# Patient Record
Sex: Female | Born: 1960
Health system: Southern US, Community
[De-identification: ages and names within clinical notes are randomized; demographics above are authoritative.]

## PROBLEM LIST (undated history)

## (undated) DIAGNOSIS — J45909 Unspecified asthma, uncomplicated: Secondary | ICD-10-CM

## (undated) DIAGNOSIS — G43909 Migraine, unspecified, not intractable, without status migrainosus: Secondary | ICD-10-CM

## (undated) DIAGNOSIS — E785 Hyperlipidemia, unspecified: Secondary | ICD-10-CM

## (undated) DIAGNOSIS — T7840XA Allergy, unspecified, initial encounter: Secondary | ICD-10-CM

## (undated) DIAGNOSIS — E119 Type 2 diabetes mellitus without complications: Secondary | ICD-10-CM

## (undated) HISTORY — DX: Migraine, unspecified, not intractable, without status migrainosus: G43.909

## (undated) HISTORY — DX: Unspecified asthma, uncomplicated: J45.909

## (undated) HISTORY — DX: Hyperlipidemia, unspecified: E78.5

## (undated) HISTORY — DX: Allergy, unspecified, initial encounter: T78.40XA

---

## 1989-08-04 HISTORY — PX: HERNIA REPAIR: SHX51

## 2005-04-29 ENCOUNTER — Emergency Department: Payer: Self-pay | Admitting: Emergency Medicine

## 2005-07-17 ENCOUNTER — Ambulatory Visit: Payer: Self-pay | Admitting: Family Medicine

## 2007-11-03 ENCOUNTER — Ambulatory Visit: Payer: Self-pay | Admitting: Family Medicine

## 2008-04-18 ENCOUNTER — Ambulatory Visit: Payer: Self-pay

## 2008-12-06 ENCOUNTER — Ambulatory Visit: Payer: Self-pay | Admitting: Family Medicine

## 2009-11-12 LAB — HM PAP SMEAR: HM Pap smear: NORMAL

## 2009-12-11 ENCOUNTER — Ambulatory Visit: Payer: Self-pay | Admitting: Family Medicine

## 2010-12-17 ENCOUNTER — Ambulatory Visit: Payer: Self-pay | Admitting: Family Medicine

## 2011-08-16 LAB — HM MAMMOGRAPHY: HM Mammogram: NORMAL

## 2011-11-07 LAB — HM PAP SMEAR: HM Pap smear: NORMAL

## 2012-04-07 LAB — FECAL OCCULT BLOOD, GUAIAC: Fecal Occult Blood: NEGATIVE

## 2012-09-07 ENCOUNTER — Encounter: Payer: Self-pay | Admitting: Internal Medicine

## 2012-09-07 ENCOUNTER — Ambulatory Visit (INDEPENDENT_AMBULATORY_CARE_PROVIDER_SITE_OTHER): Payer: BC Managed Care – PPO | Admitting: Internal Medicine

## 2012-09-07 VITALS — BP 132/86 | HR 97 | Temp 97.9°F | Resp 16 | Ht 68.25 in | Wt 236.5 lb

## 2012-09-07 DIAGNOSIS — E669 Obesity, unspecified: Secondary | ICD-10-CM

## 2012-09-07 DIAGNOSIS — J309 Allergic rhinitis, unspecified: Secondary | ICD-10-CM

## 2012-09-07 DIAGNOSIS — Z1239 Encounter for other screening for malignant neoplasm of breast: Secondary | ICD-10-CM

## 2012-09-07 DIAGNOSIS — Z8049 Family history of malignant neoplasm of other genital organs: Secondary | ICD-10-CM

## 2012-09-07 DIAGNOSIS — E785 Hyperlipidemia, unspecified: Secondary | ICD-10-CM

## 2012-09-07 DIAGNOSIS — Z8709 Personal history of other diseases of the respiratory system: Secondary | ICD-10-CM

## 2012-09-07 MED ORDER — BUTALBITAL-APAP-CAFFEINE 50-325-40 MG PO TABS: 1.0000 | ORAL_TABLET | Freq: Four times a day (QID) | ORAL | Status: DC | PRN

## 2012-09-07 MED ORDER — BUTALBITAL-APAP-CAFFEINE 50-325-40 MG PO TABS: 1.0000 | ORAL_TABLET | Freq: Four times a day (QID) | ORAL | Status: AC | PRN

## 2012-09-07 NOTE — Progress Notes (Signed)
Patient ID: Rachel Riggs, female   DOB: 15-Jul-1961, 52 y.o.   MRN: [id number]  Patient Active Problem List  Diagnosis  . History of asthma  . Hyperlipidemia  . Obesity  . Family history of cervical cancer    Subjective:  CC:   Chief Complaint  Patient presents with  . Establish Care    HPI:    Rachel Riggs is a 52 y.o. female who presents as a new patient to establish primary care with several issues to discuss including 1) dizziness ,  Sinus congestion and allergies lately.  Occurring when she is lying down and tilting head back, with acutal vertigo described and Had vertigo.  Started zyrtec  2 weeks .  Last epidsosse 2 years ago resolved with zurtec  2) Headaches for the past week:   frontal headache,  Nausea,  light sensitivity .typical occurrence i 3/month.  History of hives as well.  Headaches occur 3 days before period.   3) wt gain,  Highest was 248 in 2007.  nadir 215 and weight loss was achieved with and exercise regimen 7 days a week and it hurts to weight watchers diet. Currently she not exercising not following her diet. She is working 7 days per week.  Health maintenance: Former patient of Dr Rachel Riggs .  Had an annual exam last year by Rachel Riggs her former PCP .  Mammogram is due.   Last PAP was April or Sept 2014.  No history of recent abnormals.  Remote colposcopy for erosion but biopsy was normal.  Mom was just diagnosed with cervical CA at age 31 .  Past Medical History  Diagnosis Date  . Asthma   . Hyperlipidemia   . Migraines     Past Surgical History  Procedure Date  . Hernia repair 1991    Family History  Problem Relation Age of Onset  . Parkinsonism Mother 57  . Cancer Mother     cervical ca  . Hypertension Father   . Hyperlipidemia Father   . Drug abuse Father   . Cancer Brother 26    prostate CA  . Diabetes Maternal Grandmother   . Stroke Maternal Grandmother   . Heart disease Paternal Grandfather   . COPD Paternal Grandfather      History   Social History  . Marital Status: Married    Spouse Name: N/A    Number of Children: N/A  . Years of Education: N/A   Occupational History  . Not on file.   Social History Main Topics  . Smoking status: Former Smoker    Quit date: 08/04/1984  . Smokeless tobacco: Not on file  . Alcohol Use: 0.6 oz/week    1 Glasses of wine per week  . Drug Use: No  . Sexually Active:    Other Topics Concern  . Not on file   Social History Narrative  . No narrative on file   Allergies  Allergen Reactions  . Amoxicillin Anaphylaxis  . Aspirin Anaphylaxis  . Ibuprofen Anaphylaxis  . Penicillins Anaphylaxis  . Sulfa Antibiotics Anaphylaxis    Review of Systems:   Patient denies , fevers, malaise, unintentional weight loss, skin rash, eye pain, sinus congestion and sinus pain, sore throat, dysphagia,  hemoptysis , cough, dyspnea, wheezing, chest pain, palpitations, orthopnea, edema, abdominal pain, nausea, melena, diarrhea, constipation, flank pain, dysuria, hematuria, urinary  Frequency, nocturia, numbness, tingling, seizures,  Focal weakness, Loss of consciousness,  Tremor, insomnia, depression, anxiety, and suicidal ideation.  Objective:  BP 132/86  Pulse 97  Temp 97.9 F (36.6 C) (Oral)  Resp 16  Ht 5' 8.25" (1.734 m)  Wt 236 lb 8 oz (107.276 kg)  BMI 35.70 kg/m2  SpO2 97%  LMP 08/24/2012  General appearance: alert, cooperative and appears stated age Ears: normal TM's and external ear canals both ears Throat: lips, mucosa, and tongue normal; teeth and gums normal Neck: no adenopathy, no carotid bruit, supple, symmetrical, trachea midline and thyroid not enlarged, symmetric, no tenderness/mass/nodules Back: symmetric, no curvature. ROM normal. No CVA tenderness. Lungs: clear to auscultation bilaterally Heart: regular rate and rhythm, S1, S2 normal, no murmur, click, rub or gallop Abdomen: soft, non-tender; bowel sounds normal; no masses,  no  organomegaly Pulses: 2+ and symmetric Skin: Skin color, texture, turgor normal. No rashes or lesions Lymph nodes: Cervical, supraclavicular, and axillary nodes normal.  Assessment and Plan:  Family history of cervical cancer She is considered high risk due to her personal remote history of cervical erosions and her family history cervical cancer in first-degree relatives. We will continue annual Pap smears. Awaiting results from  Obesity I have addressed  BMI and recommended a low glycemic index diet utilizing smaller more frequent meals to increase metabolism.  I have also recommended that patient start exercising with a goal of 30 minutes of aerobic exercise a minimum of 5 days per week. Screening for lipid disorders, thyroid and diabetes to be Riggs today.    Hyperlipidemia Managed with pravastatin. Goal is LDL less than 1:30. She will return for fasting lipids and CMET  Allergic rhinitis Her symptoms of headache and dizziness in the settting of sinus congestion and history of allergic rhinitis with recent lapse in use of antihistamines appear to be benign in nature.  I agree with her self assessment. Neurologic exam is normal    Updated Medication List Outpatient Encounter Prescriptions as of 09/07/2012  Medication Sig Dispense Refill  . butalbital-acetaminophen-caffeine (FIORICET) 50-325-40 MG per tablet Take 1-2 tablets by mouth every 6 (six) hours as needed for headache.  90 tablet  1  . etodolac (LODINE) 500 MG tablet Take 500 mg by mouth 2 (two) times daily.      Marland Kitchen MICROGESTIN 1-20 MG-MCG tablet Take 1 tablet by mouth Daily.      . pravastatin (PRAVACHOL) 40 MG tablet Take 1 tablet by mouth Daily.      . VENTOLIN HFA 108 (90 BASE) MCG/ACT inhaler Take 2 puffs by mouth Every 6 hours as needed.      . [DISCONTINUED] butalbital-acetaminophen-caffeine (FIORICET) 50-325-40 MG per tablet Take 1-2 tablets by mouth every 6 (six) hours as needed for headache.  90 tablet  1

## 2012-09-07 NOTE — Patient Instructions (Addendum)
Return at your leisure for fasting labs once we call you .   You can lose 10%  Of your current body weight over the next 6 months   This is  my version of a  "Low GI"  Diet:  It is not ultra low carb, but will still lower your blood sugars and allow you to lose 5 to 10 lbs per month if you follow it carefully. All of the foods can be found at grocery stores and in bulk at [employer].  The Atkins protein bars and shakes are available in more varieties at [employer], [employer] and [employer].     7 AM Breakfast:  Low carbohydrate Protein  Shakes (I recommend the EAS AdvantEdge "Carb Control" shakes  Or the low carb shakes by Atkins.   Both are available everywhere:  In  cases at [employer]Js  Or in 4 packs at grocery stores and pharmacies  2.5 carbs  (Alternative is  a toasted Arnold's Sandwhich Thin w/ peanut butter, a "Bagel Thin" with cream cheese and salmon) or  a scrambled egg burrito made with a low carb tortilla .  Avoid cereal and bananas, oatmeal too unless you are cooking the old fashioned kind that takes 30-40 minutes to prepare.  the rest is overly processed, has minimal fiber, and is loaded with carbohydrates!   10 AM: Protein bar by Atkins (the snack size, under 200 cal).  There are many varieties , available widely again or in bulk in limited varieties at BJs)  Other so called "protein bars" tend to be loaded with carbohydrates.  Remember, in food advertising, the word "energy" is synonymous for " carbohydrate."  Lunch: sandwich of [place], (or any lunchmeat, grilled meat or canned tuna), fresh avocado, mayonnaise  and cheese on a lower carbohydrate pita bread, flatbread, or tortilla . Ok to use regular mayonnaise. The bread is the only source or carbohydrate that can be decreased (Joseph's makes a pita bread and a flat bread that are 50 cal and 4 net carbs ; Toufayan makes a low carb flatbread that's 100 cal and 9 net carbs  and  Mission makes a low carb whole wheat tortilla  That is 210 cal and 6 net  carbs)  3 PM:  Mid day :  Another protein bar,  Or a  cheese stick (100 cal, 0 carbs),  Or 1 ounce of  almonds, walnuts, pistachios, pecans, peanuts,  Macadamia nuts. Or a Dannon light n Fit greek yogurt, 80 cal 8 net carbs . Avoid "granola"; the dried cranberries and raisins are loaded with carbohydrates. Mixed nuts ok if no raisins or cranberries or dried fruit.      6 PM  Dinner:  "mean and green:"  Meat/chicken/fish or a high protein legume; , with a green salad, and a low GI  Veggie (broccoli, cauliflower, green beans, spinach, brussel sprouts. Lima beans) : Avoid "Low fat dressings, as well as Reyne Dumas and [address]! They are loaded with sugar! Instead use ranch, vinagrette,  Blue cheese, etc.  There is a low carb pasta by Dreamfield's available at [employer] [facility] that is acceptable and tastes great. Try Michel Angel's chicken piccata over low carb pasta. The chicken dish is 0 carbs, and can be found in frozen section at BJs and [employer]. Also try [employer] "Carnitas" (pulled pork, no sauce,  0 carbs) and his pot roast.   both are in the refrigerated section at BJs   Dreamfield's makes a low carb pasta only  5 g/serving.  Available at all grocery stores,  And tastes like normal pasta  9 PM snack : Breyer's "low carb" fudgsicle or  ice cream bar (Carb Smart line), or  Weight Watcher's ice cream bar , or another "no sugar added" ice cream;a serving of fresh berries/cherries with whipped cream (Avoid bananas, pineapple, grapes  and watermelon on a regular basis because they are high in sugar)   Remember that snack Substitutions should be less than 10 carbs per serving and meals < 20 carbs. Remember to subtract fiber grams and sugar alcohols to get the "net carbs."

## 2012-09-08 ENCOUNTER — Encounter: Payer: Self-pay | Admitting: Internal Medicine

## 2012-09-08 DIAGNOSIS — J309 Allergic rhinitis, unspecified: Secondary | ICD-10-CM | POA: Insufficient documentation

## 2012-09-08 NOTE — Assessment & Plan Note (Signed)
I have addressed  BMI and recommended a low glycemic index diet utilizing smaller more frequent meals to increase metabolism.  I have also recommended that patient start exercising with a goal of 30 minutes of aerobic exercise a minimum of 5 days per week. Screening for lipid disorders, thyroid and diabetes to be done today.   

## 2012-09-08 NOTE — Assessment & Plan Note (Addendum)
Managed with pravastatin. Goal is LDL less than 1:30. She will return for fasting lipids and CMET

## 2012-09-08 NOTE — Assessment & Plan Note (Signed)
Her symptoms of headache and dizziness in the settting of sinus congestion and history of allergic rhinitis with recent lapse in use of antihistamines appear to be benign in nature.  I agree with her self assessment. Neurologic exam is normal

## 2012-09-08 NOTE — Assessment & Plan Note (Signed)
She is considered high risk due to her personal remote history of cervical erosions and her family history cervical cancer in first-degree relatives. We will continue annual Pap smears. Awaiting results from

## 2012-09-09 ENCOUNTER — Other Ambulatory Visit: Payer: Self-pay | Admitting: General Practice

## 2012-09-09 MED ORDER — PRAVASTATIN SODIUM 40 MG PO TABS: ORAL_TABLET | ORAL | Status: DC

## 2012-09-24 ENCOUNTER — Encounter: Payer: Self-pay | Admitting: Internal Medicine

## 2012-09-24 ENCOUNTER — Encounter: Payer: Self-pay | Admitting: General Practice

## 2012-09-28 ENCOUNTER — Ambulatory Visit: Payer: Self-pay | Admitting: Internal Medicine

## 2012-09-29 ENCOUNTER — Other Ambulatory Visit: Payer: BC Managed Care – PPO

## 2012-10-04 ENCOUNTER — Encounter: Payer: Self-pay | Admitting: Internal Medicine

## 2012-10-06 ENCOUNTER — Telehealth: Payer: Self-pay | Admitting: *Deleted

## 2012-10-06 ENCOUNTER — Encounter: Payer: Self-pay | Admitting: Internal Medicine

## 2012-10-06 DIAGNOSIS — E785 Hyperlipidemia, unspecified: Secondary | ICD-10-CM

## 2012-10-06 DIAGNOSIS — Z124 Encounter for screening for malignant neoplasm of cervix: Secondary | ICD-10-CM | POA: Insufficient documentation

## 2012-10-06 DIAGNOSIS — R5381 Other malaise: Secondary | ICD-10-CM

## 2012-10-06 DIAGNOSIS — R5383 Other fatigue: Secondary | ICD-10-CM

## 2012-10-06 DIAGNOSIS — E669 Obesity, unspecified: Secondary | ICD-10-CM

## 2012-10-06 NOTE — Telephone Encounter (Signed)
Pt is coming in tomorrow 03.06.2014 for labs, what labs and dx would you like? Thank you  

## 2012-10-07 ENCOUNTER — Other Ambulatory Visit (INDEPENDENT_AMBULATORY_CARE_PROVIDER_SITE_OTHER): Payer: BC Managed Care – PPO

## 2012-10-07 DIAGNOSIS — R5381 Other malaise: Secondary | ICD-10-CM

## 2012-10-07 DIAGNOSIS — R5383 Other fatigue: Secondary | ICD-10-CM

## 2012-10-07 DIAGNOSIS — E785 Hyperlipidemia, unspecified: Secondary | ICD-10-CM

## 2012-10-07 LAB — COMPREHENSIVE METABOLIC PANEL
ALT: 25 U/L (ref 0–35)
AST: 25 U/L (ref 0–37)
Albumin: 3.7 g/dL (ref 3.5–5.2)
Alkaline Phosphatase: 121 U/L — ABNORMAL HIGH (ref 39–117)
BUN: 11 mg/dL (ref 6–23)
Calcium: 9.4 mg/dL (ref 8.4–10.5)
Chloride: 103 mEq/L (ref 96–112)
Potassium: 4.4 mEq/L (ref 3.5–5.1)

## 2012-10-07 LAB — CBC WITH DIFFERENTIAL/PLATELET
Basophils Relative: 0.4 % (ref 0.0–3.0)
Eosinophils Absolute: 0.2 10*3/uL (ref 0.0–0.7)
Eosinophils Relative: 2.4 % (ref 0.0–5.0)
HCT: 39.3 % (ref 36.0–46.0)
Lymphs Abs: 3 10*3/uL (ref 0.7–4.0)
MCHC: 33.5 g/dL (ref 30.0–36.0)
MCV: 83.5 fl (ref 78.0–100.0)
Monocytes Absolute: 0.5 10*3/uL (ref 0.1–1.0)
Neutrophils Relative %: 57.1 % (ref 43.0–77.0)
RBC: 4.71 Mil/uL (ref 3.87–5.11)
WBC: 8.8 10*3/uL (ref 4.5–10.5)

## 2012-10-07 LAB — LIPID PANEL: Total CHOL/HDL Ratio: 4

## 2012-10-07 LAB — LDL CHOLESTEROL, DIRECT: Direct LDL: 138.8 mg/dL

## 2012-10-13 ENCOUNTER — Encounter: Payer: Self-pay | Admitting: Internal Medicine

## 2012-10-13 ENCOUNTER — Ambulatory Visit (INDEPENDENT_AMBULATORY_CARE_PROVIDER_SITE_OTHER): Payer: BC Managed Care – PPO | Admitting: Internal Medicine

## 2012-10-13 VITALS — BP 130/82 | HR 94 | Temp 98.4°F | Resp 16 | Ht 68.0 in | Wt 229.8 lb

## 2012-10-13 DIAGNOSIS — Z1211 Encounter for screening for malignant neoplasm of colon: Secondary | ICD-10-CM

## 2012-10-13 DIAGNOSIS — Z0000 Encounter for general adult medical examination without abnormal findings: Secondary | ICD-10-CM | POA: Insufficient documentation

## 2012-10-13 DIAGNOSIS — E669 Obesity, unspecified: Secondary | ICD-10-CM

## 2012-10-13 DIAGNOSIS — E785 Hyperlipidemia, unspecified: Secondary | ICD-10-CM

## 2012-10-13 NOTE — Assessment & Plan Note (Addendum)
improved with 8 lbs in one month on low gi diet.   She is thrilled as she has been unable to  Lose weight this easily in years.  Counselling given,

## 2012-10-13 NOTE — Progress Notes (Signed)
Patient ID: Rachel Riggs, female   DOB: [birth year], 52 y.o.   MRN: [id number]    Subjective:     Rachel Riggs is a 52 y.o. female and is here for a comprehensive physical exam. The patient reports no problems.  History   Social History  . Marital Status: Married    Spouse Name: N/A    Number of Children: N/A  . Years of Education: N/A   Occupational History  . Not on file.   Social History Main Topics  . Smoking status: Former Smoker    Quit date: 08/04/1984  . Smokeless tobacco: Never Used  . Alcohol Use: 0.6 oz/week    1 Glasses of wine per week  . Drug Use: No  . Sexually Active: Not on file   Other Topics Concern  . Not on file   Social History Narrative  . No narrative on file   Health Maintenance  Topic Date Due  . Colonoscopy  09/09/2010  . Influenza Vaccine  04/04/2013  . Mammogram  08/15/2013  . Pap Smear  11/07/2014  . Tetanus/tdap  11/18/2020    The following portions of the patient's history were reviewed and updated as appropriate: allergies, current medications, past family history, past medical history, past social history, past surgical history and problem list.  Review of Systems A comprehensive review of systems was negative.   Objective:   BP 130/82  Pulse 94  Temp(Src) 98.4 F (36.9 C) (Oral)  Resp 16  Ht 5\' 8"  (1.727 m)  Wt 229 lb 12 oz (104.214 kg)  BMI 34.94 kg/m2  SpO2 97%  LMP 09/15/2012  General Appearance:    Alert, cooperative, no distress, appears stated age  Head:    Normocephalic, without obvious abnormality, atraumatic  Eyes:    PERRL, conjunctiva/corneas clear, EOM's intact, fundi    benign, both eyes  Ears:    Normal TM's and external ear canals, both ears  Nose:   Nares normal, septum midline, mucosa normal, no drainage    or sinus tenderness  Throat:   Lips, mucosa, and tongue normal; teeth and gums normal  Neck:   Supple, symmetrical, trachea midline, no adenopathy;    thyroid:  no  enlargement/tenderness/nodules; no carotid   bruit or JVD  Back:     Symmetric, no curvature, ROM normal, no CVA tenderness  Lungs:     Clear to auscultation bilaterally, respirations unlabored  Chest Wall:    No tenderness or deformity   Heart:    Regular rate and rhythm, S1 and S2 normal, no murmur, rub   or gallop  Breast Exam:    No tenderness, masses, or nipple abnormality  Abdomen:     Soft, non-tender, bowel sounds active all four quadrants,    no masses, no organomegaly  Genitalia:    Pelvic: cervix normal in appearance, external genitalia normal, no adnexal masses or tenderness, no cervical motion tenderness, rectovaginal septum normal, uterus normal size, shape, and consistency and vagina normal without discharge  Extremities:   Extremities normal, atraumatic, no cyanosis or edema  Pulses:   2+ and symmetric all extremities  Skin:   Skin color, texture, turgor normal, no rashes or lesions  Lymph nodes:   Cervical, supraclavicular, and axillary nodes normal  Neurologic:   CNII-XII intact, normal strength, sensation and reflexes    throughout      Assessment:      Hyperlipidemia LDL 138 trigs 206,  Repeat in  6 months, continue statin  Obesity improved with 8 lbs in one month on low gi diet.   She is thrilled as she has been unable to  Lose weight this easily in years.  Counselling given,   Routine general medical examination at a health care facility Annual exam including breast , and pelvic were done today with PAP smear as she had a normal PAP apil 2013 , will repeat next year     Updated Medication List Outpatient Encounter Prescriptions as of 10/13/2012  Medication Sig Dispense Refill  . butalbital-acetaminophen-caffeine (FIORICET) 50-325-40 MG per tablet Take 1-2 tablets by mouth every 6 (six) hours as needed for headache.  90 tablet  1  . cetirizine (ZYRTEC) 10 MG tablet Take 10 mg by mouth daily as needed for allergies.      Marland Kitchen etodolac (LODINE) 500 MG tablet  Take 500 mg by mouth 2 (two) times daily.      Marland Kitchen MICROGESTIN 1-20 MG-MCG tablet Take 1 tablet by mouth Daily.      . pravastatin (PRAVACHOL) 40 MG tablet Take 1 tablet by mouth Daily.  30 tablet  6  . VENTOLIN HFA 108 (90 BASE) MCG/ACT inhaler Take 2 puffs by mouth Every 6 hours as needed.       No facility-administered encounter medications on file as of 10/13/2012.

## 2012-10-13 NOTE — Assessment & Plan Note (Addendum)
LDL 138 trigs 206,  Repeat in  6 months, continue statin

## 2012-10-14 ENCOUNTER — Encounter: Payer: Self-pay | Admitting: Internal Medicine

## 2012-10-14 NOTE — Assessment & Plan Note (Signed)
Annual exam including breast , and pelvic were done today with PAP smear as she had a normal PAP apil 2013 , will repeat next year

## 2012-10-21 ENCOUNTER — Other Ambulatory Visit: Payer: BC Managed Care – PPO

## 2012-11-09 ENCOUNTER — Encounter: Payer: Self-pay | Admitting: Internal Medicine

## 2012-11-09 MED ORDER — NORETHINDRONE ACET-ETHINYL EST 1-20 MG-MCG PO TABS: 1.0000 | ORAL_TABLET | Freq: Every day | ORAL | Status: DC

## 2012-11-09 NOTE — Telephone Encounter (Signed)
Med filled.  

## 2012-12-23 ENCOUNTER — Telehealth: Payer: Self-pay | Admitting: Internal Medicine

## 2012-12-23 MED ORDER — MECLIZINE HCL 25 MG PO TABS: 25.0000 mg | ORAL_TABLET | Freq: Three times a day (TID) | ORAL | Status: DC | PRN

## 2012-12-23 NOTE — Telephone Encounter (Signed)
Patient having a problem with vertigo and she is wanting something called into the pharmacy. The patient is going out of town and tomorrow and she will follow up with Dr. Darrick Huntsman when she returns.

## 2012-12-23 NOTE — Telephone Encounter (Signed)
Patient notified

## 2012-12-23 NOTE — Telephone Encounter (Signed)
Meclizine setn to pharmacy

## 2012-12-23 NOTE — Telephone Encounter (Signed)
Please advise last OV 10/13/12

## 2013-01-06 ENCOUNTER — Telehealth: Payer: Self-pay | Admitting: *Deleted

## 2013-01-06 NOTE — Telephone Encounter (Signed)
Tell her I am very sorry but I Can't do it.  I cannot prescribe medications for I patient I have never seen.  It will not hurt her daughter to be off of birth control for 12 days .  If she would like to come in on the 16th for labs so we will have them back on the 17th  That would expedite things.  She will need a CMET , TSH and cbc w diff. I cannot order them bc I do not know patient's name.

## 2013-01-06 NOTE — Telephone Encounter (Signed)
Not calling for self but for daughter whom has appointment to be established with you on 01/18/13. Patient reports daughter will run out of birth control before that time and old office will not refill with out physical, patient stated daughter is taking Microgestin since middle school for period regulation, and needs one month supply called into [facility] [address] if possible. Daughter name Rachel Riggs. DOB 04/05/1989 please advise.

## 2013-01-07 NOTE — Telephone Encounter (Signed)
Patient notified and she will wait until her OV

## 2013-01-07 NOTE — Telephone Encounter (Signed)
Left message for patient to call office.  

## 2013-01-10 ENCOUNTER — Ambulatory Visit (INDEPENDENT_AMBULATORY_CARE_PROVIDER_SITE_OTHER): Payer: BC Managed Care – PPO | Admitting: Adult Health

## 2013-01-10 ENCOUNTER — Encounter: Payer: Self-pay | Admitting: Adult Health

## 2013-01-10 VITALS — BP 130/72 | HR 98 | Temp 98.6°F | Resp 12 | Wt 237.5 lb

## 2013-01-10 DIAGNOSIS — J019 Acute sinusitis, unspecified: Secondary | ICD-10-CM | POA: Insufficient documentation

## 2013-01-10 DIAGNOSIS — J329 Chronic sinusitis, unspecified: Secondary | ICD-10-CM

## 2013-01-10 MED ORDER — AZITHROMYCIN 250 MG PO TABS: ORAL_TABLET | ORAL | Status: DC

## 2013-01-10 NOTE — Assessment & Plan Note (Signed)
Symptoms ongoing for greater than one week now with green secretions. Start azithromycin. May use Afrin x3 days for severe congestion. RTC if symptoms are not improved within 3-4 days.

## 2013-01-10 NOTE — Progress Notes (Signed)
  Subjective:    Patient ID: Rachel Riggs, female    DOB: 07-02-1961, 52 y.o.   MRN: [id number]  HPI  Patient is a pleasant 52 y/o female who presents to clinic with sinus symptoms. Symptoms began 1 week ago. Headache, jaw pain, sore throat, cough, green secretions. She has taken robitussin, nyquil with not much improvement. Denies fever or chills.   Current Outpatient Prescriptions on File Prior to Visit  Medication Sig Dispense Refill  . butalbital-acetaminophen-caffeine (FIORICET) 50-325-40 MG per tablet Take 1-2 tablets by mouth every 6 (six) hours as needed for headache.  90 tablet  1  . cetirizine (ZYRTEC) 10 MG tablet Take 10 mg by mouth daily as needed for allergies.      Marland Kitchen etodolac (LODINE) 500 MG tablet Take 500 mg by mouth 2 (two) times daily.      . meclizine (ANTIVERT) 25 MG tablet Take 1 tablet (25 mg total) by mouth 3 (three) times daily as needed.  30 tablet  0  . norethindrone-ethinyl estradiol (MICROGESTIN) 1-20 MG-MCG tablet Take 1 tablet by mouth daily.  1 Package  12  . pravastatin (PRAVACHOL) 40 MG tablet Take 1 tablet by mouth Daily.  30 tablet  6  . VENTOLIN HFA 108 (90 BASE) MCG/ACT inhaler Take 2 puffs by mouth Every 6 hours as needed.       No current facility-administered medications on file prior to visit.    Review of Systems  Constitutional: Negative for fever and chills.  HENT: Positive for congestion, sore throat, rhinorrhea, postnasal drip and sinus pressure.   Respiratory: Positive for cough. Negative for shortness of breath.   Gastrointestinal: Negative.    BP 130/72  Pulse 98  Temp(Src) 98.6 F (37 C) (Oral)  Resp 12  Wt 237 lb 8 oz (107.729 kg)  BMI 36.12 kg/m2  SpO2 98%     Objective:   Physical Exam  HENT:  Head: Normocephalic and atraumatic.  Right Ear: External ear normal.  Left Ear: External ear normal.  Mouth/Throat: No oropharyngeal exudate.  Pharyngeal erythema  Cardiovascular: Normal rate, regular rhythm and normal heart  sounds.  Exam reveals no gallop.   No murmur heard. Pulmonary/Chest: Effort normal and breath sounds normal. No respiratory distress. She has no wheezes. She has no rales.       Assessment & Plan:

## 2013-01-10 NOTE — Patient Instructions (Addendum)
  Start Azithromycin. Two tablets on the first day then 1 tablet daily for the next 4 days.  Drink plenty of fluids.  You can try Afrin for severe congestion for 3 days.

## 2013-01-16 IMAGING — MG MAM DGTL SCREENING MAMMO W/CAD
1 series · 4 of 4 positions shown · non-contrast
Comparison: 12/11/2009, 12/06/2008 and 11/03/2007.

REASON FOR EXAM: SCR MAMMO
COMMENTS:

PROCEDURE:     MAM - MAM DGTL SCREENING MAMMO W/CAD  - December 17, 2010  [DATE]
RESULT:

[R CC · right · 4 of 4 slices shown]
[im 1/4]
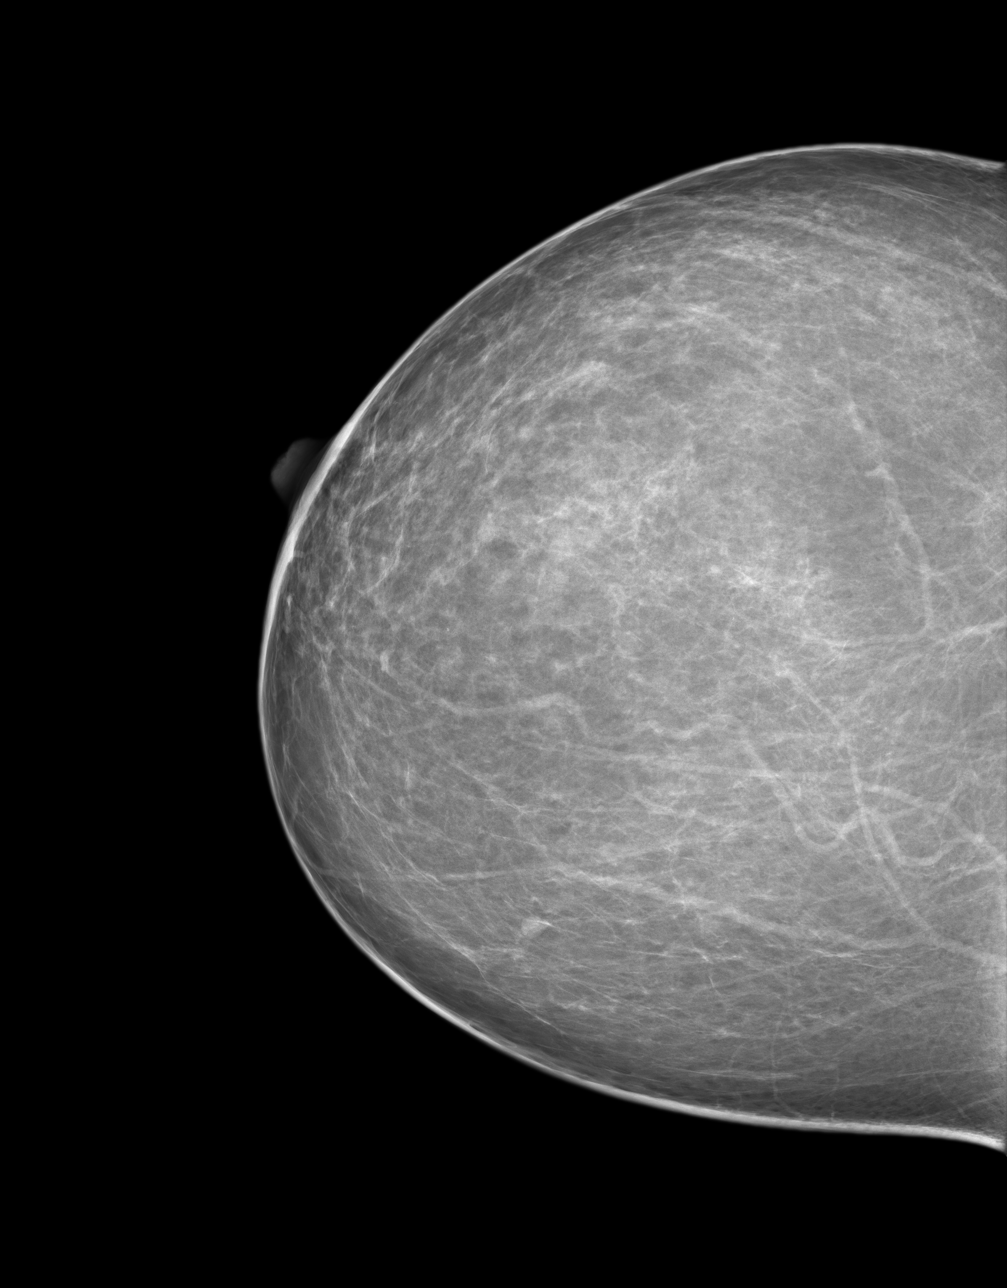
[im 2/4]
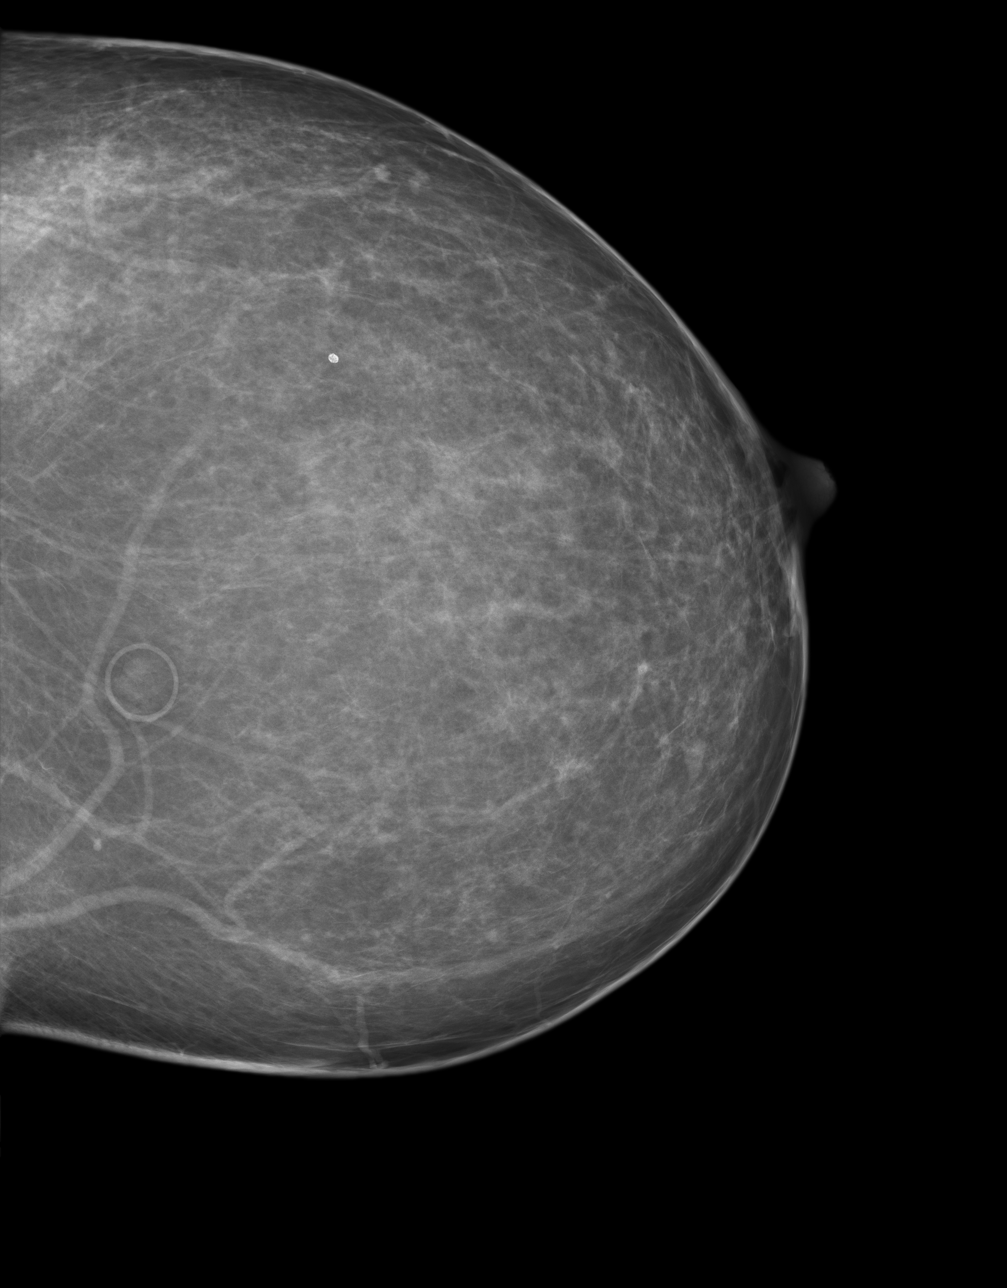
[im 3/4]
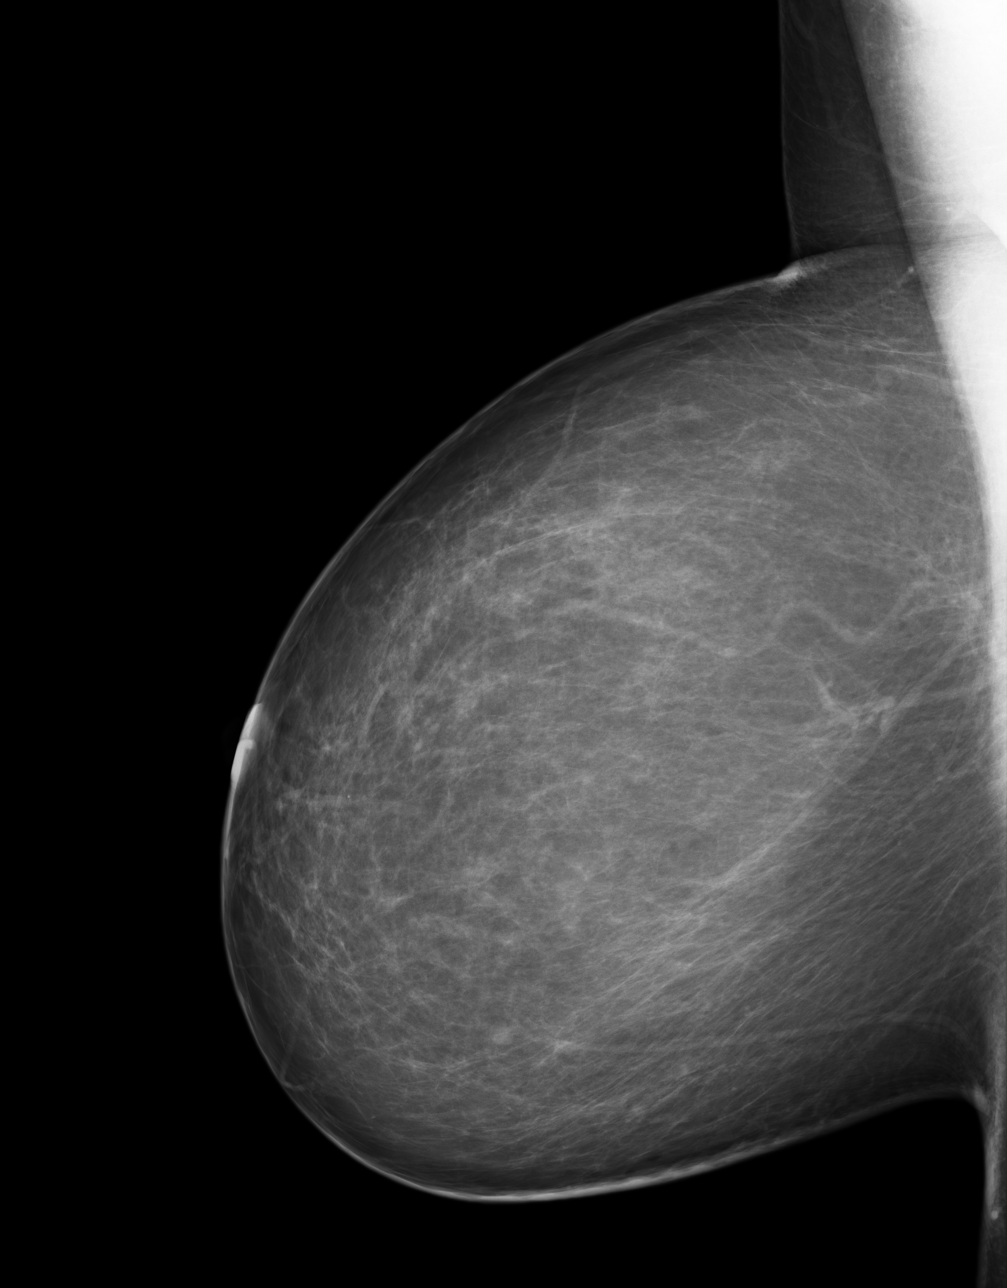
[im 4/4]
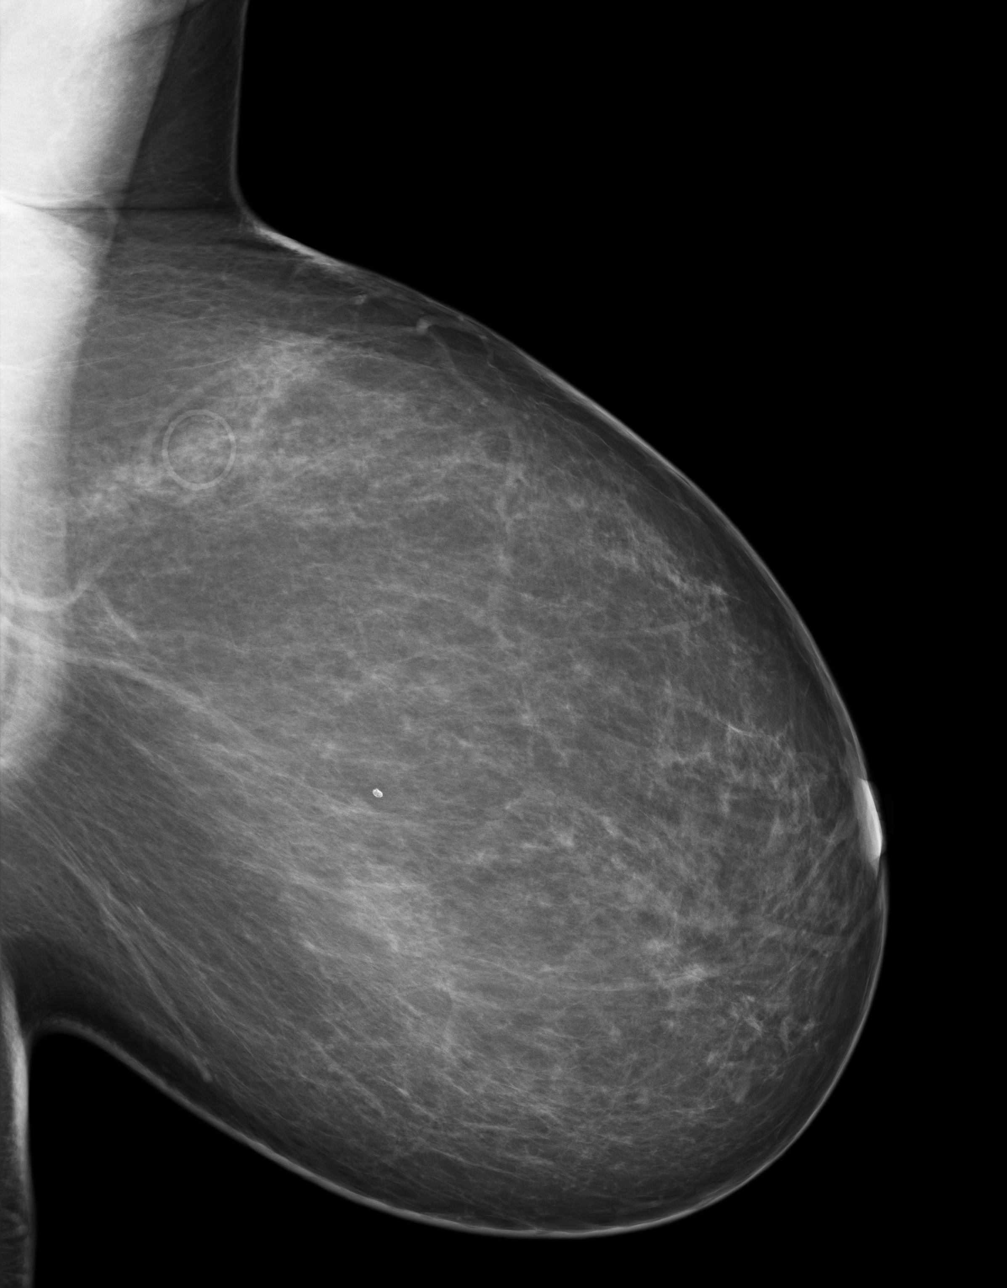

[4 of 4 positions shown; findings below may reference images not displayed]

FINDINGS: There is scattered fibroglandular tissue. No new or suspicious
masses or calcifications are identified. No areas of architectural
distortion.
IMPRESSION: BI-RADS: Category 1 - Negative

RECOMMENDATION:  Continued annual screening mammography.

Thank you for this opportunity to contribute to the care of your patient.

A NEGATIVE MAMMOGRAM REPORT DOES NOT PRECLUDE BIOPSY OR OTHER EVALUATION OF
A CLINICALLY PALPABLE OR OTHERWISE SUSPICIOUS MASS OR LESION. BREAST CANCER
MAY NOT BE DETECTED BY MAMMOGRAPHY IN UP TO 10% OF CASES.

## 2013-03-15 ENCOUNTER — Other Ambulatory Visit: Payer: Self-pay | Admitting: *Deleted

## 2013-03-16 MED ORDER — ALBUTEROL SULFATE HFA 108 (90 BASE) MCG/ACT IN AERS: 2.0000 | INHALATION_SPRAY | Freq: Four times a day (QID) | RESPIRATORY_TRACT | Status: DC | PRN

## 2013-04-12 ENCOUNTER — Telehealth: Payer: Self-pay | Admitting: *Deleted

## 2013-04-12 DIAGNOSIS — Z79899 Other long term (current) drug therapy: Secondary | ICD-10-CM

## 2013-04-12 DIAGNOSIS — E785 Hyperlipidemia, unspecified: Secondary | ICD-10-CM

## 2013-04-12 NOTE — Telephone Encounter (Signed)
Pt is coming in for labs work tomorrow , what labs and dx?

## 2013-04-13 ENCOUNTER — Other Ambulatory Visit (INDEPENDENT_AMBULATORY_CARE_PROVIDER_SITE_OTHER): Payer: BC Managed Care – PPO

## 2013-04-13 DIAGNOSIS — E785 Hyperlipidemia, unspecified: Secondary | ICD-10-CM

## 2013-04-13 DIAGNOSIS — Z79899 Other long term (current) drug therapy: Secondary | ICD-10-CM

## 2013-04-13 DIAGNOSIS — R7301 Impaired fasting glucose: Secondary | ICD-10-CM

## 2013-04-13 LAB — COMPREHENSIVE METABOLIC PANEL
Albumin: 3.4 g/dL — ABNORMAL LOW (ref 3.5–5.2)
Alkaline Phosphatase: 103 U/L (ref 39–117)
BUN: 12 mg/dL (ref 6–23)
CO2: 25 mEq/L (ref 19–32)
Calcium: 9.1 mg/dL (ref 8.4–10.5)
GFR: 103.33 mL/min (ref >60.00)
Glucose, Bld: 142 mg/dL — ABNORMAL HIGH (ref 70–99)
Potassium: 4.3 mEq/L (ref 3.5–5.1)

## 2013-04-13 LAB — LDL CHOLESTEROL, DIRECT: Direct LDL: 130.7 mg/dL

## 2013-04-13 LAB — LIPID PANEL: HDL: 59.3 mg/dL (ref >39.00)

## 2013-04-15 ENCOUNTER — Encounter: Payer: Self-pay | Admitting: Internal Medicine

## 2013-04-15 NOTE — Addendum Note (Signed)
Addended by: Sherlene Shams on: 04/15/2013 06:56 AM   Modules accepted: Orders

## 2013-05-10 ENCOUNTER — Encounter: Payer: Self-pay | Admitting: Internal Medicine

## 2013-05-16 ENCOUNTER — Other Ambulatory Visit: Payer: Self-pay | Admitting: *Deleted

## 2013-05-16 MED ORDER — PRAVASTATIN SODIUM 40 MG PO TABS: ORAL_TABLET | ORAL | Status: DC

## 2013-05-26 ENCOUNTER — Telehealth: Payer: Self-pay | Admitting: Internal Medicine

## 2013-05-26 NOTE — Telephone Encounter (Signed)
Patient Information:  Caller Name: Xaria  Phone: [phone]  Patient: Rachel Riggs, Rachel Riggs  Gender: Female  DOB: [birth year]  Age: 52 Years  PCP: Duncan Dull (Adults only)  Pregnant: No  Office Follow Up:  Does the office need to follow up with this patient?: Yes  Instructions For The Office: Patient states she will not go to another [place] office; she states plan to follow up with appointment on 05/27/13;  if her symptoms get worse she will go to [facility] for evaluation.  Please follow up with her regarding any other instructions or if you can get her a work in appointment.   Symptoms  Reason For Call & Symptoms: Patient calling about right thigh pain/ discomfort.  No swelling. History of varicose veins on left leg.  Pain causes limp, rated at 4-5 of 10.  Denies any recent injury..  Emergent symptoms ruled out.  Go to ED Now or to Office with PCP Approval per Leg Pain guideline due to Thigh or calf pain in only one leg and > 1 hour.  Reviewed Health History In EMR: Yes  Reviewed Medications In EMR: Yes  Reviewed Allergies In EMR: Yes  Reviewed Surgeries / Procedures: Yes  Date of Onset of Symptoms: 05/22/2013  Treatments Tried: Lodine Rx with some relief  Treatments Tried Worked: No OB / GYN:  LMP: Unknown  Guideline(s) Used:  Leg Pain  Disposition Per Guideline:   Go to ED Now (or to Office with PCP Approval)  Reason For Disposition Reached:   Thigh or calf pain in only one leg and present > 1 hour  Advice Given:  N/A  Patient Refused Recommendation:  Patient Refused Appt, Patient Requests Appt At Later Date  Advised may use heat; avoid massage to the area.

## 2013-05-27 ENCOUNTER — Ambulatory Visit (INDEPENDENT_AMBULATORY_CARE_PROVIDER_SITE_OTHER): Payer: BC Managed Care – PPO | Admitting: Adult Health

## 2013-05-27 ENCOUNTER — Ambulatory Visit: Payer: BC Managed Care – PPO | Admitting: Adult Health

## 2013-05-27 ENCOUNTER — Encounter: Payer: Self-pay | Admitting: Adult Health

## 2013-05-27 VITALS — BP 120/76 | HR 96 | Temp 98.3°F | Resp 14 | Wt 235.0 lb

## 2013-05-27 DIAGNOSIS — M79609 Pain in unspecified limb: Secondary | ICD-10-CM

## 2013-05-27 DIAGNOSIS — M79651 Pain in right thigh: Secondary | ICD-10-CM

## 2013-05-27 NOTE — Progress Notes (Signed)
  Subjective:    Patient ID: Rachel Riggs, female    DOB: [birth year], 52 y.o.   MRN: [id number]  HPI  The patient is a pleasant 52 year old female who presents to clinic with complaints of right outer upper thigh pain. She reports that the pain began Sunday. It was at its worse last night. Pain has completely subsided today. She denies injury or at least any recollection of any injury. There is no swelling in comparison to her other thigh. She denies rash, redness at the site. She denied having pain at rest. Her pain was only with movement. She has been taking Lodine twice a day as needed. Patient has history of varicose veins.  Current Outpatient Prescriptions on File Prior to Visit  Medication Sig Dispense Refill  . albuterol (VENTOLIN HFA) 108 (90 BASE) MCG/ACT inhaler Inhale 2 puffs into the lungs every 6 (six) hours as needed for wheezing.  1 Inhaler  2  . azithromycin (ZITHROMAX) 250 MG tablet As directed  6 tablet  0  . butalbital-acetaminophen-caffeine (FIORICET) 50-325-40 MG per tablet Take 1-2 tablets by mouth every 6 (six) hours as needed for headache.  90 tablet  1  . cetirizine (ZYRTEC) 10 MG tablet Take 10 mg by mouth daily as needed for allergies.      Marland Kitchen etodolac (LODINE) 500 MG tablet Take 500 mg by mouth 2 (two) times daily.      . meclizine (ANTIVERT) 25 MG tablet Take 1 tablet (25 mg total) by mouth 3 (three) times daily as needed.  30 tablet  0  . norethindrone-ethinyl estradiol (MICROGESTIN) 1-20 MG-MCG tablet Take 1 tablet by mouth daily.  1 Package  12  . pravastatin (PRAVACHOL) 40 MG tablet Take 1 tablet by mouth Daily.NEEDS APPT FOR FURTHER REFILLS-PLEASE CONTACT OFFICE  30 tablet  0   No current facility-administered medications on file prior to visit.     Review of Systems  Musculoskeletal:       Right upper, outer thigh pain       Objective:   Physical Exam  Constitutional: She appears well-developed and well-nourished. No distress.  Cardiovascular: Normal  rate and regular rhythm.   Pulmonary/Chest: Effort normal. No respiratory distress.  Musculoskeletal: She exhibits no edema.  Slight tenderness with palpation of upper right outer thigh. There is no redness or swelling noted. No swelling in comparison to other thigh. She has excellent peripheral pulses.  Skin: Skin is warm and dry. No rash noted. No erythema.  Psychiatric: She has a normal mood and affect. Her behavior is normal. Judgment and thought content normal.          Assessment & Plan:

## 2013-05-27 NOTE — Assessment & Plan Note (Signed)
Pain occurred only with movement. Suspect muscular. Take Lodine bid regularly for the next 3-4 days. Ice to area. RTC if symptoms worsen or no improvement

## 2013-05-30 ENCOUNTER — Encounter: Payer: Self-pay | Admitting: Internal Medicine

## 2013-05-31 MED ORDER — ETODOLAC 500 MG PO TABS: 500.0000 mg | ORAL_TABLET | Freq: Two times a day (BID) | ORAL | Status: DC

## 2013-05-31 NOTE — Telephone Encounter (Signed)
approved

## 2013-05-31 NOTE — Telephone Encounter (Signed)
Ok to refill 

## 2013-06-01 ENCOUNTER — Other Ambulatory Visit: Payer: Self-pay | Admitting: Internal Medicine

## 2013-06-01 ENCOUNTER — Other Ambulatory Visit (INDEPENDENT_AMBULATORY_CARE_PROVIDER_SITE_OTHER): Payer: BC Managed Care – PPO

## 2013-06-01 DIAGNOSIS — Z1211 Encounter for screening for malignant neoplasm of colon: Secondary | ICD-10-CM

## 2013-06-09 LAB — FECAL OCCULT BLOOD, IMMUNOCHEMICAL: Fecal Occult Bld: NEGATIVE

## 2013-06-11 ENCOUNTER — Encounter: Payer: Self-pay | Admitting: Internal Medicine

## 2013-06-13 ENCOUNTER — Other Ambulatory Visit: Payer: Self-pay | Admitting: Internal Medicine

## 2013-08-12 ENCOUNTER — Telehealth: Payer: Self-pay | Admitting: Internal Medicine

## 2013-08-12 DIAGNOSIS — R5383 Other fatigue: Secondary | ICD-10-CM

## 2013-08-12 DIAGNOSIS — R5381 Other malaise: Secondary | ICD-10-CM

## 2013-08-12 DIAGNOSIS — E785 Hyperlipidemia, unspecified: Secondary | ICD-10-CM

## 2013-08-12 NOTE — Telephone Encounter (Signed)
Labs ordered.

## 2013-08-12 NOTE — Telephone Encounter (Signed)
Please advise to labs I will call and set appointment.

## 2013-08-12 NOTE — Addendum Note (Signed)
Addended by: Sherlene ShamsULLO, TERESA L on: 08/12/2013 05:27 PM   Modules accepted: Orders

## 2013-08-12 NOTE — Telephone Encounter (Signed)
Wanting labs done before her physical in March

## 2013-08-12 NOTE — Telephone Encounter (Signed)
Needing labs put in for her physical in march. Thank you.

## 2013-08-16 NOTE — Telephone Encounter (Signed)
Patient set for labs and verified date of appointment.

## 2013-10-14 ENCOUNTER — Other Ambulatory Visit (INDEPENDENT_AMBULATORY_CARE_PROVIDER_SITE_OTHER): Payer: BC Managed Care – PPO

## 2013-10-14 DIAGNOSIS — E785 Hyperlipidemia, unspecified: Secondary | ICD-10-CM

## 2013-10-14 DIAGNOSIS — R5381 Other malaise: Secondary | ICD-10-CM

## 2013-10-14 DIAGNOSIS — R5383 Other fatigue: Secondary | ICD-10-CM

## 2013-10-14 LAB — CBC WITH DIFFERENTIAL/PLATELET
BASOS ABS: 0 10*3/uL (ref 0.0–0.1)
Basophils Relative: 0.4 % (ref 0.0–3.0)
EOS ABS: 0.2 10*3/uL (ref 0.0–0.7)
Eosinophils Relative: 1.7 % (ref 0.0–5.0)
HEMATOCRIT: 37.4 % (ref 36.0–46.0)
Hemoglobin: 12.4 g/dL (ref 12.0–15.0)
Lymphocytes Relative: 26 % (ref 12.0–46.0)
Lymphs Abs: 2.9 10*3/uL (ref 0.7–4.0)
MCHC: 33.1 g/dL (ref 30.0–36.0)
MCV: 82.8 fl (ref 78.0–100.0)
Monocytes Absolute: 0.7 10*3/uL (ref 0.1–1.0)
Monocytes Relative: 6.6 % (ref 3.0–12.0)
Neutro Abs: 7.3 10*3/uL (ref 1.4–7.7)
Neutrophils Relative %: 65.3 % (ref 43.0–77.0)
PLATELETS: 337 10*3/uL (ref 150.0–400.0)
RBC: 4.51 Mil/uL (ref 3.87–5.11)
RDW: 15.2 % — AB (ref 11.5–14.6)
WBC: 11.2 10*3/uL — ABNORMAL HIGH (ref 4.5–10.5)

## 2013-10-14 LAB — LIPID PANEL
CHOL/HDL RATIO: 3
Cholesterol: 206 mg/dL — ABNORMAL HIGH (ref 0–200)
HDL: 65 mg/dL (ref >39.00)
LDL Cholesterol: 96 mg/dL (ref 0–99)
Triglycerides: 224 mg/dL — ABNORMAL HIGH (ref 0.0–149.0)
VLDL: 44.8 mg/dL — ABNORMAL HIGH (ref 0.0–40.0)

## 2013-10-14 LAB — COMPREHENSIVE METABOLIC PANEL
ALT: 18 U/L (ref 0–35)
AST: 28 U/L (ref 0–37)
Albumin: 3.5 g/dL (ref 3.5–5.2)
Alkaline Phosphatase: 114 U/L (ref 39–117)
BILIRUBIN TOTAL: 0.9 mg/dL (ref 0.3–1.2)
BUN: 13 mg/dL (ref 6–23)
CO2: 26 mEq/L (ref 19–32)
Calcium: 9.2 mg/dL (ref 8.4–10.5)
Chloride: 101 mEq/L (ref 96–112)
Creatinine, Ser: 0.7 mg/dL (ref 0.4–1.2)
GFR: 96.16 mL/min (ref >60.00)
Glucose, Bld: 181 mg/dL — ABNORMAL HIGH (ref 70–99)
Potassium: 4.7 mEq/L (ref 3.5–5.1)
SODIUM: 136 meq/L (ref 135–145)
TOTAL PROTEIN: 7.1 g/dL (ref 6.0–8.3)

## 2013-10-14 LAB — TSH: TSH: 3.35 u[IU]/mL (ref 0.35–5.50)

## 2013-10-18 ENCOUNTER — Encounter: Payer: Self-pay | Admitting: Internal Medicine

## 2013-10-18 DIAGNOSIS — E119 Type 2 diabetes mellitus without complications: Secondary | ICD-10-CM

## 2013-10-18 DIAGNOSIS — E1110 Type 2 diabetes mellitus with ketoacidosis without coma: Secondary | ICD-10-CM

## 2013-10-18 DIAGNOSIS — F411 Generalized anxiety disorder: Secondary | ICD-10-CM

## 2013-10-20 ENCOUNTER — Encounter: Payer: BC Managed Care – PPO | Admitting: Internal Medicine

## 2013-10-20 MED ORDER — METFORMIN HCL 500 MG PO TABS: 500.0000 mg | ORAL_TABLET | Freq: Two times a day (BID) | ORAL | Status: DC

## 2013-10-20 MED ORDER — BUSPIRONE HCL 5 MG PO TABS: 5.0000 mg | ORAL_TABLET | Freq: Three times a day (TID) | ORAL | Status: DC

## 2013-10-21 ENCOUNTER — Encounter: Payer: BC Managed Care – PPO | Admitting: Internal Medicine

## 2013-10-21 LAB — HM DIABETES EYE EXAM

## 2013-10-24 ENCOUNTER — Other Ambulatory Visit: Payer: Self-pay | Admitting: *Deleted

## 2013-10-24 MED ORDER — NORETHINDRONE ACET-ETHINYL EST 1-20 MG-MCG PO TABS: 1.0000 | ORAL_TABLET | Freq: Every day | ORAL | Status: DC

## 2013-11-12 ENCOUNTER — Encounter: Payer: Self-pay | Admitting: Internal Medicine

## 2013-11-24 ENCOUNTER — Encounter: Payer: BC Managed Care – PPO | Admitting: Internal Medicine

## 2013-11-24 ENCOUNTER — Other Ambulatory Visit: Payer: Self-pay | Admitting: Internal Medicine

## 2013-12-19 ENCOUNTER — Other Ambulatory Visit: Payer: Self-pay | Admitting: Internal Medicine

## 2013-12-20 NOTE — Telephone Encounter (Signed)
Appt tomorrow.

## 2013-12-21 ENCOUNTER — Other Ambulatory Visit (HOSPITAL_COMMUNITY)
Admission: RE | Admit: 2013-12-21 | Discharge: 2013-12-21 | Disposition: A | Discharge status: Home / Self Care | Payer: BC Managed Care – PPO | Source: Ambulatory Visit | Attending: Internal Medicine | Admitting: Internal Medicine

## 2013-12-21 ENCOUNTER — Encounter: Payer: Self-pay | Admitting: Internal Medicine

## 2013-12-21 ENCOUNTER — Ambulatory Visit (INDEPENDENT_AMBULATORY_CARE_PROVIDER_SITE_OTHER): Payer: BC Managed Care – PPO | Admitting: Internal Medicine

## 2013-12-21 VITALS — BP 126/74 | HR 88 | Temp 99.0°F | Resp 16 | Ht 68.0 in | Wt 222.2 lb

## 2013-12-21 DIAGNOSIS — Z1151 Encounter for screening for human papillomavirus (HPV): Secondary | ICD-10-CM | POA: Insufficient documentation

## 2013-12-21 DIAGNOSIS — Z124 Encounter for screening for malignant neoplasm of cervix: Secondary | ICD-10-CM

## 2013-12-21 DIAGNOSIS — D72829 Elevated white blood cell count, unspecified: Secondary | ICD-10-CM

## 2013-12-21 DIAGNOSIS — E1169 Type 2 diabetes mellitus with other specified complication: Secondary | ICD-10-CM | POA: Insufficient documentation

## 2013-12-21 DIAGNOSIS — Z01419 Encounter for gynecological examination (general) (routine) without abnormal findings: Secondary | ICD-10-CM | POA: Insufficient documentation

## 2013-12-21 DIAGNOSIS — Z0000 Encounter for general adult medical examination without abnormal findings: Secondary | ICD-10-CM

## 2013-12-21 DIAGNOSIS — R7309 Other abnormal glucose: Secondary | ICD-10-CM

## 2013-12-21 DIAGNOSIS — E669 Obesity, unspecified: Secondary | ICD-10-CM | POA: Insufficient documentation

## 2013-12-21 DIAGNOSIS — E785 Hyperlipidemia, unspecified: Secondary | ICD-10-CM

## 2013-12-21 DIAGNOSIS — Z1239 Encounter for other screening for malignant neoplasm of breast: Secondary | ICD-10-CM

## 2013-12-21 DIAGNOSIS — N912 Amenorrhea, unspecified: Secondary | ICD-10-CM

## 2013-12-21 DIAGNOSIS — E119 Type 2 diabetes mellitus without complications: Secondary | ICD-10-CM

## 2013-12-21 DIAGNOSIS — R739 Hyperglycemia, unspecified: Secondary | ICD-10-CM

## 2013-12-21 DIAGNOSIS — R748 Abnormal levels of other serum enzymes: Secondary | ICD-10-CM

## 2013-12-21 LAB — CBC WITH DIFFERENTIAL/PLATELET
BASOS ABS: 0 10*3/uL (ref 0.0–0.1)
Basophils Relative: 0.3 % (ref 0.0–3.0)
Eosinophils Absolute: 0.1 10*3/uL (ref 0.0–0.7)
Eosinophils Relative: 1.5 % (ref 0.0–5.0)
HCT: 35.5 % — ABNORMAL LOW (ref 36.0–46.0)
HEMOGLOBIN: 11.7 g/dL — AB (ref 12.0–15.0)
LYMPHS ABS: 1.8 10*3/uL (ref 0.7–4.0)
Lymphocytes Relative: 24 % (ref 12.0–46.0)
MCHC: 32.8 g/dL (ref 30.0–36.0)
MCV: 84.2 fl (ref 78.0–100.0)
Monocytes Absolute: 0.4 10*3/uL (ref 0.1–1.0)
Monocytes Relative: 5.2 % (ref 3.0–12.0)
NEUTROS ABS: 5.1 10*3/uL (ref 1.4–7.7)
Neutrophils Relative %: 69 % (ref 43.0–77.0)
PLATELETS: 351 10*3/uL (ref 150.0–400.0)
RBC: 4.22 Mil/uL (ref 3.87–5.11)
RDW: 15.3 % (ref 11.5–15.5)
WBC: 7.4 10*3/uL (ref 4.0–10.5)

## 2013-12-21 LAB — COMPREHENSIVE METABOLIC PANEL
ALBUMIN: 3.4 g/dL — AB (ref 3.5–5.2)
ALT: 37 U/L — AB (ref 0–35)
AST: 76 U/L — AB (ref 0–37)
Alkaline Phosphatase: 102 U/L (ref 39–117)
BUN: 11 mg/dL (ref 6–23)
CALCIUM: 9.5 mg/dL (ref 8.4–10.5)
CHLORIDE: 103 meq/L (ref 96–112)
CO2: 25 mEq/L (ref 19–32)
CREATININE: 0.7 mg/dL (ref 0.4–1.2)
GFR: 94.49 mL/min (ref >60.00)
Glucose, Bld: 182 mg/dL — ABNORMAL HIGH (ref 70–99)
POTASSIUM: 4.5 meq/L (ref 3.5–5.1)
Sodium: 138 mEq/L (ref 135–145)
TOTAL PROTEIN: 6.5 g/dL (ref 6.0–8.3)
Total Bilirubin: 0.7 mg/dL (ref 0.2–1.2)

## 2013-12-21 LAB — LUTEINIZING HORMONE: LH: 2.91 m[IU]/mL

## 2013-12-21 LAB — FOLLICLE STIMULATING HORMONE: FSH: 5.4 m[IU]/mL

## 2013-12-21 LAB — HEMOGLOBIN A1C: Hgb A1c MFr Bld: 7 % — ABNORMAL HIGH (ref 4.6–6.5)

## 2013-12-21 NOTE — Progress Notes (Signed)
Pre-visit discussion using our clinic review tool. No additional management support is needed unless otherwise documented below in the visit note.  

## 2013-12-21 NOTE — Patient Instructions (Signed)
You had your annual  wellness exam today.  We will repeat your PAP smear annually given your FH   Congratulations on the weight loss!!!   [facility]   We will contact you with the bloodwork results

## 2013-12-22 ENCOUNTER — Other Ambulatory Visit: Payer: Self-pay | Admitting: Internal Medicine

## 2013-12-22 ENCOUNTER — Encounter: Payer: Self-pay | Admitting: Internal Medicine

## 2013-12-22 DIAGNOSIS — D72829 Elevated white blood cell count, unspecified: Secondary | ICD-10-CM | POA: Insufficient documentation

## 2013-12-22 DIAGNOSIS — K7581 Nonalcoholic steatohepatitis (NASH): Secondary | ICD-10-CM | POA: Insufficient documentation

## 2013-12-22 DIAGNOSIS — K760 Fatty (change of) liver, not elsewhere classified: Secondary | ICD-10-CM | POA: Insufficient documentation

## 2013-12-22 DIAGNOSIS — N912 Amenorrhea, unspecified: Secondary | ICD-10-CM | POA: Insufficient documentation

## 2013-12-22 DIAGNOSIS — R748 Abnormal levels of other serum enzymes: Secondary | ICD-10-CM | POA: Insufficient documentation

## 2013-12-22 MED ORDER — NORETHINDRONE ACET-ETHINYL EST 1-20 MG-MCG PO TABS: ORAL_TABLET | ORAL | Status: DC

## 2013-12-22 NOTE — Assessment & Plan Note (Signed)
Annual comprehensive exam was done including breast, pelvic and PAP smear. All screenings have been addressed .  

## 2013-12-22 NOTE — Assessment & Plan Note (Addendum)
Etiology unclear.  Patient has tolerated statin foe over a year without enzyme elevation.  Will repeat hepatic panel in a few weeks along with screening for viral and autoimmune syndromes.   Lab Results  Component Value Date   ALT 37* 12/21/2013   AST 76* 12/21/2013   ALKPHOS 102 12/21/2013   BILITOT 0.7 12/21/2013

## 2013-12-22 NOTE — Progress Notes (Signed)
Patient ID: Rachel Riggs, female   DOB: 09/10/1960, 53 y.o.   MRN: [id number] . Subjective:     Rachel Riggs is a 53 y.o. female and is here for a comprehensive physical exam and follow up on DM 2 and hyperlipidemia. The patient reports no  problems - ..  History   Social History  . Marital Status: Married    Spouse Name: N/A    Number of Children: N/A  . Years of Education: N/A   Occupational History  . Not on file.   Social History Main Topics  . Smoking status: Former Smoker    Quit date: 08/04/1984  . Smokeless tobacco: Never Used  . Alcohol Use: 0.6 oz/week    1 Glasses of wine per week  . Drug Use: No  . Sexual Activity: Not on file   Other Topics Concern  . Not on file   Social History Narrative  . No narrative on file   Health Maintenance  Topic Date Due  . Pneumococcal Polysaccharide Vaccine (##1) 09/09/1962  . Foot Exam  09/09/1970  . Ophthalmology Exam  09/09/1970  . Urine Microalbumin  09/09/1970  . Colonoscopy  09/09/2010  . Influenza Vaccine  03/04/2014  . Hemoglobin A1c  06/23/2014  . Mammogram  09/28/2014  . Pap Smear  12/21/2016  . Tetanus/tdap  11/18/2020    The following portions of the patient's history were reviewed and updated as appropriate: allergies, current medications, past family history, past medical history, past social history, past surgical history and problem list.  Review of Systems A comprehensive review of systems was negative.   Objective:   BP 126/74  Pulse 88  Temp(Src) 99 F (37.2 C) (Oral)  Resp 16  Ht 5\' 8"  (1.727 m)  Wt 222 lb 4 oz (100.812 kg)  BMI 33.80 kg/m2  SpO2 98%  LMP 11/30/2013  General Appearance:    Alert, cooperative, no distress, appears stated age  Head:    Normocephalic, without obvious abnormality, atraumatic  Eyes:    PERRL, conjunctiva/corneas clear, EOM's intact, fundi    benign, both eyes  Ears:    Normal TM's and external ear canals, both ears  Nose:   Nares normal, septum  midline, mucosa normal, no drainage    or sinus tenderness  Throat:   Lips, mucosa, and tongue normal; teeth and gums normal  Neck:   Supple, symmetrical, trachea midline, no adenopathy;    thyroid:  no enlargement/tenderness/nodules; no carotid   bruit or JVD  Back:     Symmetric, no curvature, ROM normal, no CVA tenderness  Lungs:     Clear to auscultation bilaterally, respirations unlabored  Chest Wall:    No tenderness or deformity   Heart:    Regular rate and rhythm, S1 and S2 normal, no murmur, rub   or gallop  Breast Exam:    No tenderness, masses, or nipple abnormality  Abdomen:     Soft, non-tender, bowel sounds active all four quadrants,    no masses, no organomegaly  Genitalia:    Pelvic: cervix normal in appearance, external genitalia normal, no adnexal masses or tenderness, no cervical motion tenderness, rectovaginal septum normal, uterus normal size, shape, and consistency and vagina normal without discharge  Extremities:   Extremities normal, atraumatic, no cyanosis or edema  Pulses:   2+ and symmetric all extremities  Skin:   Skin color, texture, turgor normal, no rashes or lesions  Lymph nodes:   Cervical, supraclavicular, and axillary nodes normal  Neurologic:   CNII-XII intact, normal strength, sensation and reflexes    throughout     Assessment and plan:   Obesity I have congratulated her in reduction of   BMI and encouraged  Continued weight loss with goal of 10% of body weigh over the next 6 months using a low glycemic index diet and regular exercise a minimum of 5 days per week.    Type II or unspecified type diabetes mellitus without mention of complication, not stated as uncontrolled Well-controlled.  . Patient is up-to-date on eye exams and foot exam was done today.  There is  no proteinuria on today's micro urinalysis .  Fasting lipids have been reviewed and statin therapy has been advised and updated according to new ACC guidelines based on patient's 10 year  risk of CAD   Lab Results  Component Value Date   HGBA1C 7.0* 12/21/2013   No results found for this basenameConcepcion Elk: MICROALBUR, [id number]   Lab Results  Component Value Date   CHOL 206* 10/14/2013   HDL 65.00 10/14/2013   LDLCALC 96 10/14/2013   LDLDIRECT 130.7 04/13/2013   TRIG 224.0* 10/14/2013   CHOLHDL 3 10/14/2013     Routine general medical examination at a health care facility Annual comprehensive exam was done including breast, pelvic and PAP smear. All screenings have been addressed .   Hyperlipidemia Managed with statin therapy . she has no side effects but liver enzymes are mildly dlevated today.    Will repeat in one month with additional labs and ultrasound and  suspend statin if repeat lfts remain elevated  Lab Results  Component Value Date   CHOL 206* 10/14/2013   HDL 65.00 10/14/2013   LDLCALC 96 10/14/2013   LDLDIRECT 130.7 04/13/2013   TRIG 224.0* 10/14/2013   CHOLHDL 3 10/14/2013   Lab Results  Component Value Date   ALT 37* 12/21/2013   AST 76* 12/21/2013   ALKPHOS 102 12/21/2013   BILITOT 0.7 12/21/2013       Elevated liver enzymes Etiology unclear.  Patient has tolerated statin foe over a year without enzyme elevation.  Will repeat hepatic panel in a few weeks along with screening for viral and autoimmune syndromes.   Lab Results  Component Value Date   ALT 37* 12/21/2013   AST 76* 12/21/2013   ALKPHOS 102 12/21/2013   BILITOT 0.7 12/21/2013    Updated Medication List Outpatient Encounter Prescriptions as of 12/21/2013  Medication Sig  . albuterol (VENTOLIN HFA) 108 (90 BASE) MCG/ACT inhaler Inhale 2 puffs into the lungs every 6 (six) hours as needed for wheezing.  . cetirizine (ZYRTEC) 10 MG tablet Take 10 mg by mouth daily as needed for allergies.  Marland Kitchen. etodolac (LODINE) 500 MG tablet Take 1 tablet (500 mg total) by mouth 2 (two) times daily.  . meclizine (ANTIVERT) 25 MG tablet Take 1 tablet (25 mg total) by mouth 3 (three) times daily as needed.  . metFORMIN  (GLUCOPHAGE) 500 MG tablet Take 1 tablet (500 mg total) by mouth 2 (two) times daily with a meal.  . MICROGESTIN 1-20 MG-MCG tablet TAKE ONE TABLET BY MOUTH ONCE DAILY  **PLEASE KEEP AND SCHEDULE APPOINTMENT TO RECEIVE FUTURE REFILLS**  . [DISCONTINUED] pravastatin (PRAVACHOL) 40 MG tablet TAKE ONE TABLET BY MOUTH ONCE DAILY  . azithromycin (ZITHROMAX) 250 MG tablet As directed  . busPIRone (BUSPAR) 5 MG tablet Take 1 tablet (5 mg total) by mouth 3 (three) times daily. As needed for anxiety

## 2013-12-22 NOTE — Assessment & Plan Note (Addendum)
Managed with statin therapy . she has no side effects but liver enzymes are mildly dlevated today.    Will repeat in one month with additional labs and ultrasound and  suspend statin if repeat lfts remain elevated  Lab Results  Component Value Date   CHOL 206* 10/14/2013   HDL 65.00 10/14/2013   LDLCALC 96 10/14/2013   LDLDIRECT 130.7 04/13/2013   TRIG 224.0* 10/14/2013   CHOLHDL 3 10/14/2013   Lab Results  Component Value Date   ALT 37* 12/21/2013   AST 76* 12/21/2013   ALKPHOS 102 12/21/2013   BILITOT 0.7 12/21/2013

## 2013-12-22 NOTE — Assessment & Plan Note (Signed)
Resolved, no further workup.   Lab Results  Component Value Date   WBC 7.4 12/21/2013   HGB 11.7* 12/21/2013   HCT 35.5* 12/21/2013   MCV 84.2 12/21/2013   PLT 351.0 12/21/2013

## 2013-12-22 NOTE — Assessment & Plan Note (Signed)
Perimenopause vs PCOS discussed. FSH and LH are in normal range.

## 2013-12-22 NOTE — Assessment & Plan Note (Signed)
I have congratulated her in reduction of   BMI and encouraged  Continued weight loss with goal of 10% of body weigh over the next 6 months using a low glycemic index diet and regular exercise a minimum of 5 days per week.    

## 2013-12-22 NOTE — Assessment & Plan Note (Signed)
Well-controlled.  . Patient is up-to-date on eye exams and foot exam was done today.  There is  no proteinuria on today's micro urinalysis .  Fasting lipids have been reviewed and statin therapy has been advised and updated according to new ACC guidelines based on patient's 10 year risk of CAD   Lab Results  Component Value Date   HGBA1C 7.0* 12/21/2013   No results found for this basenameConcepcion Riggs: MICROALBUR, [id number]   Lab Results  Component Value Date   CHOL 206* 10/14/2013   HDL 65.00 10/14/2013   LDLCALC 96 10/14/2013   LDLDIRECT 130.7 04/13/2013   TRIG 224.0* 10/14/2013   CHOLHDL 3 10/14/2013

## 2013-12-23 ENCOUNTER — Encounter: Payer: Self-pay | Admitting: Internal Medicine

## 2014-01-02 ENCOUNTER — Encounter: Payer: Self-pay | Admitting: Internal Medicine

## 2014-01-06 ENCOUNTER — Encounter: Payer: Self-pay | Admitting: Internal Medicine

## 2014-01-06 NOTE — Telephone Encounter (Signed)
Please advise 

## 2014-01-15 ENCOUNTER — Other Ambulatory Visit: Payer: Self-pay | Admitting: Internal Medicine

## 2014-01-24 ENCOUNTER — Ambulatory Visit: Payer: Self-pay | Admitting: Internal Medicine

## 2014-01-24 LAB — HM MAMMOGRAPHY: HM MAMMO: NEGATIVE

## 2014-01-25 ENCOUNTER — Encounter: Payer: Self-pay | Admitting: *Deleted

## 2014-02-18 ENCOUNTER — Other Ambulatory Visit: Payer: Self-pay | Admitting: Internal Medicine

## 2014-04-11 ENCOUNTER — Other Ambulatory Visit: Payer: Self-pay | Admitting: Internal Medicine

## 2014-04-11 NOTE — Telephone Encounter (Signed)
Medication not listed on current med list, last OV 5.20.15.  Please advise refill

## 2014-04-12 NOTE — Telephone Encounter (Signed)
Rx faxed to pharmacy  

## 2014-04-12 NOTE — Telephone Encounter (Signed)
Ok to refill 

## 2014-05-09 ENCOUNTER — Encounter: Payer: Self-pay | Admitting: Internal Medicine

## 2014-05-09 NOTE — Telephone Encounter (Signed)
Immunization record updated.

## 2014-05-15 ENCOUNTER — Encounter: Payer: Self-pay | Admitting: Internal Medicine

## 2014-07-10 ENCOUNTER — Encounter: Payer: Self-pay | Admitting: Internal Medicine

## 2014-07-11 NOTE — Telephone Encounter (Signed)
Requesting referral to GI

## 2014-07-13 ENCOUNTER — Other Ambulatory Visit: Payer: Self-pay | Admitting: Internal Medicine

## 2014-07-13 DIAGNOSIS — K921 Melena: Secondary | ICD-10-CM

## 2014-07-13 DIAGNOSIS — M130 Polyarthritis, unspecified: Secondary | ICD-10-CM

## 2014-08-02 ENCOUNTER — Other Ambulatory Visit: Payer: Self-pay | Admitting: Internal Medicine

## 2014-09-11 ENCOUNTER — Ambulatory Visit (INDEPENDENT_AMBULATORY_CARE_PROVIDER_SITE_OTHER): Payer: BLUE CROSS/BLUE SHIELD | Admitting: Internal Medicine

## 2014-09-11 ENCOUNTER — Encounter: Payer: Self-pay | Admitting: Internal Medicine

## 2014-09-11 VITALS — BP 138/80 | HR 91 | Temp 98.2°F | Resp 16 | Ht 68.0 in | Wt 229.2 lb

## 2014-09-11 DIAGNOSIS — R748 Abnormal levels of other serum enzymes: Secondary | ICD-10-CM

## 2014-09-11 DIAGNOSIS — Z0000 Encounter for general adult medical examination without abnormal findings: Secondary | ICD-10-CM

## 2014-09-11 DIAGNOSIS — E119 Type 2 diabetes mellitus without complications: Secondary | ICD-10-CM

## 2014-09-11 DIAGNOSIS — E785 Hyperlipidemia, unspecified: Secondary | ICD-10-CM

## 2014-09-11 DIAGNOSIS — Z1239 Encounter for other screening for malignant neoplasm of breast: Secondary | ICD-10-CM

## 2014-09-11 DIAGNOSIS — E669 Obesity, unspecified: Secondary | ICD-10-CM

## 2014-09-11 DIAGNOSIS — Z1211 Encounter for screening for malignant neoplasm of colon: Secondary | ICD-10-CM

## 2014-09-11 DIAGNOSIS — Z124 Encounter for screening for malignant neoplasm of cervix: Secondary | ICD-10-CM

## 2014-09-11 DIAGNOSIS — Z23 Encounter for immunization: Secondary | ICD-10-CM

## 2014-09-11 DIAGNOSIS — L509 Urticaria, unspecified: Secondary | ICD-10-CM | POA: Insufficient documentation

## 2014-09-11 LAB — HEPATIC FUNCTION PANEL
ALT: 21 U/L (ref 0–35)
AST: 27 U/L (ref 0–37)
Albumin: 4 g/dL (ref 3.5–5.2)
Alkaline Phosphatase: 119 U/L — ABNORMAL HIGH (ref 39–117)
Bilirubin, Direct: 0.1 mg/dL (ref 0.0–0.3)
Total Bilirubin: 0.5 mg/dL (ref 0.2–1.2)
Total Protein: 7.4 g/dL (ref 6.0–8.3)

## 2014-09-11 LAB — IRON AND TIBC
%SAT: 12 % — ABNORMAL LOW (ref 20–55)
Iron: 67 ug/dL (ref 42–145)
TIBC: 549 ug/dL — ABNORMAL HIGH (ref 250–470)
UIBC: 482 ug/dL — AB (ref 125–400)

## 2014-09-11 LAB — FERRITIN: Ferritin: 37.3 ng/mL (ref 10.0–291.0)

## 2014-09-11 LAB — HEMOGLOBIN A1C: Hgb A1c MFr Bld: 7.2 % — ABNORMAL HIGH (ref 4.6–6.5)

## 2014-09-11 NOTE — Progress Notes (Signed)
Patient ID: Rachel Riggs, female   DOB: 18-Jun-1961, 54 y.o.   MRN: [id number]   Subjective:     Rachel Riggs is a 54 y.o. female and is here for a comprehensive physical exam and follow upu on chronic and acute issues, iIncluding DM type 2 and obesity. The patient reports problems - as follows:.   1) Diet controlled DM. dkagnosed in May   Her Last a1c 7 months  Ago.  She has been been in denial about diagnosis.      2) Obesity :  HAS GAINEd 8 lbs since last visit.  Has no motivation to exercise and is not  Following a low GI  diet.  Worred about Rachel Riggs who is havning multiple signs and symptoms  of f MS.  Rachel Riggs is seeing a clinic doctor for workup at college.   3) Elevated liver enzymes  Did not return for additional labs to rule out autoimmune causes because she stats that use of benadryl causes her liver enzymes to be elevated and she used benadryl recently.   4) Recurrent hives and angioedema. Had an episode of angioedema three weekends ago, that  was not brought on by a food allergy.  Taking benadryl prn last dose last Thursday,  There is a FH of hives and angioedema.  She has had comprehensive workups both in [place] and [place].  Episides occur  Every 3 months since age 28,  First occurrence with swine flu.  Still occurs despite daily zyrtec and prior trial of singulair, Had allergy testing and immunotherapy years ago (for migraines)  And it did not help    HM: PAP normal May 2015 at her last annual preventive exam.  .  Last abnormal one was over 20 yars ago.   Discussed mother's history of uterine CA and the lack of hereditary component ,   History   Social History  . Marital Status: Married    Spouse Name: N/A    Number of Children: N/A  . Years of Education: N/A   Occupational History  . Not on file.   Social History Main Topics  . Smoking status: Former Smoker    Quit date: 08/04/1984  . Smokeless tobacco: Never Used  . Alcohol Use: 0.6 oz/week    1 Glasses of wine  per week  . Drug Use: No  . Sexual Activity: Not on file   Other Topics Concern  . Not on file   Social History Narrative   Health Maintenance  Topic Date Due  . PNEUMOCOCCAL POLYSACCHARIDE VACCINE (1) 09/09/1962  . URINE MICROALBUMIN  09/09/1970  . COLONOSCOPY  09/09/2010  . HEMOGLOBIN A1C  06/23/2014  . OPHTHALMOLOGY EXAM  10/22/2014  . FOOT EXAM  12/22/2014  . INFLUENZA VACCINE  03/05/2015  . MAMMOGRAM  01/25/2016  . PAP SMEAR  12/21/2016  . TETANUS/TDAP  11/18/2020    The following portions of the patient's history were reviewed and updated as appropriate: allergies, current medications, past family history, past medical history, past social history, past surgical history and problem list.  Review of Systems  Patient denies headache, fevers, malaise, unintentional weight loss, skin rash, eye pain, sinus congestion and sinus pain, sore throat, dysphagia,  hemoptysis , cough, dyspnea, wheezing, chest pain, palpitations, orthopnea, edema, abdominal pain, nausea, melena, diarrhea, constipation, flank pain, dysuria, hematuria, urinary  Frequency, nocturia, numbness, tingling, seizures,  Focal weakness, Loss of consciousness,  Tremor, insomnia, depression, anxiety, and suicidal ideation.      Objective:  BP 138/80 mmHg  Pulse 91  Temp(Src) 98.2 F (36.8 C) (Oral)  Resp 16  Ht 5\' 8"  (1.727 m)  Wt 229 lb 4 oz (103.987 kg)  BMI 34.87 kg/m2  SpO2 98%  General appearance: alert, cooperative and appears stated age Head: Normocephalic, without obvious abnormality, atraumatic Eyes: conjunctivae/corneas clear. PERRL, EOM's intact. Fundi benign. Ears: normal TM's and external ear canals both ears Nose: Nares normal. Septum midline. Mucosa normal. No drainage or sinus tenderness. Throat: lips, mucosa, and tongue normal; teeth and gums normal Neck: no adenopathy, no carotid bruit, no JVD, supple, symmetrical, trachea midline and thyroid not enlarged, symmetric, no  tenderness/mass/nodules Lungs: clear to auscultation bilaterally Breasts: pendulous breasts,   no masses or tenderness Heart: regular rate and rhythm, S1, S2 normal, no murmur, click, rub or gallop Abdomen: soft, non-tender; bowel sounds normal; no masses,  no organomegaly Extremities: extremities normal, atraumatic, no cyanosis or edema Pulses: 2+ and symmetric Skin: Skin color, texture, turgor normal. No rashes or lesions Neurologic: Alert and oriented X 3, normal strength and tone. Normal symmetric reflexes. Normal coordination and gait.   .    Assessment and Plan:   Problem List Items Addressed This Visit    Screening for cervical cancer    PAP was normal May 2015. It was not repeated today       Routine general medical examination at a health care facility    patient scheduled her visit too early, so annual exam was done but not billed.        Obesity    I have addressed  BMI and recommended wt loss of 10% of body weigh over the next 6 months using a low glycemic index diet and regular exercise a minimum of 5 days per week.        Hyperlipidemia           Hives of unknown origin    chronic recurrent since age 54. recent episode accompanied by angioedema, resolved with benadryl without airway compromise.       Elevated liver enzymes   Diabetes mellitus without complication - Primary    metformin was started.  Has lost 15 lbs since March 2014. She has regained 7 lbs due to lack of exercise and commitment to diet.  Repeat a1c is now 7.2  .  She has not provided a urine specimen for protein testing  Lab Results  Component Value Date   HGBA1C 7.2* 09/11/2014        Relevant Orders   Hemoglobin A1c (Completed)   Microalbumin / creatinine urine ratio   Lipid panel    Other Visit Diagnoses    Screening for breast cancer        Relevant Orders    MM DIGITAL SCREENING BILATERAL    Colon cancer screening        Relevant Orders    Fecal occult blood, imunochemical     Need for prophylactic vaccination against Streptococcus pneumoniae (pneumococcus)        Relevant Orders    Pneumococcal polysaccharide vaccine 23-valent greater than or equal to 2yo subcutaneous/IM (Completed)      A total of 40 minutes was spent with patient more than half of which was spent in counseling patient on the above mentioned issues , reviewing and explaining recent labs and imaging studies done, and coordination of care.

## 2014-09-11 NOTE — Progress Notes (Signed)
Pre-visit discussion using our clinic review tool. No additional management support is needed unless otherwise documented below in the visit note.  

## 2014-09-11 NOTE — Patient Instructions (Signed)
You DO have Diabetes,  NOT pre diabetes!!  This is  my version of a  "Low GI"  Weight loss Diet:  It will allow you to lose 4 to 8  lbs  per month if you follow it carefully.  Your goal with exercise is a minimum of 30 minutes of aerobic exercise 5 days per week (Walking does not count once it becomes easy!)     All of the foods can be found at grocery stores and in bulk at [employer].  The Atkins protein bars and shakes are available in more varieties at [employer], [employer] and [place].     7 AM Breakfast:  Choose from the following:  Low carbohydrate Protein  Shakes (I recommend the EAS AdvantEdge "Carb Control" shakes, Atkins,  Muscle Milk or Premier Protein shakes  All are < 4  carbs   a scrambled egg/bacon/cheese burrito made with Mission's "carb balance" whole wheat tortilla  (about 10 net carbs )  A slice of home made fritatta (egg based dish without a crust:  google it)    Avoid cereal and bananas, oatmeal and cream of wheat and grits. They are loaded with carbohydrates!   10 AM: high protein snack  Protein bar by Atkins  Or KIND  (the snack size, under 200 cal, usually < 6 net carbs).    A stick of cheese:  Around 1 carb,  100 cal      Other so called "protein bars" and Greek yogurts tend to be loaded with carbohydrates.  Remember, in food advertising, the word "energy" is synonymous for " carbohydrate."  Lunch:   A Sandwich using the bread choices listed, Can use any  Eggs,  lunchmeat, grilled meat or canned tuna), avocado, regular mayo/mustard  and cheese.  A Salad using blue cheese, ranch,  Goddess or vinagrette,  No croutons or "confetti" and no "candied nuts" but regular nuts OK.   No pretzels or chips.  Pickles and miniature sweet peppers are a good low carb alternative that provide a "crunch"  The bread is the only source of carbohydrate in a sandwich and  can be decreased by trying some of these alternatives to traditional loaf bread  Joseph's makes a pita bread and a flat  bread that are 50 cal and 4 net carbs available at [place] and [place].  This can be toasted to use with hummous as well  Toufayan makes a low carb flatbread that's 100 cal and 9 net carbs available at [employer] and [employer]'s makes 2 sizes of  Low carb whole wheat tortilla  (The large one is 210 cal and 6 net carbs)  Flat Out makes flatbreads that are low carb as well  Avoid "Low fat dressings, as well as Barry Brunner and [place] dressings They are loaded with sugar!   3 PM/ Mid day  Snack:  Consider  1 ounce of  almonds, walnuts, pistachios, pecans, peanuts,  Macadamia nuts or a nut medley.  Avoid "granola"; the dried cranberries and raisins are loaded with carbohydrates. Mixed nuts as long as there are no raisins,  cranberries or dried fruit.    Try the prosciutto/mozzarella cheese sticks by [employer]  In deli /backery section   High protein   To avoid overindulging in snacks: Try drinking a glass of unsweeted almond/coconut milk  Or a cup of coffee with your Atkins chocolate bar to keep you from having 3!!!   Pork rinds!  Yes Pork Rinds  6 PM  Dinner:     Meat/fowl/fish with a green salad, and either broccoli, cauliflower, green beans, spinach, brussel sprouts or  Lima beans. DO NOT BREAD THE PROTEIN!!      There is a low carb pasta by Dreamfield's that is acceptable and tastes great: only 5 digestible carbs/serving.( All grocery stores but BJs carry it )  Try Kai Levins Angelo's chicken piccata or chicken or eggplant parm over low carb pasta.(Lowes and BJs)   Clifton Custard Sanchez's "Carnitas" (pulled pork, no sauce,  0 carbs) or his beef pot roast to make a dinner burrito (at [employer]J's)  Pesto over low carb pasta (bj's sells a good quality pesto in the center refrigerated section of the deli   Try satueeing  Roosvelt Harps with mushroooms  Whole wheat pasta is still full of digestible carbs and  Not as low in glycemic index as Dreamfield's.   Brown rice is still rice,  So skip the rice and noodles  if you eat [place] or [place] (or at least limit to 1/2 cup)  9 PM snack :   Breyer's "low carb" fudgsicle or  ice cream bar (Carb Smart line), or  Weight Watcher's ice cream bar , or another "no sugar added" ice cream;  a serving of fresh berries/cherries with whipped cream   Cheese or DANNON'S LlGHT N FIT GREEK YOGURT or the [place] greek yogurt   8 ounces of [place] unsweetened almond/cococunut milk  Cheese and crackers (using WASA crackers,  They are low carb) or peanut butter on low carb crackers or pita bread     Avoid bananas, pineapple, grapes  and watermelon on a regular basis because they are high in sugar.  THINK OF THEM AS DESSERT  Remember that snack Substitutions should be less than 10 NET carbs per serving and meals should be < 20 net carbs. Remember that carbohydrates from fiber do not affect blood sugar, so you can  subtract fiber grams to get the "net carbs " of any particular food item.   Health Maintenance Adopting a healthy lifestyle and getting preventive care can go a long way to promote health and wellness. Talk with your health care provider about what schedule of regular examinations is right for you. This is a good chance for you to check in with your provider about disease prevention and staying healthy. In between checkups, there are plenty of things you can do on your own. Experts have done a lot of research about which lifestyle changes and preventive measures are most likely to keep you healthy. Ask your health care provider for more information. WEIGHT AND DIET  Eat a healthy diet  Be sure to include plenty of vegetables, fruits, low-fat dairy products, and lean protein.  Do not eat a lot of foods high in solid fats, added sugars, or salt.  Get regular exercise. This is one of the most important things you can do for your health.  Most adults should exercise for at least 150 minutes each week. The exercise should increase your heart rate and make you sweat  (moderate-intensity exercise).  Most adults should also do strengthening exercises at least twice a week. This is in addition to the moderate-intensity exercise.  Maintain a healthy weight  Body mass index (BMI) is a measurement that can be used to identify possible weight problems. It estimates body fat based on height and weight. Your health care provider can help determine your BMI and help you achieve or maintain a healthy weight.  For females 54 years of age and older:   A BMI below 18.5 is considered underweight.  A BMI of 18.5 to 24.9 is normal.  A BMI of 25 to 29.9 is considered overweight.  A BMI of 30 and above is considered obese.  Watch levels of cholesterol and blood lipids  You should start having your blood tested for lipids and cholesterol at 54 years of age, then have this test every 5 years.  You may need to have your cholesterol levels checked more often if:  Your lipid or cholesterol levels are high.  You are older than 54 years of age.  You are at high risk for heart disease.  CANCER SCREENING   Lung Cancer  Lung cancer screening is recommended for adults 20-84 years old who are at high risk for lung cancer because of a history of smoking.  A yearly low-dose CT scan of the lungs is recommended for people who:  Currently smoke.  Have quit within the past 15 years.  Have at least a 30-pack-year history of smoking. A pack year is smoking an average of one pack of cigarettes a day for 1 year.  Yearly screening should continue until it has been 15 years since you quit.  Yearly screening should stop if you develop a health problem that would prevent you from having lung cancer treatment.  Breast Cancer  Practice breast self-awareness. This means understanding how your breasts normally appear and feel.  It also means doing regular breast self-exams. Let your health care provider know about any changes, no matter how small.  If you are in your 20s  or 30s, you should have a clinical breast exam (CBE) by a health care provider every 1-3 years as part of a regular health exam.  If you are 17 or older, have a CBE every year. Also consider having a breast X-ray (mammogram) every year.  If you have a family history of breast cancer, talk to your health care provider about genetic screening.  If you are at high risk for breast cancer, talk to your health care provider about having an MRI and a mammogram every year.  Breast cancer gene (BRCA) assessment is recommended for women who have family members with BRCA-related cancers. BRCA-related cancers include:  Breast.  Ovarian.  Tubal.  Peritoneal cancers.  Results of the assessment will determine the need for genetic counseling and BRCA1 and BRCA2 testing. Cervical Cancer Routine pelvic examinations to screen for cervical cancer are no longer recommended for nonpregnant women who are considered low risk for cancer of the pelvic organs (ovaries, uterus, and vagina) and who do not have symptoms. A pelvic examination may be necessary if you have symptoms including those associated with pelvic infections. Ask your health care provider if a screening pelvic exam is right for you.   The Pap test is the screening test for cervical cancer for women who are considered at risk.  If you had a hysterectomy for a problem that was not cancer or a condition that could lead to cancer, then you no longer need Pap tests.  If you are older than 65 years, and you have had normal Pap tests for the past 10 years, you no longer need to have Pap tests.  If you have had past treatment for cervical cancer or a condition that could lead to cancer, you need Pap tests and screening for cancer for at least 20 years after your treatment.  If you no longer get a  Pap test, assess your risk factors if they change (such as having a new sexual partner). This can affect whether you should start being screened again.  Some  women have medical problems that increase their chance of getting cervical cancer. If this is the case for you, your health care provider may recommend more frequent screening and Pap tests.  The human papillomavirus (HPV) test is another test that may be used for cervical cancer screening. The HPV test looks for the virus that can cause cell changes in the cervix. The cells collected during the Pap test can be tested for HPV.  The HPV test can be used to screen women 52 years of age and older. Getting tested for HPV can extend the interval between normal Pap tests from three to five years.  An HPV test also should be used to screen women of any age who have unclear Pap test results.  After 55 years of age, women should have HPV testing as often as Pap tests.  Colorectal Cancer  This type of cancer can be detected and often prevented.  Routine colorectal cancer screening usually begins at 54 years of age and continues through 54 years of age.  Your health care provider may recommend screening at an earlier age if you have risk factors for colon cancer.  Your health care provider may also recommend using home test kits to check for hidden blood in the stool.  A small camera at the end of a tube can be used to examine your colon directly (sigmoidoscopy or colonoscopy). This is done to check for the earliest forms of colorectal cancer.  Routine screening usually begins at age 56.  Direct examination of the colon should be repeated every 5-10 years through 54 years of age. However, you may need to be screened more often if early forms of precancerous polyps or small growths are found. Skin Cancer  Check your skin from head to toe regularly.  Tell your health care provider about any new moles or changes in moles, especially if there is a change in a mole's shape or color.  Also tell your health care provider if you have a mole that is larger than the size of a pencil eraser.  Always use  sunscreen. Apply sunscreen liberally and repeatedly throughout the day.  Protect yourself by wearing long sleeves, pants, a wide-brimmed hat, and sunglasses whenever you are outside. HEART DISEASE, DIABETES, AND HIGH BLOOD PRESSURE   Have your blood pressure checked at least every 1-2 years. High blood pressure causes heart disease and increases the risk of stroke.  If you are between 70 years and 75 years old, ask your health care provider if you should take aspirin to prevent strokes.  Have regular diabetes screenings. This involves taking a blood sample to check your fasting blood sugar level.  If you are at a normal weight and have a low risk for diabetes, have this test once every three years after 54 years of age.  If you are overweight and have a high risk for diabetes, consider being tested at a younger age or more often. PREVENTING INFECTION  Hepatitis B  If you have a higher risk for hepatitis B, you should be screened for this virus. You are considered at high risk for hepatitis B if:  You were born in a country where hepatitis B is common. Ask your health care provider which countries are considered high risk.  Your parents were born in a high-risk country, and  you have not been immunized against hepatitis B (hepatitis B vaccine).  You have HIV or AIDS.  You use needles to inject street drugs.  You live with someone who has hepatitis B.  You have had sex with someone who has hepatitis B.  You get hemodialysis treatment.  You take certain medicines for conditions, including cancer, organ transplantation, and autoimmune conditions. Hepatitis C  Blood testing is recommended for:  Everyone born from [birth year] through 1965.  Anyone with known risk factors for hepatitis C. Sexually transmitted infections (STIs)  You should be screened for sexually transmitted infections (STIs) including gonorrhea and chlamydia if:  You are sexually active and are younger than 54 years of  age.  You are older than 54 years of age and your health care provider tells you that you are at risk for this type of infection.  Your sexual activity has changed since you were last screened and you are at an increased risk for chlamydia or gonorrhea. Ask your health care provider if you are at risk.  If you do not have HIV, but are at risk, it may be recommended that you take a prescription medicine daily to prevent HIV infection. This is called pre-exposure prophylaxis (PrEP). You are considered at risk if:  You are sexually active and do not regularly use condoms or know the HIV status of your partner(s).  You take drugs by injection.  You are sexually active with a partner who has HIV. Talk with your health care provider about whether you are at high risk of being infected with HIV. If you choose to begin PrEP, you should first be tested for HIV. You should then be tested every 3 months for as long as you are taking PrEP.  PREGNANCY   If you are premenopausal and you may become pregnant, ask your health care provider about preconception counseling.  If you may become pregnant, take 400 to 800 micrograms (mcg) of folic acid every day.  If you want to prevent pregnancy, talk to your health care provider about birth control (contraception). OSTEOPOROSIS AND MENOPAUSE   Osteoporosis is a disease in which the bones lose minerals and strength with aging. This can result in serious bone fractures. Your risk for osteoporosis can be identified using a bone density scan.  If you are 75 years of age or older, or if you are at risk for osteoporosis and fractures, ask your health care provider if you should be screened.  Ask your health care provider whether you should take a calcium or vitamin D supplement to lower your risk for osteoporosis.  Menopause may have certain physical symptoms and risks.  Hormone replacement therapy may reduce some of these symptoms and risks. Talk to your health  care provider about whether hormone replacement therapy is right for you.  HOME CARE INSTRUCTIONS   Schedule regular health, dental, and eye exams.  Stay current with your immunizations.   Do not use any tobacco products including cigarettes, chewing tobacco, or electronic cigarettes.  If you are pregnant, do not drink alcohol.  If you are breastfeeding, limit how much and how often you drink alcohol.  Limit alcohol intake to no more than 1 drink per day for nonpregnant women. One drink equals 12 ounces of beer, 5 ounces of wine, or 1 ounces of hard liquor.  Do not use street drugs.  Do not share needles.  Ask your health care provider for help if you need support or information about quitting drugs.  Tell your health care provider if you often feel depressed.  Tell your health care provider if you have ever been abused or do not feel safe at home. Document Released: 02/03/2011 Document Revised: 12/05/2013 Document Reviewed: 06/22/2013 ExitCare Patient Information 2015 [employer]. This information is not intended to replace advice given to you by your health care provider. Make sure you discuss any questions you have with your health care provider.  

## 2014-09-11 NOTE — Assessment & Plan Note (Signed)
PAP was normal May 2015. It was not repeated today

## 2014-09-11 NOTE — Assessment & Plan Note (Addendum)
metformin was started.  Has lost 15 lbs since March 2014. She has regained 7 lbs due to lack of exercise and commitment to diet.  Repeat a1c is now 7.2  .  She has not provided a urine specimen for protein testing  Lab Results  Component Value Date   HGBA1C 7.2* 09/11/2014

## 2014-09-11 NOTE — Assessment & Plan Note (Addendum)
patient scheduled her visit too early, so annual exam was done but not billed.

## 2014-09-11 NOTE — Assessment & Plan Note (Signed)
I have addressed  BMI and recommended wt loss of 10% of body weigh over the next 6 months using a low glycemic index diet and regular exercise a minimum of 5 days per week.   

## 2014-09-11 NOTE — Assessment & Plan Note (Signed)
chronic recurrent since age 54. recent episode accompanied by angioedema, resolved with benadryl without airway compromise.

## 2014-09-12 LAB — HEPATITIS B CORE ANTIBODY, TOTAL: HEP B C TOTAL AB: NONREACTIVE

## 2014-09-12 LAB — HEPATITIS B SURFACE ANTIBODY,QUALITATIVE: Hep B S Ab: NEGATIVE

## 2014-09-12 LAB — ANA: Anti Nuclear Antibody(ANA): NEGATIVE

## 2014-09-12 LAB — HEPATITIS B SURFACE ANTIGEN: HEP B S AG: NEGATIVE

## 2014-09-12 LAB — ANTI-SMITH ANTIBODY: ENA SM AB SER-ACNC: NEGATIVE

## 2014-09-12 LAB — HEPATITIS C ANTIBODY: HCV AB: NEGATIVE

## 2014-09-14 ENCOUNTER — Encounter: Payer: Self-pay | Admitting: Internal Medicine

## 2014-09-14 DIAGNOSIS — R748 Abnormal levels of other serum enzymes: Secondary | ICD-10-CM

## 2014-09-14 DIAGNOSIS — D509 Iron deficiency anemia, unspecified: Secondary | ICD-10-CM | POA: Insufficient documentation

## 2014-09-25 ENCOUNTER — Other Ambulatory Visit: Payer: Self-pay | Admitting: Internal Medicine

## 2014-09-25 DIAGNOSIS — E119 Type 2 diabetes mellitus without complications: Secondary | ICD-10-CM

## 2014-09-25 NOTE — Telephone Encounter (Signed)
Patient has not had Lipid since 3/15, in office 09/11/14 but lipid panel was not done ok to fill?

## 2014-09-26 NOTE — Telephone Encounter (Signed)
90 day supply authorized and sent  please call patient and set up 3 month follow up on  Diabetes,  With fasting labs to be done On or after may 15 th,  Prior to OV

## 2014-11-06 ENCOUNTER — Other Ambulatory Visit: Payer: Self-pay | Admitting: Internal Medicine

## 2014-11-17 ENCOUNTER — Other Ambulatory Visit: Payer: Self-pay | Admitting: Internal Medicine

## 2014-11-17 DIAGNOSIS — Z1231 Encounter for screening mammogram for malignant neoplasm of breast: Secondary | ICD-10-CM

## 2014-12-20 ENCOUNTER — Other Ambulatory Visit: Payer: Self-pay | Admitting: Internal Medicine

## 2014-12-26 ENCOUNTER — Ambulatory Visit (INDEPENDENT_AMBULATORY_CARE_PROVIDER_SITE_OTHER): Payer: BLUE CROSS/BLUE SHIELD | Admitting: Nurse Practitioner

## 2014-12-26 VITALS — BP 138/72 | HR 96 | Temp 98.8°F | Resp 16 | Ht 68.0 in | Wt 235.4 lb

## 2014-12-26 DIAGNOSIS — R21 Rash and other nonspecific skin eruption: Secondary | ICD-10-CM | POA: Diagnosis not present

## 2014-12-26 MED ORDER — PREDNISONE 10 MG PO TABS: ORAL_TABLET | ORAL | Status: DC

## 2014-12-26 NOTE — Progress Notes (Signed)
Pre visit review using our clinic review tool, if applicable. No additional management support is needed unless otherwise documented below in the visit note. 

## 2014-12-26 NOTE — Progress Notes (Signed)
   Subjective:    Patient ID: Rachel Riggs, female    DOB: 06/07/1961, 54 y.o.   MRN: [id number]  HPI  [name] is a 54 yo female with a CC of rash x 1 year.    1) Right ankle rash growing in size and described as Itchy, flaky, "pimply".   Treatment to date:  Triamcinolone 0.1%  Anti-Fungal Antibiotic cream- triple antibiotic ointment  Benadryl Regular lotion   Review of Systems  Constitutional: Negative for fever, chills, diaphoresis and fatigue.  HENT: Negative for sore throat and trouble swallowing.   Eyes: Negative for visual disturbance.  Respiratory: Negative for cough, chest tightness and wheezing.   Cardiovascular: Negative for chest pain, palpitations and leg swelling.  Gastrointestinal: Negative for nausea, vomiting and diarrhea.  Skin: Positive for rash.      Objective:   Physical Exam  Constitutional: She appears well-developed and well-nourished. No distress.  BP 138/72 mmHg  Pulse 96  Temp(Src) 98.8 F (37.1 C)  Resp 16  Ht 5\' 8"  (1.727 m)  Wt 235 lb 6.4 oz (106.777 kg)  BMI 35.80 kg/m2  SpO2 98%   HENT:  Head: Normocephalic and atraumatic.  Right Ear: External ear normal.  Left Ear: External ear normal.  Cardiovascular: Normal rate and regular rhythm.   Skin: Skin is warm and dry. Rash noted. She is not diaphoretic.     Psychiatric: She has a normal mood and affect. Her behavior is normal. Judgment and thought content normal.      Assessment & Plan:

## 2014-12-26 NOTE — Patient Instructions (Signed)
Prednisone with breakfast  6 tablets on day 1, 5 tablets on day 2, 4 tablets on day 3, 3 tablets on day 4, 2 tablets day 5, 1 tablet on day 6...done!   Call or MyChart me next week to tell us how it helped (or didn't help).

## 2015-01-09 ENCOUNTER — Encounter: Payer: Self-pay | Admitting: Nurse Practitioner

## 2015-01-10 ENCOUNTER — Encounter: Payer: Self-pay | Admitting: Nurse Practitioner

## 2015-01-10 DIAGNOSIS — R21 Rash and other nonspecific skin eruption: Secondary | ICD-10-CM | POA: Insufficient documentation

## 2015-01-14 ENCOUNTER — Other Ambulatory Visit: Payer: Self-pay | Admitting: Internal Medicine

## 2015-01-18 ENCOUNTER — Other Ambulatory Visit: Payer: Self-pay | Admitting: Internal Medicine

## 2015-01-18 NOTE — Telephone Encounter (Signed)
Ok to refill,  printed rx  

## 2015-01-18 NOTE — Telephone Encounter (Signed)
Last visit 12/26/14, refill?

## 2015-01-19 NOTE — Telephone Encounter (Signed)
Faxed to pharmacy

## 2015-01-26 ENCOUNTER — Ambulatory Visit
Admission: RE | Admit: 2015-01-26 | Discharge: 2015-01-26 | Disposition: A | Discharge status: Home / Self Care | Payer: BLUE CROSS/BLUE SHIELD | Source: Ambulatory Visit | Attending: Internal Medicine | Admitting: Internal Medicine

## 2015-01-26 DIAGNOSIS — Z1231 Encounter for screening mammogram for malignant neoplasm of breast: Secondary | ICD-10-CM | POA: Diagnosis present

## 2015-01-26 DIAGNOSIS — Z1239 Encounter for other screening for malignant neoplasm of breast: Secondary | ICD-10-CM

## 2015-02-04 ENCOUNTER — Other Ambulatory Visit: Payer: Self-pay | Admitting: Internal Medicine

## 2015-02-11 ENCOUNTER — Other Ambulatory Visit: Payer: Self-pay | Admitting: Internal Medicine

## 2015-03-11 ENCOUNTER — Other Ambulatory Visit: Payer: Self-pay | Admitting: Internal Medicine

## 2015-03-20 ENCOUNTER — Encounter: Payer: Self-pay | Admitting: Nurse Practitioner

## 2015-03-22 ENCOUNTER — Other Ambulatory Visit: Payer: Self-pay | Admitting: Nurse Practitioner

## 2015-03-22 MED ORDER — SCOPOLAMINE 1 MG/3DAYS TD PT72: 1.0000 | MEDICATED_PATCH | TRANSDERMAL | Status: DC

## 2015-05-05 ENCOUNTER — Other Ambulatory Visit: Payer: Self-pay | Admitting: Internal Medicine

## 2015-06-03 ENCOUNTER — Other Ambulatory Visit: Payer: Self-pay | Admitting: Internal Medicine

## 2015-06-04 NOTE — Telephone Encounter (Signed)
Please advise refill? 

## 2015-06-06 NOTE — Telephone Encounter (Signed)
Ok to refill,  Refill sent  

## 2015-06-26 ENCOUNTER — Ambulatory Visit (INDEPENDENT_AMBULATORY_CARE_PROVIDER_SITE_OTHER): Payer: BLUE CROSS/BLUE SHIELD | Admitting: Internal Medicine

## 2015-06-26 ENCOUNTER — Encounter: Payer: Self-pay | Admitting: Internal Medicine

## 2015-06-26 VITALS — BP 142/84 | HR 84 | Temp 98.2°F | Resp 12 | Ht 68.0 in | Wt 226.0 lb

## 2015-06-26 DIAGNOSIS — R300 Dysuria: Secondary | ICD-10-CM

## 2015-06-26 DIAGNOSIS — E669 Obesity, unspecified: Secondary | ICD-10-CM

## 2015-06-26 DIAGNOSIS — E119 Type 2 diabetes mellitus without complications: Secondary | ICD-10-CM

## 2015-06-26 DIAGNOSIS — E785 Hyperlipidemia, unspecified: Secondary | ICD-10-CM

## 2015-06-26 DIAGNOSIS — D509 Iron deficiency anemia, unspecified: Secondary | ICD-10-CM

## 2015-06-26 DIAGNOSIS — N390 Urinary tract infection, site not specified: Secondary | ICD-10-CM | POA: Insufficient documentation

## 2015-06-26 DIAGNOSIS — F411 Generalized anxiety disorder: Secondary | ICD-10-CM

## 2015-06-26 DIAGNOSIS — R748 Abnormal levels of other serum enzymes: Secondary | ICD-10-CM | POA: Diagnosis not present

## 2015-06-26 DIAGNOSIS — N3000 Acute cystitis without hematuria: Secondary | ICD-10-CM

## 2015-06-26 LAB — LIPID PANEL
CHOL/HDL RATIO: 3
Cholesterol: 206 mg/dL — ABNORMAL HIGH (ref 0–200)
HDL: 63.8 mg/dL (ref >39.00)
LDL Cholesterol: 111 mg/dL — ABNORMAL HIGH (ref 0–99)
NonHDL: 142.65
TRIGLYCERIDES: 156 mg/dL — AB (ref 0.0–149.0)
VLDL: 31.2 mg/dL (ref 0.0–40.0)

## 2015-06-26 LAB — COMPREHENSIVE METABOLIC PANEL
ALBUMIN: 4 g/dL (ref 3.5–5.2)
ALT: 19 U/L (ref 0–35)
AST: 27 U/L (ref 0–37)
Alkaline Phosphatase: 134 U/L — ABNORMAL HIGH (ref 39–117)
BILIRUBIN TOTAL: 0.5 mg/dL (ref 0.2–1.2)
BUN: 14 mg/dL (ref 6–23)
CALCIUM: 9.7 mg/dL (ref 8.4–10.5)
CO2: 25 meq/L (ref 19–32)
CREATININE: 0.6 mg/dL (ref 0.40–1.20)
Chloride: 100 mEq/L (ref 96–112)
GFR: 110.4 mL/min (ref >60.00)
Glucose, Bld: 137 mg/dL — ABNORMAL HIGH (ref 70–99)
Potassium: 4.6 mEq/L (ref 3.5–5.1)
Sodium: 135 mEq/L (ref 135–145)
Total Protein: 7.6 g/dL (ref 6.0–8.3)

## 2015-06-26 LAB — POCT URINALYSIS DIPSTICK
BILIRUBIN UA: NEGATIVE
Blood, UA: NEGATIVE
Glucose, UA: NEGATIVE
Ketones, UA: NEGATIVE
NITRITE UA: NEGATIVE
PH UA: 5.5
Protein, UA: 30
Spec Grav, UA: 1.02
Urobilinogen, UA: 1

## 2015-06-26 LAB — HEMOGLOBIN A1C: Hgb A1c MFr Bld: 7.1 % — ABNORMAL HIGH (ref 4.6–6.5)

## 2015-06-26 MED ORDER — BUSPIRONE HCL 7.5 MG PO TABS
7.5000 mg | ORAL_TABLET | Freq: Three times a day (TID) | ORAL | Status: DC
Start: 2015-06-26 — End: 2017-02-02

## 2015-06-26 MED ORDER — CIPROFLOXACIN HCL 250 MG PO TABS: 250.0000 mg | ORAL_TABLET | Freq: Two times a day (BID) | ORAL | Status: DC

## 2015-06-26 MED ORDER — ATORVASTATIN CALCIUM 20 MG PO TABS: 20.0000 mg | ORAL_TABLET | Freq: Every day | ORAL | Status: DC

## 2015-06-26 NOTE — Assessment & Plan Note (Signed)
I have congratulated her in reduction of   BMI and encouraged  Continued weight loss with goal of 10% of body weigh over the next 6 months using a low glycemic index diet and regular exercise a minimum of 5 days per week.    

## 2015-06-26 NOTE — Assessment & Plan Note (Addendum)
Not at goal on current statin therapy . she has no side effects and  liver enzymes are stable.  Will change statin to higher potency atorvastatin .  Lab Results  Component Value Date   CHOL 206* 06/26/2015   HDL 63.80 06/26/2015   LDLCALC 111* 06/26/2015   LDLDIRECT 130.7 04/13/2013   TRIG 156.0* 06/26/2015   CHOLHDL 3 06/26/2015   Lab Results  Component Value Date   ALT 19 06/26/2015   AST 27 06/26/2015   ALKPHOS 134* 06/26/2015   BILITOT 0.5 06/26/2015

## 2015-06-26 NOTE — Addendum Note (Signed)
Addended by: Sherlene ShamsULLO, Norris Brumbach L on: 06/26/2015 08:58 PM   Modules accepted: Orders, Medications, SmartSet

## 2015-06-26 NOTE — Assessment & Plan Note (Signed)
Symptomatic.  Empiric cipro prescribed

## 2015-06-26 NOTE — Assessment & Plan Note (Signed)
Triggered by death of mother and increasing concern over her father's economic and emotional wellbeing . Given her concern over weight gain with SSRI,  Trial of buspirone discussed.

## 2015-06-26 NOTE — Progress Notes (Addendum)
Subjective:  Patient ID: Rachel Riggs, female    DOB: 09/30/1960  Age: 54 y.o. MRN: [id number]  CC: The primary encounter diagnosis was Dysuria. Diagnoses of Diabetes mellitus without complication, Elevated liver enzymes, Hyperlipidemia, Anemia, iron deficiency, Diabetes mellitus without complication ([place]), Obesity, Acute cystitis without hematuria, and Generalized anxiety disorder were also pertinent to this visit.  HPI Rachel Riggs presents for follow up on chronic issues and evaluation of dysuria.  Symptoms started 2 days ago,  Accompanied by frequency.   No hematuria.  mild right CVA pain aggravated by twisting and bending .  2) obesity;  In the interim since last visit she gained weight but then lost more , net loss of 9 lbs .  Not exercising ,  Working two jobs.  3) Grief: mother died in 04-28-23 of Parkinson's Disease .  She has had increased irritability  and less tolerance for petty complaints of others. Recognizes that she needs medication to get her emotions under control, but doesn't want anything that will make her gain weight.   4) DM : managed with metformin  Following a low GI diet and checking sugars infrequently.  No  Neuropathy.   Outpatient Prescriptions Prior to Visit  Medication Sig Dispense Refill  . albuterol (VENTOLIN HFA) 108 (90 BASE) MCG/ACT inhaler Inhale 2 puffs into the lungs every 6 (six) hours as needed for wheezing. 1 Inhaler 2  . butalbital-acetaminophen-caffeine (FIORICET, ESGIC) 50-325-40 MG per tablet TAKE ONE TO TWO TABLETS BY MOUTH EVERY 6 HOURS AS NEEDED FOR HEADACHE 90 tablet 2  . cetirizine (ZYRTEC) 10 MG tablet Take 10 mg by mouth daily as needed for allergies.    Marland Kitchen etodolac (LODINE) 500 MG tablet Take 1 tablet (500 mg total) by mouth 2 (two) times daily. 60 tablet 1  . meclizine (ANTIVERT) 25 MG tablet Take 1 tablet (25 mg total) by mouth 3 (three) times daily as needed. 30 tablet 0  . metFORMIN (GLUCOPHAGE) 500 MG tablet TAKE ONE TABLET BY  MOUTH TWICE DAILY WITH  A  MEAL 180 tablet 1  . MICROGESTIN 1-20 MG-MCG tablet TAKE ONE TABLET BY MOUTH ONCE DAILY 21 tablet 11  . pravastatin (PRAVACHOL) 40 MG tablet TAKE ONE TABLET BY MOUTH ONCE DAILY 90 tablet 1  . scopolamine (TRANSDERM-SCOP, 1.5 MG,) 1 MG/3DAYS Place 1 patch (1.5 mg total) onto the skin every 3 (three) days. (Patient not taking: Reported on 06/26/2015) 10 patch 0  . predniSONE (DELTASONE) 10 MG tablet Take 6 tablets by mouth on day 1 with breakfast then decrease by 1 tablet each day until gone. (Patient not taking: Reported on 06/26/2015) 21 tablet 0   No facility-administered medications prior to visit.    Review of Systems;  Patient denies headache, fevers, malaise, unintentional weight loss, skin rash, eye pain, sinus congestion and sinus pain, sore throat, dysphagia,  hemoptysis , cough, dyspnea, wheezing, chest pain, palpitations, orthopnea, edema, abdominal pain, nausea, melena, diarrhea, constipation, flank pain, dysuria, hematuria, urinary  Frequency, nocturia, numbness, tingling, seizures,  Focal weakness, Loss of consciousness,  Tremor, insomnia, depression, anxiety, and suicidal ideation.      Objective:  BP 142/84 mmHg  Pulse 84  Temp(Src) 98.2 F (36.8 C) (Oral)  Resp 12  Ht _0  (1.727 m)  Wt 226 lb (102.513 kg)  BMI 34.37 kg/m2  SpO2 98%  LMP 05/19/2015 (Approximate)  BP Readings from Last 3 Encounters:  06/26/15 142/84  12/26/14 138/72  09/11/14 138/80    Wt Readings from Last  3 Encounters:  06/26/15 226 lb (102.513 kg)  12/26/14 235 lb 6.4 oz (106.777 kg)  09/11/14 229 lb 4 oz (103.987 kg)    General appearance: alert, cooperative and appears stated age Ears: normal TM's and external ear canals both ears Throat: lips, mucosa, and tongue normal; teeth and gums normal Neck: no adenopathy, no carotid bruit, supple, symmetrical, trachea midline and thyroid not enlarged, symmetric, no tenderness/mass/nodules Back: symmetric, no curvature.  ROM normal. No CVA tenderness. Lungs: clear to auscultation bilaterally Heart: regular rate and rhythm, S1, S2 normal, no murmur, click, rub or gallop Abdomen: soft, non-tender; bowel sounds normal; no masses,  no organomegaly Pulses: 2+ and symmetric Skin: Skin color, texture, turgor normal. No rashes or lesions Lymph nodes: Cervical, supraclavicular, and axillary nodes normal.  Lab Results  Component Value Date   HGBA1C 7.1* 06/26/2015   HGBA1C 7.2* 09/11/2014   HGBA1C 7.0* 12/21/2013    Lab Results  Component Value Date   CREATININE 0.60 06/26/2015   CREATININE 0.7 12/21/2013   CREATININE 0.7 10/14/2013    Lab Results  Component Value Date   WBC 7.4 12/21/2013   HGB 11.7* 12/21/2013   HCT 35.5* 12/21/2013   PLT 351.0 12/21/2013   GLUCOSE 137* 06/26/2015   CHOL 206* 06/26/2015   TRIG 156.0* 06/26/2015   HDL 63.80 06/26/2015   LDLDIRECT 130.7 04/13/2013   LDLCALC 111* 06/26/2015   ALT 19 06/26/2015   AST 27 06/26/2015   NA 135 06/26/2015   K 4.6 06/26/2015   CL 100 06/26/2015   CREATININE 0.60 06/26/2015   BUN 14 06/26/2015   CO2 25 06/26/2015   TSH 3.35 10/14/2013   HGBA1C 7.1* 06/26/2015    Mm Digital Screening Bilateral  01/26/2015  CLINICAL DATA:  Screening. EXAM: DIGITAL SCREENING BILATERAL MAMMOGRAM WITH CAD COMPARISON:  Previous exam(s). ACR Breast Density Category b: There are scattered areas of fibroglandular density. FINDINGS: There are no findings suspicious for malignancy. Images were processed with CAD. IMPRESSION: No mammographic evidence of malignancy. A result letter of this screening mammogram will be mailed directly to the patient. RECOMMENDATION: Screening mammogram in one year. (Code:SM-B-01Y) BI-RADS CATEGORY  1: Negative. Electronically Signed   By: Everlean Alstrom M.D.   On: 01/26/2015 08:17    Assessment & Plan:   Problem List Items Addressed This Visit    Hyperlipidemia    Not at goal on current statin therapy . she has no side effects  and  liver enzymes are stable.  Will change statin to higher potency atorvastatin .  Lab Results  Component Value Date   CHOL 206* 06/26/2015   HDL 63.80 06/26/2015   LDLCALC 111* 06/26/2015   LDLDIRECT 130.7 04/13/2013   TRIG 156.0* 06/26/2015   CHOLHDL 3 06/26/2015   Lab Results  Component Value Date   ALT 19 06/26/2015   AST 27 06/26/2015   ALKPHOS 134* 06/26/2015   BILITOT 0.5 06/26/2015                Relevant Medications   atorvastatin (LIPITOR) 20 MG tablet   Obesity    I have congratulated her in reduction of   BMI and encouraged  Continued weight loss with goal of 10% of body weigh over the next 6 months using a low glycemic index diet and regular exercise a minimum of 5 days per week.        Diabetes mellitus without complication     well-controlled on current medications.  hemoglobin A1c has been consistently at or  less than 7.0 . Patient is up-to-date on eye exams and foot exam is normal today. Patient is due for urine microalbumin to creatinine ratio . Patient is tolerating statin therapy for CAD risk reduction .  Lab Results  Component Value Date   HGBA1C 7.1* 06/26/2015   No results found for: MICROALBUR, [id number]         Relevant Medications   atorvastatin (LIPITOR) 20 MG tablet   Anemia, iron deficiency    cologuuard ordered      UTI (urinary tract infection)    Symptomatic.  Empiric cipro prescribed      Generalized anxiety disorder    Triggered by death of mother and increasing concern over her father's economic and emotional wellbeing . Given her concern over weight gain with SSRI,  Trial of buspirone discussed.       Elevated liver enzymes   Relevant Orders   Comp Met (CMET) (Completed)    Other Visit Diagnoses    Dysuria    -  Primary    Relevant Orders    POCT Urinalysis Dipstick (Completed)    Urine Culture    Urinalysis, Routine w reflex microscopic       I have discontinued Ms. Dross's pravastatin and predniSONE. I  am also having her start on ciprofloxacin, busPIRone, and atorvastatin. Additionally, I am having her maintain her cetirizine, meclizine, albuterol, etodolac, butalbital-acetaminophen-caffeine, metFORMIN, scopolamine, and MICROGESTIN.  Meds ordered this encounter  Medications  . ciprofloxacin (CIPRO) 250 MG tablet    Sig: Take 1 tablet (250 mg total) by mouth 2 (two) times daily.    Dispense:  5 tablet    Refill:  0  . busPIRone (BUSPAR) 7.5 MG tablet    Sig: Take 1 tablet (7.5 mg total) by mouth 3 (three) times daily.    Dispense:  90 tablet    Refill:  1  . atorvastatin (LIPITOR) 20 MG tablet    Sig: Take 1 tablet (20 mg total) by mouth daily.    Dispense:  90 tablet    Refill:  3    Medications Discontinued During This Encounter  Medication Reason  . predniSONE (DELTASONE) 10 MG tablet Completed Course  . pravastatin (PRAVACHOL) 40 MG tablet     Follow-up: No Follow-up on file.   Crecencio Mc, MD

## 2015-06-26 NOTE — Progress Notes (Signed)
Pre-visit discussion using our clinic review tool. No additional management support is needed unless otherwise documented below in the visit note.  

## 2015-06-26 NOTE — Assessment & Plan Note (Signed)
well-controlled on current medications.  hemoglobin A1c has been consistently at or  less than 7.0 . Patient is up-to-date on eye exams and foot exam is normal today. Patient is due for urine microalbumin to creatinine ratio . Patient is tolerating statin therapy for CAD risk reduction .  Lab Results  Component Value Date   HGBA1C 7.1* 06/26/2015   No results found for: Concepcion ElkMICROALBUR, [id number]

## 2015-06-26 NOTE — Assessment & Plan Note (Signed)
cologuuard ordered

## 2015-06-27 ENCOUNTER — Telehealth: Payer: Self-pay | Admitting: *Deleted

## 2015-06-27 ENCOUNTER — Other Ambulatory Visit: Payer: Self-pay

## 2015-06-27 MED ORDER — CIPROFLOXACIN HCL 250 MG PO TABS: 250.0000 mg | ORAL_TABLET | Freq: Two times a day (BID) | ORAL | Status: DC

## 2015-06-27 NOTE — Telephone Encounter (Signed)
Lab called saying urine leaked out and they need another sample, called pt and she said she would come in Monday morning to do another sample

## 2015-06-27 NOTE — Telephone Encounter (Signed)
Patient notified and voiced understanding.

## 2015-06-27 NOTE — Telephone Encounter (Signed)
No need to recollect urine unless symptoms do not improve over the weekend on the antibiotic I gave her

## 2015-06-27 NOTE — Telephone Encounter (Signed)
FYI

## 2015-06-28 ENCOUNTER — Encounter: Payer: Self-pay | Admitting: Internal Medicine

## 2015-06-29 ENCOUNTER — Encounter: Payer: Self-pay | Admitting: Internal Medicine

## 2015-08-19 ENCOUNTER — Other Ambulatory Visit: Payer: Self-pay | Admitting: Internal Medicine

## 2015-09-20 LAB — COLOGUARD: COLOGUARD: NEGATIVE

## 2015-09-25 ENCOUNTER — Encounter: Payer: Self-pay | Admitting: Internal Medicine

## 2015-09-27 ENCOUNTER — Encounter: Payer: Self-pay | Admitting: Internal Medicine

## 2015-09-28 ENCOUNTER — Telehealth: Payer: Self-pay | Admitting: Internal Medicine

## 2015-09-28 NOTE — Telephone Encounter (Signed)
My Chart message sent

## 2015-10-13 ENCOUNTER — Encounter: Payer: Self-pay | Admitting: Internal Medicine

## 2015-10-16 ENCOUNTER — Encounter: Payer: Self-pay | Admitting: Internal Medicine

## 2015-10-16 NOTE — Telephone Encounter (Signed)
Ok to call in Etodolac fo refill?

## 2015-10-17 ENCOUNTER — Other Ambulatory Visit: Payer: Self-pay | Admitting: Internal Medicine

## 2015-10-17 MED ORDER — ETODOLAC 500 MG PO TABS: 500.0000 mg | ORAL_TABLET | Freq: Two times a day (BID) | ORAL | Status: DC

## 2015-10-29 ENCOUNTER — Encounter: Payer: Self-pay | Admitting: Internal Medicine

## 2015-11-05 ENCOUNTER — Encounter: Payer: Self-pay | Admitting: Internal Medicine

## 2015-11-06 ENCOUNTER — Ambulatory Visit (INDEPENDENT_AMBULATORY_CARE_PROVIDER_SITE_OTHER): Payer: BLUE CROSS/BLUE SHIELD | Admitting: Family Medicine

## 2015-11-06 ENCOUNTER — Encounter: Payer: Self-pay | Admitting: Family Medicine

## 2015-11-06 ENCOUNTER — Telehealth: Payer: Self-pay | Admitting: Internal Medicine

## 2015-11-06 VITALS — BP 116/66 | HR 102 | Temp 99.5°F | Ht 68.0 in | Wt 220.0 lb

## 2015-11-06 DIAGNOSIS — J209 Acute bronchitis, unspecified: Secondary | ICD-10-CM

## 2015-11-06 MED ORDER — AZITHROMYCIN 250 MG PO TABS: ORAL_TABLET | ORAL | Status: DC

## 2015-11-06 MED ORDER — PREDNISONE 20 MG PO TABS: 40.0000 mg | ORAL_TABLET | Freq: Every day | ORAL | Status: DC

## 2015-11-06 MED ORDER — ALBUTEROL SULFATE HFA 108 (90 BASE) MCG/ACT IN AERS: 2.0000 | INHALATION_SPRAY | Freq: Four times a day (QID) | RESPIRATORY_TRACT | Status: DC | PRN

## 2015-11-06 NOTE — Telephone Encounter (Signed)
See team health note. 

## 2015-11-06 NOTE — Telephone Encounter (Signed)
Spoke with the patient she is 2 hours out from the episode. She is feeling much better, she ate some food and has increased her fluid intake.  She stated she was advised to go to the ED, but declines at this time.  If she feels bad she will go or if it happens again.  She thinks it was from not eating enough this morning and having to wear the mask at the office.

## 2015-11-06 NOTE — Assessment & Plan Note (Addendum)
Symptoms consistent with acute bronchitis. Also possibly has some sinusitis contributing. Oxygenation is in the normal range. She has no increased work of breathing. She does have expiratory wheezes. She used her albuterol inhaler within the last hour and thus we held off on albuterol nebulizer treatment. Suspect this could be contributing to her minimally elevated heart rate, though could be related to the infection. I discussed obtaining a chest x-ray given her fever though she wanted to hold off on this to see if she improved with treatment. We will treat her with prednisone, azithromycin, and scheduled albuterol for the next 2 days. She will continue to monitor. Given return precautions.

## 2015-11-06 NOTE — Telephone Encounter (Signed)
Pt called back to report that she almost fainted in [employer] picking up her Rx. Pt was told that if anything was different to call. Pt did states she is ok now. I did transfer pt to  Nurse line. Call pt @ [phone]. Thank you!

## 2015-11-06 NOTE — Telephone Encounter (Signed)
PLEASE NOTE: All timestamps contained within this report are represented as [place] Standard Time. CONFIDENTIALTY NOTICE: This fax transmission is intended only for the addressee. It contains information that is legally privileged, confidential or otherwise protected from use or disclosure. If you are not the intended recipient, you are strictly prohibited from reviewing, disclosing, copying using or disseminating any of this information or taking any action in reliance on or regarding this information. If you have received this fax in error, please notify us immediately by telephone so that we can arrange for its return to us. Phone: [phone], Toll-Free: [phone], Fax: [phone] Page: 1 of 1 Call Id: [id number] [facility] [place] TELEPHONE ADVICE RECORD [facility] Patient Name: Ian BushmanCHERYL Virtue DOB: 04/06/1961 Initial Comment caller states she fainted at [employer] Nurse Assessment Nurse: Elijah Birkaldwell, RN, Stark BrayLynda Date/Time (Eastern Time): 11/06/2015 11:06:55 AM Confirm and document reason for call. If symptomatic, describe symptoms. You must click the next button to save text entered. ---[facility] slumped over while picking up her rxs for bronchitis. Was out for a couple seconds. Felt warm before & dizzy beforehand. Has the patient traveled out of the country within the last 30 days? ---Not Applicable Does the patient have any new or worsening symptoms? ---Yes Will a triage be completed? ---Yes Related visit to physician within the last 2 weeks? ---Yes Does the PT have any chronic conditions? (i.e. diabetes, asthma, etc.) ---Yes List chronic conditions. ---asthma, diabetes, high cholesterol Is this a behavioral health or substance abuse call? ---No Guidelines Guideline Title Affirmed Question Affirmed Notes Fainting [1] Age > 50 years AND [2] now alert and feels fine Final Disposition User Go to  ED Now (or PCP triage) Elijah Birkaldwell, RN, Lynda Comments Caller states she had been wearing a mask at the office so she wouldn't get anyone else sick, thinks that made her feel warmer than usual. She also only ate an orange for breakfast, rather than the egg she usually eats. Hasn't been drinking enough fluids probably. She feels fine now, her color is back & she is drinking fluids & plans to eat something. Has not checked her blood sugar. If she starts to feel that way again, she will be seen at the ED. This happened an hour or more ago. Referrals GO TO FACILITY UNDECIDED Disagree/Comply: Comply

## 2015-11-06 NOTE — Progress Notes (Signed)
Pre visit review using our clinic review tool, if applicable. No additional management support is needed unless otherwise documented below in the visit note. 

## 2015-11-06 NOTE — Telephone Encounter (Signed)
Noted. Thanks for speaking with the patient.

## 2015-11-06 NOTE — Patient Instructions (Signed)
Nice to meet you. Your symptoms are likely related to bronchitis and possibly a sinus infection. We will treat you with azithromycin and prednisone. You can also use her albuterol inhaler every 4 hours for the next 2 days and then every 6 hours as needed. If you develop chest pain, shortness breath, cough productive of blood, persistent fevers, or any new or changing symptoms please seek medical attention.

## 2015-11-06 NOTE — Telephone Encounter (Signed)
Patient should be evaluated in the ED given her syncopal event.

## 2015-11-06 NOTE — Progress Notes (Signed)
Patient ID: Rachel Riggs, female   DOB: 10/30/1960, 55 y.o.   MRN: [id number]  Rachel AlarEric Aarushi Hemric, MD Phone: [phone]  Rachel BunkerCheryl A Riggs is a 55 y.o. female who presents today for same-day visit.  Patient notes 4-5 days of minimally productive cough and wheezing. She notes a stinging discomfort in her chest and back when she coughs though no chest pain or back pain otherwise. Minimal shortness of breath with cough though no other shortness of breath. No exertional complaints. No hemoptysis. She does note a temperature to 101.20F over the last several days. She notes maxillary sinus congestion as well with teeth pain. She does note she's been using her inhaler and this is helped some decrease her cough. Feels this is consistent with her prior episodes of bronchitis.  PMH: Former smoker   ROS see history of present illness  Objective  Physical Exam Filed Vitals:   11/06/15 0937  BP: 116/66  Pulse: 102  Temp: 99.5 F (37.5 C)    BP Readings from Last 3 Encounters:  11/06/15 116/66  06/26/15 142/84  12/26/14 138/72   Wt Readings from Last 3 Encounters:  11/06/15 220 lb (99.791 kg)  06/26/15 226 lb (102.513 kg)  12/26/14 235 lb 6.4 oz (106.777 kg)    Physical Exam  Constitutional: No distress.  HENT:  Head: Normocephalic and atraumatic.  Right Ear: External ear normal.  Left Ear: External ear normal.  Mouth/Throat: Oropharynx is clear and moist. No oropharyngeal exudate.  Eyes: Conjunctivae are normal. Pupils are equal, round, and reactive to light.  Neck: Neck supple.  Cardiovascular: Normal rate, regular rhythm and normal heart sounds.   Pulmonary/Chest: Effort normal. No respiratory distress. She has wheezes (mild expiratory scattered wheezes). She has no rales.  Lymphadenopathy:    She has no cervical adenopathy.  Neurological: She is alert. Gait normal.  Skin: Skin is warm and dry. She is not diaphoretic.     Assessment/Plan: Please see individual problem  list.  Acute bronchitis Symptoms consistent with acute bronchitis. Also possibly has some sinusitis contributing. Oxygenation is in the normal range. She has no increased work of breathing. She does have expiratory wheezes. She used her albuterol inhaler within the last hour and thus we held off on albuterol nebulizer treatment. Suspect this could be contributing to her minimally elevated heart rate, though could be related to the infection. I discussed obtaining a chest x-ray given her fever though she wanted to hold off on this to see if she improved with treatment. We will treat her with prednisone, azithromycin, and scheduled albuterol for the next 2 days. She will continue to monitor. Given return precautions.    No orders of the defined types were placed in this encounter.    Meds ordered this encounter  Medications  . predniSONE (DELTASONE) 20 MG tablet    Sig: Take 2 tablets (40 mg total) by mouth daily with breakfast.    Dispense:  10 tablet    Refill:  0  . azithromycin (ZITHROMAX) 250 MG tablet    Sig: Please take 500 mg (2 tablets) by mouth today, then take 250 mg (1 tablet) by mouth for the next 4 days    Dispense:  6 tablet    Refill:  0  . albuterol (VENTOLIN HFA) 108 (90 Base) MCG/ACT inhaler    Sig: Inhale 2 puffs into the lungs every 6 (six) hours as needed for wheezing.    Dispense:  1 Inhaler    Refill:  2  Rachel Rumps, MD [place]

## 2015-11-13 ENCOUNTER — Telehealth: Payer: Self-pay | Admitting: Internal Medicine

## 2015-11-13 NOTE — Telephone Encounter (Signed)
Yes 11;30 tommorrow is fine, but has any of the 203 RNs involved  thought to ask about her blood pressure and pulse?

## 2015-11-13 NOTE — Telephone Encounter (Signed)
Spoke with the patient.  She had another episode at work this time.  The Nurse at her work came and checked her out and her BS was 203.  Per the patient she is taking all her medications as prescribed.  She is nervous that this has happened twice now.  She knows that she is due for a physical.  All of our providers are full today, could we see her tomorrow maybe in the 1130 slot?  Please advise.    She saw Dr. Birdie SonsSonnenberg about one week ago and was given sinus meds then [facility]  She declined to go to the ER that day.  See Triage notes from last Tuesday. Thanks

## 2015-11-13 NOTE — Telephone Encounter (Signed)
Patient Name: Rachel BushmanCHERYL Riggs DOB: 09/04/1960 Initial Comment caller is Rachel Riggs - is calling from [employer] (she is a co-worker of the patient and is there with her) - has a pt of Dr Ceasar Lundullos - is having dizziness, is light headed and has vomited, her bld sugar is 203 - states she had her medication this am Nurse Assessment Nurse: Elijah Birkaldwell, RN, Stark BrayLynda Date/Time (Eastern Time): 11/13/2015 10:04:04 AM Confirm and document reason for call. If symptomatic, describe symptoms. You must click the next button to save text entered. ---Caller is Rachel CruiseBrenda Riggs, is calling from [employer] (she is a co-worker of the patient and is there with her). She has a pt. of Dr Ceasar Lundullos. She started feeling hot at a meeting. She is having dizziness, is light headed and has vomited. Her blood sugar is 203. States she had her medication this am. Has the patient traveled out of the country within the last 30 days? ---Not Applicable Does the patient have any new or worsening symptoms? ---Yes Will a triage be completed? ---Yes Related visit to physician within the last 2 weeks? ---No Does the PT have any chronic conditions? (i.e. diabetes, asthma, etc.) ---Yes List chronic conditions. ---on Metformin, on cholesterol rx Is this a behavioral health or substance abuse call? ---No Guidelines Guideline Title Affirmed Question Affirmed Notes Sinus Infection on Antibiotic Follow-up Call [1] Reasonable improvement on antibiotic AND [2] no fever or pain (all triage questions negative) Final Disposition User Home Care Lihuealdwell, RN, Stark BrayLynda Comments Triaged for sinus infection as patient had just finished a z pack for ongoing sinus & bronchitis issues. She had eaten this am & taken her rx and suddenly felt warm, very dizzy & nausea. She would like to be seen by her Dr., had seen a PA last week. No availability this week with Dr. Darrick Huntsmanullo. She would like a follow-up as this is starting to scare  her. Referrals REFERRED TO PCP OFFICE Disagree/Comply: Comply

## 2015-11-13 NOTE — Telephone Encounter (Signed)
Spoke with patient, she will be here at [id number] tomorrow.  Her blood pressure was her normal 136/84, no heart rate checked or noted to her.  She wanted me to let you know that she is still not feeling great since the original appt with Dr. Birdie SonsSonnenberg for her bronchitiis so she believes that it is making her blood sugar go all over the place.  I asked her what it was prior to going to work and she didn't check it.  Per the patient she checks it only when she feels bad. Thanks

## 2015-11-14 ENCOUNTER — Telehealth: Payer: Self-pay | Admitting: Internal Medicine

## 2015-11-14 ENCOUNTER — Ambulatory Visit
Admission: RE | Admit: 2015-11-14 | Discharge: 2015-11-14 | Disposition: A | Discharge status: Home / Self Care | Payer: BLUE CROSS/BLUE SHIELD | Source: Ambulatory Visit | Attending: Internal Medicine | Admitting: Internal Medicine

## 2015-11-14 ENCOUNTER — Encounter: Payer: Self-pay | Admitting: Internal Medicine

## 2015-11-14 ENCOUNTER — Telehealth: Payer: Self-pay

## 2015-11-14 ENCOUNTER — Ambulatory Visit (INDEPENDENT_AMBULATORY_CARE_PROVIDER_SITE_OTHER): Payer: BLUE CROSS/BLUE SHIELD | Admitting: Internal Medicine

## 2015-11-14 VITALS — BP 122/74 | HR 93 | Temp 98.5°F | Resp 14 | Ht 68.0 in | Wt 216.5 lb

## 2015-11-14 DIAGNOSIS — R55 Syncope and collapse: Secondary | ICD-10-CM | POA: Diagnosis not present

## 2015-11-14 DIAGNOSIS — E119 Type 2 diabetes mellitus without complications: Secondary | ICD-10-CM

## 2015-11-14 DIAGNOSIS — Z9189 Other specified personal risk factors, not elsewhere classified: Secondary | ICD-10-CM

## 2015-11-14 DIAGNOSIS — J209 Acute bronchitis, unspecified: Secondary | ICD-10-CM | POA: Diagnosis not present

## 2015-11-14 HISTORY — DX: Type 2 diabetes mellitus without complications: E11.9

## 2015-11-14 LAB — D-DIMER, QUANTITATIVE: D-Dimer, Quant: 1.43 ug/mL-FEU — ABNORMAL HIGH (ref 0.00–0.48)

## 2015-11-14 LAB — BASIC METABOLIC PANEL
BUN: 11 mg/dL (ref 6–23)
CO2: 25 mEq/L (ref 19–32)
CREATININE: 0.63 mg/dL (ref 0.40–1.20)
Calcium: 9.7 mg/dL (ref 8.4–10.5)
Chloride: 100 mEq/L (ref 96–112)
GFR: 104.21 mL/min (ref >60.00)
Glucose, Bld: 153 mg/dL — ABNORMAL HIGH (ref 70–99)
POTASSIUM: 4.3 meq/L (ref 3.5–5.1)
Sodium: 136 mEq/L (ref 135–145)

## 2015-11-14 LAB — TROPONIN I: TNIDX: 0.01 ug/L (ref 0.00–0.06)

## 2015-11-14 LAB — CK TOTAL AND CKMB (NOT AT ARMC)
CK, MB: <0.7 ng/mL (ref 0.0–5.0)
Total CK: 22 U/L (ref 7–177)

## 2015-11-14 LAB — POCT I-STAT CREATININE: Creatinine, Ser: 0.5 mg/dL (ref 0.44–1.00)

## 2015-11-14 LAB — GLUCOSE, POCT (MANUAL RESULT ENTRY): POC GLUCOSE: 176 mg/dL — AB (ref 70–99)

## 2015-11-14 MED ORDER — IOPAMIDOL (ISOVUE-370) INJECTION 76%
75.0000 mL | Freq: Once | INTRAVENOUS | Status: AC | PRN
  Administered 2015-11-14: 75 mL via INTRAVENOUS

## 2015-11-14 MED ORDER — BENZONATATE 200 MG PO CAPS: 200.0000 mg | ORAL_CAPSULE | Freq: Three times a day (TID) | ORAL | Status: DC | PRN

## 2015-11-14 NOTE — Addendum Note (Signed)
Addended by: Sherlene ShamsULLO, Antion Andres L on: 11/14/2015 04:17 PM   Modules accepted: Orders

## 2015-11-14 NOTE — Telephone Encounter (Signed)
See unrouted message  

## 2015-11-14 NOTE — Patient Instructions (Signed)
I am concerned that your recurrent fainting may be due to a blood clot in your lungs since you have been travelling so much.  I am ordering a CT angiogram of your lungs.  This will also give us more information about your coronary arteries in terms of placque buildup.   The bloodwork today includes cardiac enzymes which will  Be elevated if the heart muscle has been damaged from a heart attack

## 2015-11-14 NOTE — Telephone Encounter (Signed)
Patient notified and voiced understanding.

## 2015-11-14 NOTE — Telephone Encounter (Signed)
STAT CT Angio Chest Results called to [facility].   Available in EPIC for viewing.  IMPRESSION: 1. No evidence of acute pulmonary embolus. 2. Mild bilateral lower lobe mosaic lung attenuation such as due to chronic small airway or small vessel disease. 3. Otherwise negative chest. These results will be called to the ordering clinician or representative by the Radiology Department at the imaging location.   Please advise. Thanks

## 2015-11-14 NOTE — Progress Notes (Signed)
Subjective:  Patient ID: Rachel Riggs, female    DOB: 21-Jun-1961  Age: 55 y.o. MRN: [id number]  CC: The primary encounter diagnosis was Syncope and collapse. Diagnoses of Diabetes mellitus without complication, Syncope, unspecified syncope type, At risk for venous thromboembolism (VTE), and Acute bronchitis, unspecified organism were also pertinent to this visit.  HPI Rachel Riggs presents for evaluation after having two episodes of syncope and presyncope.   The most recent episode occurred yesterday at work and associated with sudden onset of dizziness,  Nausea and diaphoresis accompanied by confusion. She did not faint.  BP was normal and BS was 203  First episode occurred  On April. after being evaluated and treated by Dr Birdie Sons for acute bronchitis.  This occurred while waiting at [facility] for her medications to be filled. LOC 30 secs.  She was in a fasting state and had only only eaten an orange 4 hours earlier and had taken the metformin .  Went home and slept .  Stayed home for 3 days .    Marland Kitchen   She denies any previous history of syncope.  Her younger bother Rachel Riggs died  Recently at ome at the age of 40  Of a massive MI while at home . He had no history of CAD, was a lifelong nonsmoker, and had  controlled DM.   The patient has been  been making long car trips of up to 10 hours due to brother's death.   And her last trip was 3 days prior to onset of symptoms .Marland Kitchen  Denies leg swelling,  Getting enough sleep     Outpatient Prescriptions Prior to Visit  Medication Sig Dispense Refill  . albuterol (VENTOLIN HFA) 108 (90 Base) MCG/ACT inhaler Inhale 2 puffs into the lungs every 6 (six) hours as needed for wheezing. 1 Inhaler 2  . atorvastatin (LIPITOR) 20 MG tablet Take 1 tablet (20 mg total) by mouth daily. 90 tablet 3  . busPIRone (BUSPAR) 7.5 MG tablet Take 1 tablet (7.5 mg total) by mouth 3 (three) times daily. 90 tablet 1  . butalbital-acetaminophen-caffeine (FIORICET, ESGIC)  50-325-40 MG per tablet TAKE ONE TO TWO TABLETS BY MOUTH EVERY 6 HOURS AS NEEDED FOR HEADACHE 90 tablet 2  . cetirizine (ZYRTEC) 10 MG tablet Take 10 mg by mouth daily as needed for allergies.    Marland Kitchen etodolac (LODINE) 500 MG tablet Take 1 tablet (500 mg total) by mouth 2 (two) times daily. 60 tablet 3  . meclizine (ANTIVERT) 25 MG tablet Take 1 tablet (25 mg total) by mouth 3 (three) times daily as needed. 30 tablet 0  . metFORMIN (GLUCOPHAGE) 500 MG tablet TAKE ONE TABLET BY MOUTH TWICE DAILY WITH A MEAL 180 tablet 0  . MICROGESTIN 1-20 MG-MCG tablet TAKE ONE TABLET BY MOUTH ONCE DAILY 21 tablet 11  . scopolamine (TRANSDERM-SCOP, 1.5 MG,) 1 MG/3DAYS Place 1 patch (1.5 mg total) onto the skin every 3 (three) days. (Patient not taking: Reported on 11/14/2015) 10 patch 0  . azithromycin (ZITHROMAX) 250 MG tablet Please take 500 mg (2 tablets) by mouth today, then take 250 mg (1 tablet) by mouth for the next 4 days 6 tablet 0  . predniSONE (DELTASONE) 20 MG tablet Take 2 tablets (40 mg total) by mouth daily with breakfast. 10 tablet 0   No facility-administered medications prior to visit.    Review of Systems;  Patient denies headache, fevers, malaise, unintentional weight loss, skin rash, eye pain, sinus congestion and sinus  pain, sore throat, dysphagia,  hemoptysis ,  chest pain, palpitations, orthopnea, edema, abdominal pain, nausea, melena, diarrhea, constipation, flank pain, dysuria, hematuria, urinary  Frequency, nocturia, numbness, tingling, seizures,  Focal weakness, Tremor, insomnia, depression, anxiety, and suicidal ideation.      Objective:  BP 122/74 mmHg  Pulse 93  Temp(Src) 98.5 F (36.9 C) (Oral)  Resp 14  Ht 5\' 8"  (1.727 m)  Wt 216 lb 8 oz (98.204 kg)  BMI 32.93 kg/m2  SpO2 98%  LMP 10/26/2015 (Approximate)  BP Readings from Last 3 Encounters:  11/14/15 122/74  11/06/15 116/66  06/26/15 142/84    Wt Readings from Last 3 Encounters:  11/14/15 216 lb 8 oz (98.204 kg)    11/06/15 220 lb (99.791 kg)  06/26/15 226 lb (102.513 kg)    General appearance: alert, cooperative and appears stated age Ears: normal TM's and external ear canals both ears Throat: lips, mucosa, and tongue normal; teeth and gums normal Neck: no adenopathy, no carotid bruit, supple, symmetrical, trachea midline and thyroid not enlarged, symmetric, no tenderness/mass/nodules Back: symmetric, no curvature. ROM normal. No CVA tenderness. Lungs: clear to auscultation bilaterally Heart: regular rate and rhythm, S1, S2 normal, no murmur, click, rub or gallop Abdomen: soft, non-tender; bowel sounds normal; no masses,  no organomegaly Pulses: 2+ and symmetric Skin: Skin color, texture, turgor normal. No rashes or lesions Lymph nodes: Cervical, supraclavicular, and axillary nodes normal.  Lab Results  Component Value Date   HGBA1C 7.1* 06/26/2015   HGBA1C 7.2* 09/11/2014   HGBA1C 7.0* 12/21/2013    Lab Results  Component Value Date   CREATININE 0.50 11/14/2015   CREATININE 0.63 11/14/2015   CREATININE 0.60 06/26/2015    Lab Results  Component Value Date   WBC 7.4 12/21/2013   HGB 11.7* 12/21/2013   HCT 35.5* 12/21/2013   PLT 351.0 12/21/2013   GLUCOSE 153* 11/14/2015   CHOL 206* 06/26/2015   TRIG 156.0* 06/26/2015   HDL 63.80 06/26/2015   LDLDIRECT 130.7 04/13/2013   LDLCALC 111* 06/26/2015   ALT 19 06/26/2015   AST 27 06/26/2015   NA 136 11/14/2015   K 4.3 11/14/2015   CL 100 11/14/2015   CREATININE 0.50 11/14/2015   BUN 11 11/14/2015   CO2 25 11/14/2015   TSH 3.35 10/14/2013   HGBA1C 7.1* 06/26/2015     Assessment & Plan:   Problem List Items Addressed This Visit    Acute bronchitis    I am repeating her prednisone with a tapering dose given her persistent cough and history of asthma.       Syncope and collapse - Primary    She is not orthostatic.  Given her history of prolonged travel /immobilization a nd obesity. Need to rule out PE.  Given her FH of early  CAD,  Needs cardiac evaluation ./ enzymes normal x 1 set today  CT negative for acute PE       Relevant Orders   D-Dimer, Quantitative (Completed)   Basic metabolic panel (Completed)   Troponin I (Completed)   CK total and CKMB [facility]cardiac)[facility] (Completed)   CT Angio Chest W/Cm &/Or Wo Cm (Completed)   Diabetes mellitus without complication   Relevant Orders   POCT Glucose (CBG) (Completed)    Other Visit Diagnoses    Syncope, unspecified syncope type        Relevant Orders    EKG 12-Lead (Completed)    At risk for venous thromboembolism (VTE)  Relevant Orders    CT Angio Chest W/Cm &/Or Wo Cm (Completed)      A total of 40 minutes was spent with patient more than half of which was spent in counseling patient on the above mentioned issues , reviewing and explaining recent labs and imaging studies done, and coordination of care. I have discontinued Ms. Mumford's predniSONE and azithromycin. I am also having her start on benzonatate and predniSONE. Additionally, I am having her maintain her cetirizine, meclizine, butalbital-acetaminophen-caffeine, scopolamine, MICROGESTIN, busPIRone, atorvastatin, metFORMIN, etodolac, albuterol, and B-complex with vitamin C.  Meds ordered this encounter  Medications  . B Complex-C (B-COMPLEX WITH VITAMIN C) tablet    Sig: Take 1 tablet by mouth daily.  . benzonatate (TESSALON) 200 MG capsule    Sig: Take 1 capsule (200 mg total) by mouth 3 (three) times daily as needed for cough.    Dispense:  30 capsule    Refill:  1  . predniSONE (DELTASONE) 10 MG tablet    Sig: 6 tablets on Day 1 , then reduce by 1 tablet daily until gone    Dispense:  21 tablet    Refill:  0    Medications Discontinued During This Encounter  Medication Reason  . azithromycin (ZITHROMAX) 250 MG tablet Completed Course  . predniSONE (DELTASONE) 20 MG tablet Completed Course    Follow-up: No Follow-up on file.   Sherlene Shams, MD

## 2015-11-14 NOTE — Telephone Encounter (Signed)
Got a phone call regards critical lab (on call): CK negative, + d-dimer. Chart reviewed, patient was seen earlier today by PCP, chart reviewed, CT chest was negative for PE. No further action needed.

## 2015-11-14 NOTE — Telephone Encounter (Signed)
THE CT WAS NEGATIVE FOR BLOOD CLOT.   I m going to set her up for a cardiology appointment .  In the meantime, increase hydration make sure she does not skip meals.

## 2015-11-14 NOTE — Progress Notes (Signed)
Pre-visit discussion using our clinic review tool. No additional management support is needed unless otherwise documented below in the visit note.  

## 2015-11-15 ENCOUNTER — Encounter: Payer: Self-pay | Admitting: Internal Medicine

## 2015-11-15 DIAGNOSIS — R55 Syncope and collapse: Secondary | ICD-10-CM | POA: Insufficient documentation

## 2015-11-15 MED ORDER — PREDNISONE 10 MG PO TABS: ORAL_TABLET | ORAL | Status: DC

## 2015-11-15 NOTE — Assessment & Plan Note (Signed)
She is not orthostatic.  Given her history of prolonged travel /immobilization a nd obesity. Need to rule out PE.  Given her FH of early CAD,  Needs cardiac evaluation ./ enzymes normal x 1 set today  CT negative for acute PE

## 2015-11-15 NOTE — Assessment & Plan Note (Signed)
I am repeating her prednisone with a tapering dose given her persistent cough and history of asthma.

## 2015-11-18 ENCOUNTER — Other Ambulatory Visit: Payer: Self-pay | Admitting: Internal Medicine

## 2015-11-19 ENCOUNTER — Encounter: Payer: Self-pay | Admitting: Internal Medicine

## 2015-12-04 ENCOUNTER — Encounter: Payer: Self-pay | Admitting: Internal Medicine

## 2015-12-07 ENCOUNTER — Ambulatory Visit: Payer: Self-pay | Admitting: Internal Medicine

## 2015-12-11 ENCOUNTER — Ambulatory Visit (INDEPENDENT_AMBULATORY_CARE_PROVIDER_SITE_OTHER): Payer: BLUE CROSS/BLUE SHIELD | Admitting: Cardiology

## 2015-12-11 ENCOUNTER — Encounter: Payer: Self-pay | Admitting: Cardiology

## 2015-12-11 VITALS — BP 126/80 | HR 87 | Ht 68.0 in | Wt 218.1 lb

## 2015-12-11 DIAGNOSIS — E785 Hyperlipidemia, unspecified: Secondary | ICD-10-CM

## 2015-12-11 DIAGNOSIS — R404 Transient alteration of awareness: Secondary | ICD-10-CM

## 2015-12-11 DIAGNOSIS — R4020 Unspecified coma: Secondary | ICD-10-CM

## 2015-12-11 NOTE — Progress Notes (Signed)
Cardiology Office Note   Date:  12/11/2015   ID:  [name], DOB 08-12-1960, MRN [id number]  Referring Doctor:  Sherlene Shams, MD   Cardiologist:   Almond Lint, MD   Reason for consultation:  Chief Complaint  Patient presents with  . Loss of Consciousness    NO CP, SOB OR SWELLING. NO THER COMPLAINTS  . Hyperlipidemia      History of Present Illness: Rachel Riggs is a 55 y.o. female who presents for Evaluation of loss of consciousness.  Patient 11/06/2015, she was dealing with bronchitis and was feeling sick. She went to [facility] pharmacy to drop prescription for antibiotics. There was a wait, therefore she decided to get some dog bones for her dog while waiting for her prescription. She went back to the pharmacy area and set down and started feeling hot and clammy. She also had some dizziness and nausea. Another bystander saw her and asked her how she was doing and the next thing she knew she was slumped down over the chair. The pharmacist said that she probably was out for 20 seconds. She's not sure if she truly passed out because she was aware of the commotion that was going on. She does not recall any chest pain, shortness of breath or palpitations. She thinks that this may have been related to her not have anything had anything to eat at that time, mostly fasting, had an orange several hours earlier but took the metformin.  Another episode happened at work. She was in a training needing. The room is actually hot and others were also feeling that. They adjusted the temperature. But she already started feeling hot and clammy, room spinning. So she decided to go back to her office and sat down the symptoms persisted. She called the nurse. By the time the nurse got to her office, she heard her moaning. There was no loss of consciousness. Blood pressure was okay. Blood sugar was 203. No chest pain, shortness of breath or palpitations.  Patient reports that she works 7 days a  week. On the weekends, she works at [employer] for 1220 hrs. on the weekend. Although she is not doing any routine exercises, her job at the [employer] is quite physical. When she does that there is no chest pain, shortness of breath.  No headache, fever, cough, colds, abdominal pain. No PND, orthopnea, edema.   ROS:  Please see the history of present illness. Aside from mentioned under HPI, all other systems are reviewed and negative.    Past Medical History  Diagnosis Date  . Asthma   . Hyperlipidemia   . Migraines   . Diabetes mellitus without complication [facility])     Past Surgical History  Procedure Laterality Date  . Hernia repair  1991     reports that she quit smoking about 31 years ago. She has never used smokeless tobacco. She reports that she drinks about 0.6 oz of alcohol per week. She reports that she does not use illicit drugs.   family history includes COPD in her paternal grandfather; Cancer in her mother; Cancer (age of onset: 39) in her brother; Diabetes in her father, father, and maternal grandmother; Heart disease in her paternal grandfather; Heart disease (age of onset: 54) in her father; Heart failure in her mother; Hyperlipidemia in her father; Hypertension in her father; Parkinsonism (age of onset: 47) in her mother; Stroke in her maternal grandmother.   Current Outpatient Prescriptions  Medication  Sig Dispense Refill  . albuterol (VENTOLIN HFA) 108 (90 Base) MCG/ACT inhaler Inhale 2 puffs into the lungs every 6 (six) hours as needed for wheezing. 1 Inhaler 2  . atorvastatin (LIPITOR) 20 MG tablet Take 1 tablet (20 mg total) by mouth daily. 90 tablet 3  . B Complex-C (B-COMPLEX WITH VITAMIN C) tablet Take 1 tablet by mouth daily.    . butalbital-acetaminophen-caffeine (FIORICET, ESGIC) 50-325-40 MG per tablet TAKE ONE TO TWO TABLETS BY MOUTH EVERY 6 HOURS AS NEEDED FOR HEADACHE 90 tablet 2  . cetirizine (ZYRTEC) 10 MG tablet Take 10 mg by mouth daily  as needed for allergies.    Marland Kitchen etodolac (LODINE) 500 MG tablet Take 1 tablet (500 mg total) by mouth 2 (two) times daily. 60 tablet 3  . meclizine (ANTIVERT) 25 MG tablet Take 1 tablet (25 mg total) by mouth 3 (three) times daily as needed. 30 tablet 0  . metFORMIN (GLUCOPHAGE) 500 MG tablet TAKE ONE TABLET BY MOUTH TWICE DAILY WITH MEALS 180 tablet 3  . MICROGESTIN 1-20 MG-MCG tablet TAKE ONE TABLET BY MOUTH ONCE DAILY 21 tablet 11  . busPIRone (BUSPAR) 7.5 MG tablet Take 1 tablet (7.5 mg total) by mouth 3 (three) times daily. (Patient not taking: Reported on 12/11/2015) 90 tablet 1   No current facility-administered medications for this visit.    Allergies: Amoxicillin; Aspirin; Ibuprofen; Penicillins; and Sulfa antibiotics    PHYSICAL EXAM: VS:  BP 126/80 mmHg  Pulse 87  Ht 5\' 8"  (1.727 m)  Wt 218 lb 1.9 oz (98.939 kg)  BMI 33.17 kg/m2  LMP 10/26/2015 (Approximate) , Body mass index is 33.17 kg/(m^2). Wt Readings from Last 3 Encounters:  12/11/15 218 lb 1.9 oz (98.939 kg)  11/14/15 216 lb 8 oz (98.204 kg)  11/06/15 220 lb (99.791 kg)    GENERAL:  well developed, well nourished, obese, not in acute distress HEENT: normocephalic, pink conjunctivae, anicteric sclerae, no xanthelasma, normal dentition, oropharynx clear NECK:  no neck vein engorgement, JVP normal, no hepatojugular reflux, carotid upstroke brisk and symmetric, no bruit, no thyromegaly, no lymphadenopathy LUNGS:  good respiratory effort, clear to auscultation bilaterally CV:  PMI not displaced, no thrills, no lifts, S1 and S2 within normal limits, no palpable S3 or S4, no murmurs, no rubs, no gallops ABD:  Soft, nontender, nondistended, normoactive bowel sounds, no abdominal aortic bruit, no hepatomegaly, no splenomegaly MS: nontender back, no kyphosis, no scoliosis, no joint deformities EXT:  2+ DP/PT pulses, no edema, no varicosities, no cyanosis, no clubbing SKIN: warm, nondiaphoretic, normal turgor, no  ulcers NEUROPSYCH: alert, oriented to person, place, and time, sensory/motor grossly intact, normal mood, appropriate affect  Recent Labs: 06/26/2015: ALT 19 11/14/2015: BUN 11; Creatinine, Ser 0.50; Potassium 4.3; Sodium 136   Lipid Panel    Component Value Date/Time   CHOL 206* 06/26/2015 1323   TRIG 156.0* 06/26/2015 1323   HDL 63.80 06/26/2015 1323   CHOLHDL 3 06/26/2015 1323   VLDL 31.2 06/26/2015 1323   LDLCALC 111* 06/26/2015 1323   LDLDIRECT 130.7 04/13/2013 0807     Other studies Reviewed:  EKG:  The ekg from 12/11/2015 was personally reviewed by me and it revealed sinus rhythm, 87 BPM.  Additional studies/ records that were reviewed personally reviewed by me today include: None available  ASSESSMENT AND PLAN: Possible loss of consciousness Feeling hot, clammy, and room spinning sensation, nausea The first episode may have been multifactorial: She was ill for bronchitis, has not eaten well prior to going  to the pharmacy on top of being diabetic The second episode at work may have been related to a vagal response Risk factors for CAD include mainly diabetes, obesity. There is family history of CAD but not premature CAD. Recommend echocardiogram and exercise nuclear stress test for ruling out ischemia and patient reassurance.  Hyperlipidemia PCP has her on statins. Recommended LDL goal is less than 70 in light of her diabetes.  Obesity Body mass index is 33.17 kg/(m^2).Marland Kitchen. Recommend aggressive weight loss through diet and increased physical activity. Once cardiac workup is completed.  Current medicines are reviewed at length with the patient today.  The patient does not have concerns regarding medicines.  Labs/ tests ordered today include:  Orders Placed This Encounter  Procedures  . NM Myocar Multi W/Spect W/Wall Motion / EF  . EKG 12-Lead  . ECHOCARDIOGRAM COMPLETE    I had a lengthy and detailed discussion with the patient regarding diagnoses, prognosis,  diagnostic options, treatment options .   I counseled the patient on importance of lifestyle modification including heart healthy diet, regular physical activity Once cardiac workup complete.   Disposition:   FU with undersigned after tests   Signed, Almond LintAileen Jillyn Stacey, MD  12/11/2015 10:30 AM     [facility]

## 2015-12-11 NOTE — Patient Instructions (Addendum)
Medication Instructions:  Your physician recommends that you continue on your current medications as directed. Please refer to the Current Medication list given to you today.   Labwork: None ordered  Testing/Procedures: Your physician has requested that you have an echocardiogram. Echocardiography is a painless test that uses sound waves to create images of your heart. It provides your doctor with information about the size and shape of your heart and how well your heart's chambers and valves are working. This procedure takes approximately one hour. There are no restrictions for this procedure.  Date & Time: _______________________________________________________________________  [facility] : [facility]  Your caregiver has ordered a Stress Test with nuclear imaging. The purpose of this test is to evaluate the blood supply to your heart muscle. This procedure is referred to as a "Non-Invasive Stress Test." This is because other than having an IV started in your vein, nothing is inserted or "invades" your body. Cardiac stress tests are done to find areas of poor blood flow to the heart by determining the extent of coronary artery disease (CAD). Some patients exercise on a treadmill, which naturally increases the blood flow to your heart, while others who are  unable to walk on a treadmill due to physical limitations have a pharmacologic/chemical stress agent called Lexiscan . This medicine will mimic walking on a treadmill by temporarily increasing your coronary blood flow.   Please note: these test may take anywhere between 2-4 hours to complete  PLEASE REPORT TO [facility]TR[facility]  THE VOLUNTEERS AT THE FIRST DESK WILL DIRECT YOU WHERE TO GO  Date of Procedure:__Friday Dec 21, 2015 at 08:00 AM_________  Arrival Time for Procedure:____Arrive at 07:45AM to register_______  Instructions regarding medication:   __X__ : Hold diabetes medication Metformin the night before and for 2 days following  procedure.     PLEASE NOTIFY THE OFFICE AT LEAST 24 HOURS IN ADVANCE IF YOU ARE UNABLE TO KEEP YOUR APPOINTMENT.  [phone] AND  PLEASE NOTIFY NUCLEAR MEDICINE AT [facility] AT LEAST 24 HOURS IN ADVANCE IF YOU ARE UNABLE TO KEEP YOUR APPOINTMENT. [phone]  How to prepare for your Myoview test:   Do not eat or drink after midnight  No caffeine for 24 hours prior to test  No smoking 24 hours prior to test.  Your medication may be taken with water.  If your doctor stopped a medication because of this test, do not take that medication.  Ladies, please do not wear dresses.  Skirts or pants are appropriate. Please wear a short sleeve shirt.  No perfume, cologne or lotion.  Wear comfortable walking shoes. No heels!     Follow-Up: Your physician recommends that you schedule a follow-up appointment after testing to review results.  Date & Time: _________________________________________________________________   Any Other Special Instructions Will Be Listed Below (If Applicable).     If you need a refill on your cardiac medications before your next appointment, please call your pharmacy.  Echocardiogram An echocardiogram, or echocardiography, uses sound waves (ultrasound) to produce an image of your heart. The echocardiogram is simple, painless, obtained within a short period of time, and offers valuable information to your health care provider. The images from an echocardiogram can provide information such as:  Evidence of coronary artery disease (CAD).  Heart size.  Heart muscle function.  Heart valve function.  Aneurysm detection.  Evidence of a past heart attack.  Fluid buildup around the heart.  Heart muscle thickening.  Assess heart valve function. LET [facility] CARE PROVIDER  KNOW ABOUT:  Any allergies you have.  All medicines you are taking, including vitamins, herbs, eye drops, creams, and over-the-counter medicines.  Previous problems you or members  of your family have had with the use of anesthetics.  Any blood disorders you have.  Previous surgeries you have had.  Medical conditions you have.  Possibility of pregnancy, if this applies. BEFORE THE PROCEDURE  No special preparation is needed. Eat and drink normally.  PROCEDURE   In order to produce an image of your heart, gel will be applied to your chest and a wand-like tool (transducer) will be moved over your chest. The gel will help transmit the sound waves from the transducer. The sound waves will harmlessly bounce off your heart to allow the heart images to be captured in real-time motion. These images will then be recorded.  You may need an IV to receive a medicine that improves the quality of the pictures. AFTER THE PROCEDURE You may return to your normal schedule including diet, activities, and medicines, unless your health care provider tells you otherwise.   This information is not intended to replace advice given to you by your health care provider. Make sure you discuss any questions you have with your health care provider.   Document Released: 07/18/2000 Document Revised: 08/11/2014 Document Reviewed: 03/28/2013 Elsevier Interactive Patient Education 2016 [employer]   Cardiac Nuclear Scanning A cardiac nuclear scan is used to check your heart for problems, such as the following:  A portion of the heart is not getting enough blood.  Part of the heart muscle has died, which happens with a heart attack.  The heart wall is not working normally.  In this test, a radioactive dye (tracer) is injected into your bloodstream. After the tracer has traveled to your heart, a scanning device is used to measure how much of the tracer is absorbed by or distributed to various areas of your heart. [facility] KNOW ABOUT:  Any allergies you have.  All medicines you are taking, including vitamins, herbs, eye drops, creams, and over-the-counter  medicines.  Previous problems you or members of your family have had with the use of anesthetics.  Any blood disorders you have.  Previous surgeries you have had.  Medical conditions you have.  RISKS AND COMPLICATIONS Generally, this is a safe procedure. However, as with any procedure, problems can occur. Possible problems include:   Serious chest pain.  Rapid heartbeat.  Sensation of warmth in your chest. This usually passes quickly. BEFORE THE PROCEDURE Ask your health care provider about changing or stopping your regular medicines. PROCEDURE This procedure is usually done at a hospital and takes 2-4 hours.  An IV tube is inserted into one of your veins.  Your health care provider will inject a small amount of radioactive tracer through the tube.  You will then wait for 20-40 minutes while the tracer travels through your bloodstream.  You will lie down on an exam table so images of your heart can be taken. Images will be taken for about 15-20 minutes.  You will exercise on a treadmill or stationary bike. While you exercise, your heart activity will be monitored with an electrocardiogram (ECG), and your blood pressure will be checked.  If you are unable to exercise, you may be given a medicine to make your heart beat faster.  When blood flow to your heart has peaked, tracer will again be injected through the IV tube.  After 20-40 minutes, you will  get back on the exam table and have more images taken of your heart.  When the procedure is over, your IV tube will be removed. AFTER THE PROCEDURE  You will likely be able to leave shortly after the test. Unless your health care provider tells you otherwise, you may return to your normal schedule, including diet, activities, and medicines.  Make sure you find out how and when you will get your test results.   This information is not intended to replace advice given to you by your health care provider. Make sure you discuss  any questions you have with your health care provider.   Document Released: 08/15/2004 Document Revised: 07/26/2013 Document Reviewed: 06/29/2013 Elsevier Interactive Patient Education [employer]

## 2015-12-14 ENCOUNTER — Encounter: Payer: Self-pay | Admitting: Cardiology

## 2015-12-17 ENCOUNTER — Telehealth: Payer: Self-pay | Admitting: *Deleted

## 2015-12-17 NOTE — Telephone Encounter (Signed)
Spoke with patient regarding email about stress test that was ordered for her. Patient states that she has called the billing department and has agreed to have the test done and make monthly payments.

## 2015-12-17 NOTE — Telephone Encounter (Signed)
Please advise pt that stress test was recommended to work up her risk for CAD in light of her symptoms and risk factors. Obviously, we do not want her to be burdened by financial responsibilities related to testing. Ultimately, it will be up to her to decide to proceed with the test or not. Also, if she has decided to proceed, I am not sure if depending on her coverage, if she can avail of lower out of pocket cost in another facility.

## 2015-12-19 ENCOUNTER — Telehealth: Payer: Self-pay | Admitting: Cardiology

## 2015-12-19 NOTE — Telephone Encounter (Signed)
Left detailed voicemail message with instructions on upcoming stress test. Left date, time, location, and instructions to call back if any further questions.

## 2015-12-20 ENCOUNTER — Telehealth: Payer: Self-pay | Admitting: Cardiology

## 2015-12-20 NOTE — Telephone Encounter (Signed)
Spoke w/ pt.  Advised her that per [facility] [place], their policy states that b/c pt is under age 55 and her last menstrual period was < 2 yrs, she will need to come in early for urine pregnancy test prior to her [place] stress test. Pt states "this is bullsh*t" and that she "will not pay for a pregnancy test when she is 55 years old". She asks for the number of someone to speak w/ regarding this.  Provided her # to [place] and asked to call back if we can be of further assistance.

## 2015-12-20 NOTE — Telephone Encounter (Signed)
Nurse calling regarding this pt having an stress test tomorrow. Due to her last menstrual cycle date, she needs to have a pregnancy test done before her test tomorrow. Please call.

## 2015-12-21 ENCOUNTER — Encounter
Admission: RE | Admit: 2015-12-21 | Discharge: 2015-12-21 | Disposition: A | Discharge status: Home / Self Care | Payer: BLUE CROSS/BLUE SHIELD | Source: Ambulatory Visit | Attending: Cardiology | Admitting: Cardiology

## 2015-12-21 ENCOUNTER — Other Ambulatory Visit: Payer: Self-pay | Admitting: *Deleted

## 2015-12-21 DIAGNOSIS — R404 Transient alteration of awareness: Secondary | ICD-10-CM

## 2015-12-21 DIAGNOSIS — R4020 Unspecified coma: Secondary | ICD-10-CM

## 2015-12-21 DIAGNOSIS — Z01810 Encounter for preprocedural cardiovascular examination: Secondary | ICD-10-CM

## 2015-12-21 LAB — NM MYOCAR MULTI W/SPECT W/WALL MOTION / EF
CHL CUP NUCLEAR SDS: 1
CHL CUP NUCLEAR SSS: 1
CSEPEW: 9.9 METS
CSEPHR: 92 %
CSEPPHR: 153 {beats}/min
Exercise duration (min): 8 min
Exercise duration (sec): 0 s
LV sys vol: 29 mL
LVDIAVOL: 93 mL (ref 46–106)
Rest HR: 91 {beats}/min
SRS: 0
TID: 0.98

## 2015-12-21 LAB — PREGNANCY, URINE: PREG TEST UR: NEGATIVE

## 2015-12-21 MED ORDER — TECHNETIUM TC 99M TETROFOSMIN IV KIT
32.5960 | PACK | Freq: Once | INTRAVENOUS | Status: AC | PRN
  Administered 2015-12-21: 32.596 via INTRAVENOUS

## 2015-12-21 MED ORDER — TECHNETIUM TC 99M TETROFOSMIN IV KIT
13.8830 | PACK | Freq: Once | INTRAVENOUS | Status: AC | PRN
  Administered 2015-12-21: 13.883 via INTRAVENOUS

## 2015-12-24 ENCOUNTER — Telehealth: Payer: Self-pay | Admitting: Cardiovascular Disease

## 2015-12-24 ENCOUNTER — Telehealth: Payer: Self-pay | Admitting: Cardiology

## 2015-12-24 NOTE — Telephone Encounter (Signed)
Pt called back regarding lexi results and scheduled echo. She inquires if she still needs echo. Reviewed each test and reasons for each. Pt agreeable to keeping echo appt. She is appreciative of the information. Asks questions regarding billing of each test. Provided [place] billing department number. She states she will call them for further information.

## 2015-12-24 NOTE — Telephone Encounter (Signed)
error 

## 2015-12-27 ENCOUNTER — Other Ambulatory Visit: Payer: Self-pay

## 2015-12-27 ENCOUNTER — Ambulatory Visit (INDEPENDENT_AMBULATORY_CARE_PROVIDER_SITE_OTHER): Payer: BLUE CROSS/BLUE SHIELD

## 2015-12-27 DIAGNOSIS — R4020 Unspecified coma: Secondary | ICD-10-CM

## 2015-12-27 DIAGNOSIS — R404 Transient alteration of awareness: Secondary | ICD-10-CM

## 2016-01-01 ENCOUNTER — Telehealth: Payer: Self-pay | Admitting: *Deleted

## 2016-01-01 NOTE — Telephone Encounter (Signed)
[facility] in reference to a varicose vain from this weekend. Patient stated that she has no clot symptoms. She requested to have a call for consult, to make sure she's treating the issue correctly.

## 2016-01-01 NOTE — Telephone Encounter (Signed)
Patient stated that she has varicose behind right knee that became bruised over the week end and swelled to size of an egg. Patient stated she has had recent CT that showed no clots. Patient has rested leg with elevation used compression stocking and applied ice . Swelling has subsided and leg no longer tender patient just would like to know if she is doing the right things? Denies any swelling today and denies any pain to area today.

## 2016-01-01 NOTE — Telephone Encounter (Signed)
Olegario MessierKathy please advise patient. thanks

## 2016-01-01 NOTE — Telephone Encounter (Signed)
Yes she appropriately treated a hematoma

## 2016-01-01 NOTE — Telephone Encounter (Signed)
Patient notified

## 2016-01-09 ENCOUNTER — Encounter: Payer: Self-pay | Admitting: Cardiology

## 2016-01-09 ENCOUNTER — Ambulatory Visit (INDEPENDENT_AMBULATORY_CARE_PROVIDER_SITE_OTHER): Payer: BLUE CROSS/BLUE SHIELD | Admitting: Cardiology

## 2016-01-09 VITALS — BP 152/92 | HR 94 | Ht 68.0 in | Wt 227.0 lb

## 2016-01-09 DIAGNOSIS — R4020 Unspecified coma: Secondary | ICD-10-CM

## 2016-01-09 DIAGNOSIS — IMO0001 Reserved for inherently not codable concepts without codable children: Secondary | ICD-10-CM

## 2016-01-09 DIAGNOSIS — R030 Elevated blood-pressure reading, without diagnosis of hypertension: Secondary | ICD-10-CM

## 2016-01-09 DIAGNOSIS — R404 Transient alteration of awareness: Secondary | ICD-10-CM | POA: Diagnosis not present

## 2016-01-09 DIAGNOSIS — E785 Hyperlipidemia, unspecified: Secondary | ICD-10-CM | POA: Diagnosis not present

## 2016-01-09 NOTE — Patient Instructions (Addendum)
Medication Instructions:  Your physician recommends that you continue on your current medications as directed. Please refer to the Current Medication list given to you today.   Labwork: None ordered  Testing/Procedures: None ordered  Follow-Up: Your physician recommends that you schedule a follow-up appointment as needed.    Any Other Special Instructions Will Be Listed Below (If Applicable). Your physician has requested that you regularly monitor and record your blood pressure readings at home. Please use the same machine at the same time of day to check your readings and record them to bring to your follow-up visit.      If you need a refill on your cardiac medications before your next appointment, please call your pharmacy.  How to Take Your Blood Pressure HOW DO I GET A BLOOD PRESSURE MACHINE?  You can buy an electronic home blood pressure machine at your local pharmacy. Insurance will sometimes cover the cost if you have a prescription.  Ask your doctor what type of machine is best for you. There are different machines for your arm and your wrist.  If you decide to buy a machine to check your blood pressure on your arm, first check the size of your arm so you can buy the right size cuff. To check the size of your arm:   Use a measuring tape that shows both inches and centimeters.   Wrap the measuring tape around the upper-middle part of your arm. You may need someone to help you measure.   Write down your arm measurement in both inches and centimeters.   To measure your blood pressure correctly, it is important to have the right size cuff.   If your arm is up to 13 inches (up to 34 centimeters), get an adult cuff size.  If your arm is 13 to 17 inches (35 to 44 centimeters), get a large adult cuff size.    If your arm is 17 to 20 inches (45 to 52 centimeters), get an adult thigh cuff.  WHAT DO THE NUMBERS MEAN?   There are two numbers that make up your blood  pressure. For example: 120/80.  The first number (120 in our example) is called the "systolic pressure." It is a measure of the pressure in your blood vessels when your heart is pumping blood.  The second number (80 in our example) is called the "diastolic pressure." It is a measure of the pressure in your blood vessels when your heart is resting between beats.  Your doctor will tell you what your blood pressure should be. WHAT SHOULD I DO BEFORE I CHECK MY BLOOD PRESSURE?   Try to rest or relax for at least 30 minutes before you check your blood pressure.  Do not smoke.  Do not have any drinks with caffeine, such as:  Soda.  Coffee.  Tea.  Check your blood pressure in a quiet room.  Sit down and stretch out your arm on a table. Keep your arm at about the level of your heart. Let your arm relax.  Make sure that your legs are not crossed. HOW DO I CHECK MY BLOOD PRESSURE?  Follow the directions that came with your machine.  Make sure you remove any tight-fitting clothing from your arm or wrist. Wrap the cuff around your upper arm or wrist. You should be able to fit a finger between the cuff and your arm. If you cannot fit a finger between the cuff and your arm, it is too tight and should be  removed and rewrapped.  Some units require you to manually pump up the arm cuff.  Automatic units inflate the cuff when you press a button.  Cuff deflation is automatic in both models.  After the cuff is inflated, the unit measures your blood pressure and pulse. The readings are shown on a monitor. Hold still and breathe normally while the cuff is inflated.  Getting a reading takes less than a minute.  Some models store readings in a memory. Some provide a printout of readings. If your machine does not store your readings, keep a written record.  Take readings with you to your next visit with your doctor.   This information is not intended to replace advice given to you by your health  care provider. Make sure you discuss any questions you have with your health care provider.   Document Released: 07/03/2008 Document Revised: 08/11/2014 Document Reviewed: 09/15/2013 Elsevier Interactive Patient Education 2016 [employer] Inc.   Blood Pressure Record Sheet Your blood pressure on this visit to the emergency department or clinic is elevated. This does not necessarily mean you have high blood pressure (hypertension), but it does mean that your blood pressure needs to be rechecked. Many times your blood pressure can increase due to illness, pain, anxiety, or other factors. We recommend that you get a series of blood pressure readings done over a period of 5 days. It is best to get a reading in the morning and one in the evening. You should make sure to sit and relax for 1-5 minutes before the reading is taken. Write the readings down and make a follow-up appointment with your health care provider to discuss the results. If there is not a free clinic or a drug store with a blood-pressure-taking machine near you, you can purchase blood-pressure-taking equipment from a drug store. Having one in the home allows you the convenience of taking your blood pressure while you are home and relaxed.  Your blood pressure in the emergency department or clinic on ________ was ____________________. BLOOD PRESSURE LOG Date: _______________________  a.m. _____________________  p.m. _____________________ Date: _______________________  a.m. _____________________  p.m. _____________________ Date: _______________________  a.m. _____________________  p.m. _____________________ Date: _______________________  a.m. _____________________  p.m. _____________________ Date: _______________________  a.m. _____________________  p.m. _____________________   This information is not intended to replace advice given to you by your health care provider. Make sure you discuss any questions you have with  your health care provider.   Document Released: 04/19/2003 Document Revised: 08/11/2014 Document Reviewed: 09/13/2013 Elsevier Interactive Patient Education [employer]

## 2016-01-09 NOTE — Progress Notes (Signed)
Cardiology Office Note   Date:  01/09/2016   ID:  Rachel Riggs, DOB 12-13-1960, MRN [id number]  Referring Doctor:  Sherlene Shams, MD   Cardiologist:   Almond Lint, MD   Reason for consultation:  Chief Complaint  Patient presents with  . other    F/U after Echo. Medications verbally reviewed with patient.       History of Present Illness: Rachel Riggs is a 55 y.o. female who presents for ffup after tests.  Patient reports no recurrence of feeling faint or lightheadedness or passing out. She denies chest pain and shortness of breath. She has been having issues with her varicose veins and will be seeing a specialist that she previously saw.  No headache, fever, cough, colds, abdominal pain. No PND, orthopnea, edema.   ROS:  Please see the history of present illness. Aside from mentioned under HPI, all other systems are reviewed and negative.    Past Medical History  Diagnosis Date  . Asthma   . Hyperlipidemia   . Migraines   . Diabetes mellitus without complication [facility])     Past Surgical History  Procedure Laterality Date  . Hernia repair  1991     reports that she quit smoking about 31 years ago. She has never used smokeless tobacco. She reports that she drinks about 0.6 oz of alcohol per week. She reports that she does not use illicit drugs.   family history includes COPD in her paternal grandfather; Cancer in her mother; Cancer (age of onset: 44) in her brother; Diabetes in her father, father, and maternal grandmother; Heart disease in her paternal grandfather; Heart disease (age of onset: 41) in her father; Heart failure in her mother; Hyperlipidemia in her father; Hypertension in her father; Parkinsonism (age of onset: 21) in her mother; Stroke in her maternal grandmother.   Current Outpatient Prescriptions  Medication Sig Dispense Refill  . albuterol (VENTOLIN HFA) 108 (90 Base) MCG/ACT inhaler Inhale 2 puffs into the lungs every 6 (six) hours as  needed for wheezing. 1 Inhaler 2  . atorvastatin (LIPITOR) 20 MG tablet Take 1 tablet (20 mg total) by mouth daily. 90 tablet 3  . B Complex-C (B-COMPLEX WITH VITAMIN C) tablet Take 1 tablet by mouth daily.    . busPIRone (BUSPAR) 7.5 MG tablet Take 1 tablet (7.5 mg total) by mouth 3 (three) times daily. 90 tablet 1  . butalbital-acetaminophen-caffeine (FIORICET, ESGIC) 50-325-40 MG per tablet TAKE ONE TO TWO TABLETS BY MOUTH EVERY 6 HOURS AS NEEDED FOR HEADACHE 90 tablet 2  . cetirizine (ZYRTEC) 10 MG tablet Take 10 mg by mouth daily as needed for allergies.    Marland Kitchen etodolac (LODINE) 500 MG tablet Take 1 tablet (500 mg total) by mouth 2 (two) times daily. 60 tablet 3  . meclizine (ANTIVERT) 25 MG tablet Take 1 tablet (25 mg total) by mouth 3 (three) times daily as needed. 30 tablet 0  . metFORMIN (GLUCOPHAGE) 500 MG tablet TAKE ONE TABLET BY MOUTH TWICE DAILY WITH MEALS 180 tablet 3  . MICROGESTIN 1-20 MG-MCG tablet TAKE ONE TABLET BY MOUTH ONCE DAILY 21 tablet 11   No current facility-administered medications for this visit.    Allergies: Amoxicillin; Aspirin; Ibuprofen; Penicillins; and Sulfa antibiotics    PHYSICAL EXAM: VS:  BP 152/92 mmHg  Pulse 94  Ht  (1.727 m)  Wt 227 lb (102.967 kg)  BMI 34.52 kg/m2  SpO2 98%  LMP 12/20/2012 (Approximate) , Body mass index  is 34.52 kg/(m^2). Wt Readings from Last 3 Encounters:  01/09/16 227 lb (102.967 kg)  12/11/15 218 lb 1.9 oz (98.939 kg)  11/14/15 216 lb 8 oz (98.204 kg)    GENERAL:  well developed, well nourished, obese, not in acute distress HEENT: normocephalic, pink conjunctivae, anicteric sclerae, no xanthelasma, normal dentition, oropharynx clear NECK:  no neck vein engorgement, JVP normal, no hepatojugular reflux, carotid upstroke brisk and symmetric, no bruit, no thyromegaly, no lymphadenopathy LUNGS:  good respiratory effort, clear to auscultation bilaterally CV:  PMI not displaced, no thrills, no lifts, S1 and S2 within  normal limits, no palpable S3 or S4, no murmurs, no rubs, no gallops ABD:  Soft, nontender, nondistended, normoactive bowel sounds, no abdominal aortic bruit, no hepatomegaly, no splenomegaly MS: nontender back, no kyphosis, no scoliosis, no joint deformities EXT:  2+ DP/PT pulses, no edema, no varicosities, no cyanosis, no clubbing SKIN: warm, nondiaphoretic, normal turgor, no ulcers NEUROPSYCH: alert, oriented to person, place, and time, sensory/motor grossly intact, normal mood, appropriate affect  Recent Labs: 06/26/2015: ALT 19 11/14/2015: BUN 11; Creatinine, Ser 0.50; Potassium 4.3; Sodium 136   Lipid Panel    Component Value Date/Time   CHOL 206* 06/26/2015 1323   TRIG 156.0* 06/26/2015 1323   HDL 63.80 06/26/2015 1323   CHOLHDL 3 06/26/2015 1323   VLDL 31.2 06/26/2015 1323   LDLCALC 111* 06/26/2015 1323   LDLDIRECT 130.7 04/13/2013 0807     Other studies Reviewed:  EKG:  The ekg from 12/11/2015 was personally reviewed by me and it revealed sinus rhythm, 87 BPM.  Additional studies/ records that were reviewed personally reviewed by me today include:  Echo 12/27/2015: - Left ventricle: The cavity size was normal. Wall thickness was  normal. Systolic function was normal. The estimated ejection  fraction was in the range of 50% to 55%. Wall motion was normal;  there were no regional wall motion abnormalities. Doppler  parameters are consistent with abnormal left ventricular  relaxation (grade 1 diastolic dysfunction). - Left atrium: The atrium was mildly dilated  Stress test 12/21/2015:  Blood pressure demonstrated a hypertensive response to exercise.181/86  No T wave inversion was noted during stress.  There was no ST segment deviation noted during stress.  Exercise myocardial perfusion imaging study with no significant ischemia Normal wall motion, EF estimated at 53% No EKG changes concerning for ischemia at peak stress or in recovery. Target heart rate  achieved with good exercise tolerance Low risk scan    ASSESSMENT AND PLAN: Possible loss of consciousness Feeling hot, clammy, and room spinning sensation, nausea The first episode may have been multifactorial: She was ill for bronchitis, has not eaten well prior to going to the pharmacy on top of being diabetic The second episode at work may have been related to a vagal response Risk factors for CAD include mainly diabetes, obesity. There is family history of CAD but not premature CAD. Stress is was negative for ischemia. Heart function normal on echocardiogram. Findings discussed with patient. Patient reassured. Patient to follow up with PCP.  Hyperlipidemia PCP has her on statins. Recommended LDL goal is less than 70 in light of her diabetes.  Obesity Body mass index is 34.52 kg/(m^2).Marland Kitchen Recommend aggressive weight loss through diet and increased physical activity. Once cardiac workup is completed.  Elevated blood pressure Patient has no established diagnosis of hypertension. According to her, her usual blood pressures in the 130s over 80s. Recommend BP log.  Current medicines are reviewed at length with the  patient today.  The patient does not have concerns regarding medicines.  Labs/ tests ordered today include:  No orders of the defined types were placed in this encounter.    I had a lengthy and detailed discussion with the patient regarding diagnoses, prognosis, diagnostic options, treatment options .   I counseled the patient on importance of lifestyle modification including heart healthy diet, regular physical activity.   Disposition:   FU with undersigned when necessary  I spent at least 25 minutes with the patient today and more than 50% of the time was spent counseling the patient and coordinating care.       Signed, Almond LintAileen Mckay Tegtmeyer, MD  01/09/2016 11:13 AM    [facility]

## 2016-01-10 ENCOUNTER — Encounter: Payer: Self-pay | Admitting: Internal Medicine

## 2016-01-12 ENCOUNTER — Encounter: Payer: Self-pay | Admitting: Internal Medicine

## 2016-02-16 ENCOUNTER — Encounter: Payer: Self-pay | Admitting: Internal Medicine

## 2016-02-19 DIAGNOSIS — E6609 Other obesity due to excess calories: Secondary | ICD-10-CM | POA: Diagnosis not present

## 2016-02-19 DIAGNOSIS — Z6834 Body mass index (BMI) 34.0-34.9, adult: Secondary | ICD-10-CM | POA: Diagnosis not present

## 2016-02-25 ENCOUNTER — Other Ambulatory Visit: Payer: Self-pay | Admitting: Internal Medicine

## 2016-02-25 DIAGNOSIS — Z1231 Encounter for screening mammogram for malignant neoplasm of breast: Secondary | ICD-10-CM

## 2016-02-28 ENCOUNTER — Encounter: Payer: Self-pay | Admitting: Internal Medicine

## 2016-03-04 DIAGNOSIS — Z6834 Body mass index (BMI) 34.0-34.9, adult: Secondary | ICD-10-CM | POA: Diagnosis not present

## 2016-03-04 DIAGNOSIS — E6609 Other obesity due to excess calories: Secondary | ICD-10-CM | POA: Diagnosis not present

## 2016-03-24 ENCOUNTER — Ambulatory Visit
Admission: RE | Admit: 2016-03-24 | Discharge: 2016-03-24 | Disposition: A | Discharge status: Home / Self Care | Payer: BLUE CROSS/BLUE SHIELD | Source: Ambulatory Visit | Attending: Internal Medicine | Admitting: Internal Medicine

## 2016-03-24 DIAGNOSIS — Z1231 Encounter for screening mammogram for malignant neoplasm of breast: Secondary | ICD-10-CM

## 2016-04-01 DIAGNOSIS — E6609 Other obesity due to excess calories: Secondary | ICD-10-CM | POA: Diagnosis not present

## 2016-04-01 DIAGNOSIS — Z6831 Body mass index (BMI) 31.0-31.9, adult: Secondary | ICD-10-CM | POA: Diagnosis not present

## 2016-05-01 ENCOUNTER — Encounter: Payer: Self-pay | Admitting: Internal Medicine

## 2016-05-03 ENCOUNTER — Encounter: Payer: Self-pay | Admitting: Internal Medicine

## 2016-05-03 DIAGNOSIS — H524 Presbyopia: Secondary | ICD-10-CM | POA: Diagnosis not present

## 2016-05-04 ENCOUNTER — Other Ambulatory Visit: Payer: Self-pay | Admitting: Internal Medicine

## 2016-05-08 ENCOUNTER — Encounter: Payer: Self-pay | Admitting: Internal Medicine

## 2016-05-08 DIAGNOSIS — Z23 Encounter for immunization: Secondary | ICD-10-CM | POA: Diagnosis not present

## 2016-06-17 ENCOUNTER — Encounter: Payer: Self-pay | Admitting: Internal Medicine

## 2016-06-18 ENCOUNTER — Other Ambulatory Visit: Payer: Self-pay | Admitting: Internal Medicine

## 2016-06-18 DIAGNOSIS — E119 Type 2 diabetes mellitus without complications: Secondary | ICD-10-CM

## 2016-06-18 DIAGNOSIS — E7800 Pure hypercholesterolemia, unspecified: Secondary | ICD-10-CM

## 2016-06-18 MED ORDER — PHENTERMINE HCL 37.5 MG PO TABS: 37.5000 mg | ORAL_TABLET | Freq: Every day | ORAL | 0 refills | Status: DC

## 2016-06-29 ENCOUNTER — Other Ambulatory Visit: Payer: Self-pay | Admitting: Internal Medicine

## 2016-07-01 ENCOUNTER — Other Ambulatory Visit (INDEPENDENT_AMBULATORY_CARE_PROVIDER_SITE_OTHER): Payer: BLUE CROSS/BLUE SHIELD

## 2016-07-01 DIAGNOSIS — E7800 Pure hypercholesterolemia, unspecified: Secondary | ICD-10-CM

## 2016-07-01 DIAGNOSIS — E119 Type 2 diabetes mellitus without complications: Secondary | ICD-10-CM

## 2016-07-01 LAB — COMPREHENSIVE METABOLIC PANEL
ALT: 11 U/L (ref 0–35)
AST: 15 U/L (ref 0–37)
Albumin: 3.8 g/dL (ref 3.5–5.2)
Alkaline Phosphatase: 126 U/L — ABNORMAL HIGH (ref 39–117)
BUN: 10 mg/dL (ref 6–23)
CALCIUM: 9.3 mg/dL (ref 8.4–10.5)
CHLORIDE: 103 meq/L (ref 96–112)
CO2: 24 meq/L (ref 19–32)
Creatinine, Ser: 0.63 mg/dL (ref 0.40–1.20)
GFR: 103.97 mL/min (ref >60.00)
GLUCOSE: 109 mg/dL — AB (ref 70–99)
Potassium: 4.4 mEq/L (ref 3.5–5.1)
Sodium: 137 mEq/L (ref 135–145)
Total Bilirubin: 0.8 mg/dL (ref 0.2–1.2)
Total Protein: 6.8 g/dL (ref 6.0–8.3)

## 2016-07-01 LAB — LIPID PANEL
CHOLESTEROL: 199 mg/dL (ref 0–200)
HDL: 67.3 mg/dL (ref >39.00)
LDL Cholesterol: 97 mg/dL (ref 0–99)
NonHDL: 132.18
TRIGLYCERIDES: 178 mg/dL — AB (ref 0.0–149.0)
Total CHOL/HDL Ratio: 3
VLDL: 35.6 mg/dL (ref 0.0–40.0)

## 2016-07-01 LAB — MICROALBUMIN / CREATININE URINE RATIO
Creatinine,U: 160 mg/dL
Microalb Creat Ratio: 1.2 mg/g (ref 0.0–30.0)
Microalb, Ur: 1.9 mg/dL (ref 0.0–1.9)

## 2016-07-01 LAB — LDL CHOLESTEROL, DIRECT: LDL DIRECT: 107 mg/dL

## 2016-07-01 LAB — HEMOGLOBIN A1C: Hgb A1c MFr Bld: 6 % (ref 4.6–6.5)

## 2016-07-07 ENCOUNTER — Encounter: Payer: Self-pay | Admitting: *Deleted

## 2016-07-07 NOTE — Progress Notes (Signed)
Letter mailed

## 2016-07-17 ENCOUNTER — Other Ambulatory Visit: Payer: Self-pay | Admitting: Internal Medicine

## 2016-07-17 NOTE — Telephone Encounter (Signed)
Last filled 06/20/16. Last OV 11/14/15. Has schedule OV 08/05/16.

## 2016-07-18 NOTE — Telephone Encounter (Signed)
Refill for 30 days only.  OFFICE VISIT NEEDED prior to any more refills 

## 2016-08-02 ENCOUNTER — Other Ambulatory Visit: Payer: Self-pay | Admitting: Internal Medicine

## 2016-08-05 ENCOUNTER — Other Ambulatory Visit: Payer: BLUE CROSS/BLUE SHIELD | Admitting: Internal Medicine

## 2016-08-05 ENCOUNTER — Ambulatory Visit (INDEPENDENT_AMBULATORY_CARE_PROVIDER_SITE_OTHER): Payer: BLUE CROSS/BLUE SHIELD | Admitting: Internal Medicine

## 2016-08-05 VITALS — BP 138/86 | HR 100 | Temp 98.0°F | Resp 12 | Ht 67.25 in | Wt 208.2 lb

## 2016-08-05 DIAGNOSIS — E6609 Other obesity due to excess calories: Secondary | ICD-10-CM

## 2016-08-05 DIAGNOSIS — Z5309 Procedure and treatment not carried out because of other contraindication: Secondary | ICD-10-CM

## 2016-08-05 DIAGNOSIS — Z8709 Personal history of other diseases of the respiratory system: Secondary | ICD-10-CM | POA: Diagnosis not present

## 2016-08-05 DIAGNOSIS — Z6832 Body mass index (BMI) 32.0-32.9, adult: Secondary | ICD-10-CM

## 2016-08-05 DIAGNOSIS — E7800 Pure hypercholesterolemia, unspecified: Secondary | ICD-10-CM

## 2016-08-05 DIAGNOSIS — L509 Urticaria, unspecified: Secondary | ICD-10-CM | POA: Diagnosis not present

## 2016-08-05 DIAGNOSIS — Z0000 Encounter for general adult medical examination without abnormal findings: Secondary | ICD-10-CM

## 2016-08-05 DIAGNOSIS — E66811 Obesity, class 1: Secondary | ICD-10-CM

## 2016-08-05 DIAGNOSIS — E119 Type 2 diabetes mellitus without complications: Secondary | ICD-10-CM | POA: Diagnosis not present

## 2016-08-05 MED ORDER — MONTELUKAST SODIUM 10 MG PO TABS: 10.0000 mg | ORAL_TABLET | Freq: Every day | ORAL | 3 refills | Status: DC

## 2016-08-05 MED ORDER — PHENTERMINE HCL 37.5 MG PO TABS: ORAL_TABLET | ORAL | 2 refills | Status: DC

## 2016-08-05 NOTE — Progress Notes (Signed)
Pre-visit discussion using our clinic review tool. No additional management support is needed unless otherwise documented below in the visit note.  

## 2016-08-05 NOTE — Assessment & Plan Note (Signed)
She has a histort of anaphylaxis to asa.

## 2016-08-05 NOTE — Patient Instructions (Addendum)
I have refilled your phentermine for 3 more months.   Please start getting 30 minutes of daily exercise.   I would like you to consider transitioning  your appetite suppressant from phentermine to Saxenda,  The only medicaition approved for long term management of increased appetite    We will repeat your PAP smear this year when you are NOT MENSTRUATING!

## 2016-08-05 NOTE — Progress Notes (Signed)
Patient ID: Rachel Riggs, female    DOB: 1961-04-17  Age: 56 y.o. MRN: [id number]  The patient is here for annual physical examination and management of other chronic and acute problems, including obesity and type 2 diabetes.  Since she has lost weight she has resumed having regular menses and is currently menstruating, PAP  Is due 2018.  There is a FH of cervical  Cancer in mother and grandmother    Mammogram august 2017  cologuard negative 2017   Lump on back for 20 years told it was lipoma    The risk factors are reflected in the social history.  The roster of all physicians providing medical care to patient - is listed in the Snapshot section of the chart.  Home safety : The patient has smoke detectors in the home. They wear seatbelts.  There are no firearms at home. There is no violence in the home.   There is no risks for hepatitis, STDs or HIV. There is no   history of blood transfusion. They have no travel history to infectious disease endemic areas of the world.  The patient has seen their dentist in the last six month. They have seen their eye doctor in the last year..  They do not  have excessive sun exposure. Discussed the need for sun protection: hats, long sleeves and use of sunscreen if there is significant sun exposure.   Diet: the importance of a healthy diet is discussed. They do have a healthy diet.  The benefits of regular aerobic exercise were discussed. She is not exercising    Depression screen: there are no signs or vegative symptoms of depression- irritability, change in appetite, anhedonia, sadness/tearfullness.   The following portions of the patient's history were reviewed and updated as appropriate: allergies, current medications, past family history, past medical history,  past surgical history, past social history  and problem list.  Visual acuity was not assessed per patient preference since she has regular follow up with her ophthalmologist. Hearing and  body mass index were assessed and reviewed.   During the course of the visit the patient was educated and counseled about appropriate screening and preventive services including : fall prevention , diabetes screening, nutrition counseling, colorectal cancer screening, and recommended immunizations.    CC: Diagnoses of Aspirin contraindicated, Diabetes mellitus without complication, Encounter for preventive health examination, History of asthma, Pure hypercholesterolemia, Hives of unknown origin, and Class 1 obesity due to excess calories with serious comorbidity and body mass index (BMI) of 32.0 to 32.9 in adult were pertinent to this visit.  Lost  weight down to 195 before the son's wedding,  Still using phentermine. Has been using the medication for the past   For the past 6 to 12 months    Psoriasis of interdigital spaces,  no prior dermatology eval,  Did not respond  to steroids  History Mailin has a past medical history of Asthma; Diabetes mellitus without complication (HCC); Hyperlipidemia; and Migraines.   She has a past surgical history that includes Hernia repair (1991).   Her family history includes COPD in her paternal grandfather; Cancer in her mother; Cancer (age of onset: 18) in her brother; Diabetes in her father and maternal grandmother; Heart disease in her paternal grandfather; Heart disease (age of onset: 64) in her father; Heart failure in her mother; Hyperlipidemia in her father; Hypertension in her father; Parkinsonism (age of onset: 73) in her mother; Stroke in her maternal grandmother.She reports that she quit smoking  about 32 years ago. She has never used smokeless tobacco. She reports that she drinks about 0.6 oz of alcohol per week . She reports that she does not use drugs.  Outpatient Medications Prior to Visit  Medication Sig Dispense Refill  . albuterol (VENTOLIN HFA) 108 (90 Base) MCG/ACT inhaler Inhale 2 puffs into the lungs every 6 (six) hours as needed for  wheezing. 1 Inhaler 2  . atorvastatin (LIPITOR) 20 MG tablet TAKE ONE TABLET BY MOUTH ONCE DAILY 90 tablet 3  . busPIRone (BUSPAR) 7.5 MG tablet Take 1 tablet (7.5 mg total) by mouth 3 (three) times daily. 90 tablet 1  . butalbital-acetaminophen-caffeine (FIORICET, ESGIC) 50-325-40 MG per tablet TAKE ONE TO TWO TABLETS BY MOUTH EVERY 6 HOURS AS NEEDED FOR HEADACHE 90 tablet 2  . etodolac (LODINE) 500 MG tablet Take 1 tablet (500 mg total) by mouth 2 (two) times daily. 60 tablet 3  . meclizine (ANTIVERT) 25 MG tablet Take 1 tablet (25 mg total) by mouth 3 (three) times daily as needed. 30 tablet 0  . metFORMIN (GLUCOPHAGE) 500 MG tablet TAKE ONE TABLET BY MOUTH TWICE DAILY WITH MEALS 180 tablet 3  . MICROGESTIN 1-20 MG-MCG tablet TAKE ONE TABLET BY MOUTH ONCE DAILY *NEEDS  OFFICE  VISIT  FOR  FURTHER  REFILLS* 63 tablet 5  . phentermine (ADIPEX-P) 37.5 MG tablet TAKE ONE TABLET BY MOUTH ONCE DAILY BEFORE BREAKFAST 30 tablet 0  . B Complex-C (B-COMPLEX WITH VITAMIN C) tablet Take 1 tablet by mouth daily.    . cetirizine (ZYRTEC) 10 MG tablet Take 10 mg by mouth daily as needed for allergies.     No facility-administered medications prior to visit.     Review of Systems   Patient denies headache, fevers, malaise, unintentional weight loss, skin rash, eye pain, sinus congestion and sinus pain, sore throat, dysphagia,  hemoptysis , cough, dyspnea, wheezing, chest pain, palpitations, orthopnea, edema, abdominal pain, nausea, melena, diarrhea, constipation, flank pain, dysuria, hematuria, urinary  Frequency, nocturia, numbness, tingling, seizures,  Focal weakness, Loss of consciousness,  Tremor, insomnia, depression, anxiety, and suicidal ideation.      Objective:  BP 138/86   Pulse 100   Temp 98 F (36.7 C) (Oral)   Resp 12   Ht 5' 7.25" (1.708 m)   Wt 208 lb 4 oz (94.5 kg)   SpO2 99%   BMI 32.37 kg/m   Physical Exam   General appearance: alert, cooperative and appears stated age Head:  Normocephalic, without obvious abnormality, atraumatic Eyes: conjunctivae/corneas clear. PERRL, EOM's intact. Fundi benign. Ears: normal TM's and external ear canals both ears Nose: Nares normal. Septum midline. Mucosa normal. No drainage or sinus tenderness. Throat: lips, mucosa, and tongue normal; teeth and gums normal Neck: no adenopathy, no carotid bruit, no JVD, supple, symmetrical, trachea midline and thyroid not enlarged, symmetric, no tenderness/mass/nodules Lungs: clear to auscultation bilaterally Breasts: pendulous breasts, normal appearance, no masses or tenderness Heart: regular rate and rhythm, S1, S2 normal, no murmur, click, rub or gallop Abdomen: soft, non-tender; bowel sounds normal; no masses,  no organomegaly Extremities: extremities normal, atraumatic, no cyanosis or edema Pulses: 2+ and symmetric Skin: scaling interdigital rash affecting both hands.  Neurologic: Alert and oriented X 3, normal strength and tone. Normal symmetric reflexes. Normal coordination and gait.      Assessment & Plan:   Problem List Items Addressed This Visit    Aspirin contraindicated    She has a histort of anaphylaxis to asa.  Diabetes mellitus without complication     well-controlled on current medications.  hemoglobin A1c has been consistently at or  less than 7.0 . Patient is up-to-date on eye exams and foot exam is normal today. Patient is due for urine microalbumin to creatinine ratio . Patient is tolerating statin therapy for CAD risk reduction .  Lab Results  Component Value Date   HGBA1C 6.0 07/01/2016   Lab Results  Component Value Date   MICROALBUR 1.9 07/01/2016           Encounter for preventive health examination    Annual comprehensive preventive exam was done as well as an evaluation and management of chronic conditions .  During the course of the visit the patient was educated and counseled about appropriate screening and preventive services including :   diabetes screening, lipid analysis with projected  10 year  risk for CAD , nutrition counseling, breast, cervical and colorectal cancer screening, and recommended immunizations.  Printed recommendations for health maintenance screenings was given.      History of asthma    Recommended adding singulair and second generation antihistamine      Hives of unknown origin    Recommend adding singulair and zyrtec for daily use as preventive       Hyperlipidemia    improved profile on  higher potency atorvastatin .  Lab Results  Component Value Date   CHOL 199 07/01/2016   HDL 67.30 07/01/2016   LDLCALC 97 07/01/2016   LDLDIRECT 107.0 07/01/2016   TRIG 178.0 (H) 07/01/2016   CHOLHDL 3 07/01/2016   Lab Results  Component Value Date   ALT 11 07/01/2016   AST 15 07/01/2016   ALKPHOS 126 (H) 07/01/2016   BILITOT 0.8 07/01/2016                Obesity    She has had success with phentermine but I discussed my concern about long term use. Recommending transition to saxenda if she is not at goal in 3 months       Relevant Medications   phentermine (ADIPEX-P) 37.5 MG tablet      I am having Ms. Lebron start on montelukast. I am also having her maintain her cetirizine, meclizine, butalbital-acetaminophen-caffeine, busPIRone, etodolac, albuterol, B-complex with vitamin C, metFORMIN, atorvastatin, MICROGESTIN, and phentermine.  Meds ordered this encounter  Medications  . montelukast (SINGULAIR) 10 MG tablet    Sig: Take 1 tablet (10 mg total) by mouth at bedtime.    Dispense:  30 tablet    Refill:  3  . phentermine (ADIPEX-P) 37.5 MG tablet    Sig: TAKE ONE TABLET BY MOUTH ONCE DAILY BEFORE BREAKFAST    Dispense:  30 tablet    Refill:  2    Medications Discontinued During This Encounter  Medication Reason  . phentermine (ADIPEX-P) 37.5 MG tablet Reorder    Follow-up: No Follow-up on file.   Sherlene ShamsULLO, Jovane Foutz L, MD

## 2016-08-06 NOTE — Assessment & Plan Note (Signed)
Recommended adding singulair and second generation antihistamine

## 2016-08-06 NOTE — Assessment & Plan Note (Signed)
improved profile on  higher potency atorvastatin .  Lab Results  Component Value Date   CHOL 199 07/01/2016   HDL 67.30 07/01/2016   LDLCALC 97 07/01/2016   LDLDIRECT 107.0 07/01/2016   TRIG 178.0 (H) 07/01/2016   CHOLHDL 3 07/01/2016   Lab Results  Component Value Date   ALT 11 07/01/2016   AST 15 07/01/2016   ALKPHOS 126 (H) 07/01/2016   BILITOT 0.8 07/01/2016

## 2016-08-06 NOTE — Assessment & Plan Note (Signed)
Recommend adding singulair and zyrtec for daily use as preventive

## 2016-08-06 NOTE — Assessment & Plan Note (Signed)
well-controlled on current medications.  hemoglobin A1c has been consistently at or  less than 7.0 . Patient is up-to-date on eye exams and foot exam is normal today. Patient is due for urine microalbumin to creatinine ratio . Patient is tolerating statin therapy for CAD risk reduction .  Lab Results  Component Value Date   HGBA1C 6.0 07/01/2016   Lab Results  Component Value Date   MICROALBUR 1.9 07/01/2016

## 2016-08-06 NOTE — Assessment & Plan Note (Signed)
Annual comprehensive preventive exam was done as well as an evaluation and management of chronic conditions .  During the course of the visit the patient was educated and counseled about appropriate screening and preventive services including :  diabetes screening, lipid analysis with projected  10 year  risk for CAD , nutrition counseling, breast, cervical and colorectal cancer screening, and recommended immunizations.  Printed recommendations for health maintenance screenings was given 

## 2016-08-06 NOTE — Assessment & Plan Note (Signed)
She has had success with phentermine but I discussed my concern about long term use. Recommending transition to saxenda if she is not at goal in 3 months

## 2016-09-01 ENCOUNTER — Telehealth: Payer: Self-pay | Admitting: Internal Medicine

## 2016-09-01 NOTE — Telephone Encounter (Signed)
PA was completed and sent to plan on 09/01/16.

## 2016-09-17 ENCOUNTER — Encounter: Payer: Self-pay | Admitting: Family

## 2016-09-17 ENCOUNTER — Ambulatory Visit (INDEPENDENT_AMBULATORY_CARE_PROVIDER_SITE_OTHER): Payer: BLUE CROSS/BLUE SHIELD | Admitting: Family

## 2016-09-17 VITALS — BP 132/78 | HR 97 | Temp 98.7°F | Ht 67.25 in | Wt 208.6 lb

## 2016-09-17 DIAGNOSIS — J40 Bronchitis, not specified as acute or chronic: Secondary | ICD-10-CM

## 2016-09-17 MED ORDER — BENZONATATE 100 MG PO CAPS: 100.0000 mg | ORAL_CAPSULE | Freq: Two times a day (BID) | ORAL | 0 refills | Status: DC | PRN

## 2016-09-17 NOTE — Progress Notes (Signed)
Subjective:    Patient ID: Rachel Riggs, female    DOB: Nov 23, 1960, 56 y.o.   MRN: [id number]  CC: Rachel Riggs is a 56 y.o. female who presents today for an acute visit.    HPI: CC:  Productive cough x 6 days, unchanged.  Improves with mucinex. Endorses wheezing. No SOB - hasn't had to use inhaler. Denies exertional chest pain or pressure, numbness or tingling radiating to left arm or jaw, palpitations, dizziness, frequent headaches, changes in vision.    H/o bronchitis.  Seasonal allergies      HISTORY:  Past Medical History:  Diagnosis Date  . Asthma   . Diabetes mellitus without complication (HCC)   . Hyperlipidemia   . Migraines    Past Surgical History:  Procedure Laterality Date  . HERNIA REPAIR  1991   Family History  Problem Relation Age of Onset  . Parkinsonism Mother 58  . Cancer Mother     uterus  . Heart failure Mother   . Hypertension Father   . Hyperlipidemia Father   . Diabetes Father   . Heart disease Father 41    CAD s/p 4 vessel CABG  . Cancer Brother 15    prostate CA  . Diabetes Maternal Grandmother   . Stroke Maternal Grandmother   . Heart disease Paternal Grandfather   . COPD Paternal Grandfather     Allergies: Amoxicillin; Aspirin; Ibuprofen; Penicillins; and Sulfa antibiotics Current Outpatient Prescriptions on File Prior to Visit  Medication Sig Dispense Refill  . albuterol (VENTOLIN HFA) 108 (90 Base) MCG/ACT inhaler Inhale 2 puffs into the lungs every 6 (six) hours as needed for wheezing. 1 Inhaler 2  . atorvastatin (LIPITOR) 20 MG tablet TAKE ONE TABLET BY MOUTH ONCE DAILY 90 tablet 3  . busPIRone (BUSPAR) 7.5 MG tablet Take 1 tablet (7.5 mg total) by mouth 3 (three) times daily. 90 tablet 1  . butalbital-acetaminophen-caffeine (FIORICET, ESGIC) 50-325-40 MG per tablet TAKE ONE TO TWO TABLETS BY MOUTH EVERY 6 HOURS AS NEEDED FOR HEADACHE 90 tablet 2  . cetirizine (ZYRTEC) 10 MG tablet Take 10 mg by mouth daily as needed for  allergies.    Marland Kitchen etodolac (LODINE) 500 MG tablet Take 1 tablet (500 mg total) by mouth 2 (two) times daily. 60 tablet 3  . meclizine (ANTIVERT) 25 MG tablet Take 1 tablet (25 mg total) by mouth 3 (three) times daily as needed. 30 tablet 0  . metFORMIN (GLUCOPHAGE) 500 MG tablet TAKE ONE TABLET BY MOUTH TWICE DAILY WITH MEALS 180 tablet 3  . MICROGESTIN 1-20 MG-MCG tablet TAKE ONE TABLET BY MOUTH ONCE DAILY *NEEDS  OFFICE  VISIT  FOR  FURTHER  REFILLS* 63 tablet 5  . montelukast (SINGULAIR) 10 MG tablet Take 1 tablet (10 mg total) by mouth at bedtime. 30 tablet 3  . phentermine (ADIPEX-P) 37.5 MG tablet TAKE ONE TABLET BY MOUTH ONCE DAILY BEFORE BREAKFAST 30 tablet 2   No current facility-administered medications on file prior to visit.     Social History  Substance Use Topics  . Smoking status: Former Smoker    Quit date: 08/04/1984  . Smokeless tobacco: Never Used  . Alcohol use 0.6 oz/week    1 Glasses of wine per week    Review of Systems  Constitutional: Negative for chills and fever.  HENT: Positive for congestion, rhinorrhea and voice change. Negative for ear pain, sinus pressure and sore throat.   Respiratory: Positive for cough and wheezing. Negative for shortness  of breath.   Cardiovascular: Negative for chest pain and palpitations.  Gastrointestinal: Negative for nausea and vomiting.      Objective:    BP 132/78   Pulse 97   Temp 98.7 F (37.1 C) (Oral)   Ht 5' 7.25" (1.708 m)   Wt 208 lb 9.6 oz (94.6 kg)   SpO2 99%   BMI 32.43 kg/m    Physical Exam  Constitutional: She appears well-developed and well-nourished.  HENT:  Head: Normocephalic and atraumatic.  Right Ear: Hearing, tympanic membrane, external ear and ear canal normal. No drainage, swelling or tenderness. No foreign bodies. Tympanic membrane is not erythematous and not bulging. No middle ear effusion. No decreased hearing is noted.  Left Ear: Hearing, tympanic membrane, external ear and ear canal normal.  No drainage, swelling or tenderness. No foreign bodies. Tympanic membrane is not erythematous and not bulging.  No middle ear effusion. No decreased hearing is noted.  Nose: Nose normal. No rhinorrhea. Right sinus exhibits no maxillary sinus tenderness and no frontal sinus tenderness. Left sinus exhibits no maxillary sinus tenderness and no frontal sinus tenderness.  Mouth/Throat: Uvula is midline, oropharynx is clear and moist and mucous membranes are normal. No oropharyngeal exudate, posterior oropharyngeal edema, posterior oropharyngeal erythema or tonsillar abscesses.  Eyes: Conjunctivae are normal.  Cardiovascular: Regular rhythm, normal heart sounds and normal pulses.   Pulmonary/Chest: Effort normal and breath sounds normal. She has no wheezes. She has no rhonchi. She has no rales.  Lymphadenopathy:       Head (right side): No submental, no submandibular, no tonsillar, no preauricular, no posterior auricular and no occipital adenopathy present.       Head (left side): No submental, no submandibular, no tonsillar, no preauricular, no posterior auricular and no occipital adenopathy present.    She has no cervical adenopathy.  Neurological: She is alert.  Skin: Skin is warm and dry.  Psychiatric: She has a normal mood and affect. Her speech is normal and behavior is normal. Thought content normal.  Vitals reviewed.      Assessment & Plan:   1. Bronchitis Reassured by improvement. Patient is afebrile and in no acute respiratory distress.  we suspect suspect viral etiology and patient will continue with Mucinex, albuterol. Tessalon Perles as needed. She will let me know in the next couple days if she is not better. Due to her antibiotic allergies, her  Preference is azithromycin, if needed.   - benzonatate (TESSALON) 100 MG capsule; Take 1 capsule (100 mg total) by mouth 2 (two) times daily as needed for cough.  Dispense: 20 capsule; Refill: 0    I have discontinued Ms. Gagan's  B-complex with vitamin C. I am also having her start on benzonatate. Additionally, I am having her maintain her cetirizine, meclizine, butalbital-acetaminophen-caffeine, busPIRone, etodolac, albuterol, metFORMIN, atorvastatin, MICROGESTIN, montelukast, and phentermine.   Meds ordered this encounter  Medications  . benzonatate (TESSALON) 100 MG capsule    Sig: Take 1 capsule (100 mg total) by mouth 2 (two) times daily as needed for cough.    Dispense:  20 capsule    Refill:  0    Order Specific Question:   Supervising Provider    Answer:   Sherlene Shams [[id number]]    Return precautions given.   Risks, benefits, and alternatives of the medications and treatment plan prescribed today were discussed, and patient expressed understanding.   Education regarding symptom management and diagnosis given to patient on AVS.  Continue to follow with [name],  Mar DaringERESA L, MD for routine health maintenance.   Rachel Bunkerheryl A Inda and I agreed with plan.   Rennie PlowmanMargaret Kezia Benevides, FNP

## 2016-09-17 NOTE — Patient Instructions (Signed)
As discussed, suspect  cough is viral in nature. Please continue the Mucinex and use albuterol as we discussed. Please let me know if not feeling better in the next couple days, and we will start azithromycin.

## 2016-09-17 NOTE — Progress Notes (Signed)
Pre visit review using our clinic review tool, if applicable. No additional management support is needed unless otherwise documented below in the visit note. 

## 2016-10-14 ENCOUNTER — Other Ambulatory Visit: Payer: Self-pay | Admitting: Internal Medicine

## 2016-10-16 ENCOUNTER — Other Ambulatory Visit: Payer: Self-pay | Admitting: Internal Medicine

## 2016-10-17 MED ORDER — MECLIZINE HCL 25 MG PO TABS: 25.0000 mg | ORAL_TABLET | Freq: Three times a day (TID) | ORAL | 0 refills | Status: DC | PRN

## 2016-11-19 ENCOUNTER — Encounter: Payer: Self-pay | Admitting: Internal Medicine

## 2016-11-30 ENCOUNTER — Other Ambulatory Visit: Payer: Self-pay | Admitting: Internal Medicine

## 2017-01-18 ENCOUNTER — Other Ambulatory Visit: Payer: Self-pay | Admitting: Internal Medicine

## 2017-02-02 ENCOUNTER — Encounter: Payer: Self-pay | Admitting: Internal Medicine

## 2017-02-02 ENCOUNTER — Ambulatory Visit (INDEPENDENT_AMBULATORY_CARE_PROVIDER_SITE_OTHER): Payer: BLUE CROSS/BLUE SHIELD | Admitting: Internal Medicine

## 2017-02-02 VITALS — BP 126/82 | HR 100 | Temp 98.2°F | Resp 16 | Ht 67.25 in | Wt 216.0 lb

## 2017-02-02 DIAGNOSIS — E6609 Other obesity due to excess calories: Secondary | ICD-10-CM

## 2017-02-02 DIAGNOSIS — F411 Generalized anxiety disorder: Secondary | ICD-10-CM | POA: Diagnosis not present

## 2017-02-02 DIAGNOSIS — E119 Type 2 diabetes mellitus without complications: Secondary | ICD-10-CM | POA: Diagnosis not present

## 2017-02-02 DIAGNOSIS — E7800 Pure hypercholesterolemia, unspecified: Secondary | ICD-10-CM

## 2017-02-02 DIAGNOSIS — Z6832 Body mass index (BMI) 32.0-32.9, adult: Secondary | ICD-10-CM | POA: Diagnosis not present

## 2017-02-02 LAB — COMPREHENSIVE METABOLIC PANEL
ALK PHOS: 105 U/L (ref 39–117)
ALT: 9 U/L (ref 0–35)
AST: 13 U/L (ref 0–37)
Albumin: 4 g/dL (ref 3.5–5.2)
BUN: 11 mg/dL (ref 6–23)
CALCIUM: 9.4 mg/dL (ref 8.4–10.5)
CO2: 23 mEq/L (ref 19–32)
CREATININE: 0.7 mg/dL (ref 0.40–1.20)
Chloride: 103 mEq/L (ref 96–112)
GFR: 91.87 mL/min (ref >60.00)
Glucose, Bld: 144 mg/dL — ABNORMAL HIGH (ref 70–99)
Potassium: 4.4 mEq/L (ref 3.5–5.1)
Sodium: 137 mEq/L (ref 135–145)
TOTAL PROTEIN: 7.3 g/dL (ref 6.0–8.3)
Total Bilirubin: 1 mg/dL (ref 0.2–1.2)

## 2017-02-02 LAB — HM DIABETES FOOT EXAM: HM Diabetic Foot Exam: NORMAL

## 2017-02-02 LAB — HEMOGLOBIN A1C: Hgb A1c MFr Bld: 6.5 % (ref 4.6–6.5)

## 2017-02-02 NOTE — Patient Instructions (Signed)
Do not worry about your memory!  You are MULTI TASKING TOO MUCH!  If you are still hungry on the Keto Diet, , we will start victoza

## 2017-02-02 NOTE — Progress Notes (Signed)
Subjective:  Patient ID: Rachel Riggs, female    DOB: 10/14/1960  Age: 56 y.o. MRN: [id number]  CC: The primary encounter diagnosis was Diabetes mellitus without complication. Diagnoses of Pure hypercholesterolemia, Class 1 obesity due to excess calories with serious comorbidity and body mass index (BMI) of 32.0 to 32.9 in adult, and Generalized anxiety disorder were also pertinent to this visit.  HPI Rachel BunkerCheryl A Braver presents for follow up on type 2  Dm complicated by obesity and hyperlipidemia .  Patient does not check blood sugars more than once a week,  Last one was 135 a week ago in a fasting state.  Does not recall any above 200 or less than 80.  No complaints today.  Taking her medications as directed.  Walking 2 miles daily . Planning to start the Keto diet.Marland Kitchen.  Has had her annual diabetic eye exam.  Denies numbness and tingling in lower extremities.  Denies hypoglycemic symptoms.   GAD:  Her anxiety has been  Controlled with  exercise alone.  She does not want therapy   Taking singulair and zyrtec, no recent hives breakout.   Outpatient Medications Prior to Visit  Medication Sig Dispense Refill  . albuterol (VENTOLIN HFA) 108 (90 Base) MCG/ACT inhaler Inhale 2 puffs into the lungs every 6 (six) hours as needed for wheezing. 1 Inhaler 2  . atorvastatin (LIPITOR) 20 MG tablet TAKE ONE TABLET BY MOUTH ONCE DAILY 90 tablet 3  . butalbital-acetaminophen-caffeine (FIORICET, ESGIC) 50-325-40 MG per tablet TAKE ONE TO TWO TABLETS BY MOUTH EVERY 6 HOURS AS NEEDED FOR HEADACHE 90 tablet 2  . etodolac (LODINE) 500 MG tablet Take 1 tablet (500 mg total) by mouth 2 (two) times daily. 60 tablet 3  . meclizine (ANTIVERT) 25 MG tablet Take 1 tablet (25 mg total) by mouth 3 (three) times daily as needed. 30 tablet 0  . metFORMIN (GLUCOPHAGE) 500 MG tablet TAKE ONE TABLET BY MOUTH TWICE DAILY WITH FOOD 180 tablet 3  . MICROGESTIN 1-20 MG-MCG tablet TAKE ONE TABLET BY MOUTH ONCE DAILY *NEEDS  OFFICE   VISIT  FOR  FURTHER  REFILLS* 63 tablet 5  . montelukast (SINGULAIR) 10 MG tablet TAKE ONE TABLET BY MOUTH AT BEDTIME 30 tablet 3  . busPIRone (BUSPAR) 7.5 MG tablet Take 1 tablet (7.5 mg total) by mouth 3 (three) times daily. 90 tablet 1  . benzonatate (TESSALON) 100 MG capsule Take 1 capsule (100 mg total) by mouth 2 (two) times daily as needed for cough. (Patient not taking: Reported on 02/02/2017) 20 capsule 0  . cetirizine (ZYRTEC) 10 MG tablet Take 10 mg by mouth daily as needed for allergies.    . phentermine (ADIPEX-P) 37.5 MG tablet TAKE ONE TABLET BY MOUTH ONCE DAILY BEFORE BREAKFAST (Patient not taking: Reported on 02/02/2017) 30 tablet 2   No facility-administered medications prior to visit.     Review of Systems;  Patient denies headache, fevers, malaise, unintentional weight loss, skin rash, eye pain, sinus congestion and sinus pain, sore throat, dysphagia,  hemoptysis , cough, dyspnea, wheezing, chest pain, palpitations, orthopnea, edema, abdominal pain, nausea, melena, diarrhea, constipation, flank pain, dysuria, hematuria, urinary  Frequency, nocturia, numbness, tingling, seizures,  Focal weakness, Loss of consciousness,  Tremor, insomnia, depression, anxiety, and suicidal ideation.      Objective:  BP 126/82 (BP Location: Left Arm, Patient Position: Sitting, Cuff Size: Normal)   Pulse 100   Temp 98.2 F (36.8 C) (Oral)   Resp 16   Ht 5'  7.25" (1.708 m)   Wt 216 lb (98 kg)   SpO2 98%   BMI 33.58 kg/m   BP Readings from Last 3 Encounters:  02/02/17 126/82  09/17/16 132/78  08/05/16 138/86    Wt Readings from Last 3 Encounters:  02/02/17 216 lb (98 kg)  09/17/16 208 lb 9.6 oz (94.6 kg)  08/05/16 208 lb 4 oz (94.5 kg)    General appearance: alert, cooperative and appears stated age Ears: normal TM's and external ear canals both ears Throat: lips, mucosa, and tongue normal; teeth and gums normal Neck: no adenopathy, no carotid bruit, supple, symmetrical, trachea  midline and thyroid not enlarged, symmetric, no tenderness/mass/nodules Back: symmetric, no curvature. ROM normal. No CVA tenderness. Lungs: clear to auscultation bilaterally Heart: regular rate and rhythm, S1, S2 normal, no murmur, click, rub or gallop Abdomen: soft, non-tender; bowel sounds normal; no masses,  no organomegaly Pulses: 2+ and symmetric Skin: Skin color, texture, turgor normal. No rashes or lesions Lymph nodes: Cervical, supraclavicular, and axillary nodes normal.  Lab Results  Component Value Date   HGBA1C 6.5 02/02/2017   HGBA1C 6.0 07/01/2016   HGBA1C 7.1 (H) 06/26/2015    Lab Results  Component Value Date   CREATININE 0.70 02/02/2017   CREATININE 0.63 07/01/2016   CREATININE 0.50 11/14/2015    Lab Results  Component Value Date   WBC 7.4 12/21/2013   HGB 11.7 (L) 12/21/2013   HCT 35.5 (L) 12/21/2013   PLT 351.0 12/21/2013   GLUCOSE 144 (H) 02/02/2017   CHOL 199 07/01/2016   TRIG 178.0 (H) 07/01/2016   HDL 67.30 07/01/2016   LDLDIRECT 107.0 07/01/2016   LDLCALC 97 07/01/2016   ALT 9 02/02/2017   AST 13 02/02/2017   NA 137 02/02/2017   K 4.4 02/02/2017   CL 103 02/02/2017   CREATININE 0.70 02/02/2017   BUN 11 02/02/2017   CO2 23 02/02/2017   TSH 3.35 10/14/2013   HGBA1C 6.5 02/02/2017   MICROALBUR 1.9 07/01/2016      Assessment & Plan:   Problem List Items Addressed This Visit    Hyperlipidemia    improved profile on  higher potency atorvastatin .  Lab Results  Component Value Date   CHOL 199 07/01/2016   HDL 67.30 07/01/2016   LDLCALC 97 07/01/2016   LDLDIRECT 107.0 07/01/2016   TRIG 178.0 (H) 07/01/2016   CHOLHDL 3 07/01/2016   Lab Results  Component Value Date   ALT 9 02/02/2017   AST 13 02/02/2017   ALKPHOS 105 02/02/2017   BILITOT 1.0 02/02/2017                Obesity    She has had limited success with phentermine, which has been stopped .  She is following the KETO diet. ,  will start victoza if weight  plateaus      Diabetes mellitus without complication - Primary     well-controlled on current medications.  hemoglobin A1c has been consistently at or  less than 7.0 . Patient is up-to-date on eye exams and foot exam is normal today. Patient has no microalbuminuria. . Patient is tolerating statin therapy for CAD risk reduction .  Lab Results  Component Value Date   HGBA1C 6.5 02/02/2017   Lab Results  Component Value Date   MICROALBUR 1.9 07/01/2016           Relevant Orders   Hemoglobin A1c (Completed)   Comprehensive metabolic panel (Completed)   Generalized anxiety disorder  Improved with time and exercise,  No medications currently.        A total of 25 minutes of face to face time was spent with patient more than half of which was spent in counselling about the above mentioned conditions  and coordination of care   I have discontinued Ms. Riviera's cetirizine, busPIRone, phentermine, and benzonatate. I am also having her maintain her butalbital-acetaminophen-caffeine, etodolac, albuterol, atorvastatin, MICROGESTIN, meclizine, montelukast, and metFORMIN.  No orders of the defined types were placed in this encounter.   Medications Discontinued During This Encounter  Medication Reason  . benzonatate (TESSALON) 100 MG capsule Patient has not taken in last 30 days  . cetirizine (ZYRTEC) 10 MG tablet Patient has not taken in last 30 days  . phentermine (ADIPEX-P) 37.5 MG tablet Patient has not taken in last 30 days  . busPIRone (BUSPAR) 7.5 MG tablet     Follow-up: Return in about 3 months (around 05/05/2017).   Sherlene Shams, MD

## 2017-02-02 NOTE — Assessment & Plan Note (Signed)
improved profile on  higher potency atorvastatin .  Lab Results  Component Value Date   CHOL 199 07/01/2016   HDL 67.30 07/01/2016   LDLCALC 97 07/01/2016   LDLDIRECT 107.0 07/01/2016   TRIG 178.0 (H) 07/01/2016   CHOLHDL 3 07/01/2016   Lab Results  Component Value Date   ALT 9 02/02/2017   AST 13 02/02/2017   ALKPHOS 105 02/02/2017   BILITOT 1.0 02/02/2017

## 2017-02-02 NOTE — Assessment & Plan Note (Addendum)
well-controlled on current medications.  hemoglobin A1c has been consistently at or  less than 7.0 . Patient is up-to-date on eye exams and foot exam is normal today. Patient has no microalbuminuria. . Patient is tolerating statin therapy for CAD risk reduction .  Lab Results  Component Value Date   HGBA1C 6.5 02/02/2017   Lab Results  Component Value Date   MICROALBUR 1.9 07/01/2016

## 2017-02-02 NOTE — Assessment & Plan Note (Addendum)
She has had limited success with phentermine, which has been stopped .  She is following the KETO diet. ,  will start victoza if weight plateaus

## 2017-02-02 NOTE — Assessment & Plan Note (Signed)
Improved with time and exercise,  No medications currently.

## 2017-02-03 ENCOUNTER — Encounter: Payer: Self-pay | Admitting: Internal Medicine

## 2017-03-10 ENCOUNTER — Encounter: Payer: Self-pay | Admitting: Internal Medicine

## 2017-03-22 ENCOUNTER — Other Ambulatory Visit: Payer: Self-pay | Admitting: Internal Medicine

## 2017-04-03 ENCOUNTER — Encounter: Payer: Self-pay | Admitting: Internal Medicine

## 2017-05-07 ENCOUNTER — Encounter: Payer: Self-pay | Admitting: Internal Medicine

## 2017-05-07 DIAGNOSIS — Z23 Encounter for immunization: Secondary | ICD-10-CM | POA: Diagnosis not present

## 2017-06-21 ENCOUNTER — Other Ambulatory Visit: Payer: Self-pay | Admitting: Internal Medicine

## 2017-06-26 ENCOUNTER — Other Ambulatory Visit: Payer: Self-pay | Admitting: Internal Medicine

## 2017-06-30 NOTE — Telephone Encounter (Signed)
Okay to refill Etodolac? Last filled on 10/17/15 #60 with 3 refills. Last OV: 02/02/17 & no future appt scheduled.

## 2017-07-02 NOTE — Telephone Encounter (Signed)
Patient has history of hives to medications  In this category.  Need to check with patient to see when the last time she took it.  Can refill if she has taken it recently with no side effects

## 2017-08-22 DIAGNOSIS — E119 Type 2 diabetes mellitus without complications: Secondary | ICD-10-CM | POA: Diagnosis not present

## 2017-08-22 LAB — HM DIABETES EYE EXAM

## 2017-09-20 ENCOUNTER — Other Ambulatory Visit: Payer: Self-pay | Admitting: Internal Medicine

## 2017-10-04 ENCOUNTER — Encounter: Payer: Self-pay | Admitting: Internal Medicine

## 2017-10-05 ENCOUNTER — Encounter: Payer: Self-pay | Admitting: Internal Medicine

## 2017-11-14 ENCOUNTER — Other Ambulatory Visit: Payer: Self-pay | Admitting: Internal Medicine

## 2017-12-09 ENCOUNTER — Encounter: Payer: Self-pay | Admitting: Internal Medicine

## 2017-12-09 NOTE — Telephone Encounter (Signed)
Do not see the epi pen in neither the current med list or the historical med list.

## 2017-12-10 ENCOUNTER — Telehealth: Payer: Self-pay

## 2017-12-10 MED ORDER — EPINEPHRINE 0.15 MG/0.3ML IJ SOAJ: 0.1500 mg | INTRAMUSCULAR | 1 refills | Status: DC | PRN

## 2017-12-10 MED ORDER — EPINEPHRINE 0.3 MG/0.3ML IJ SOAJ: 0.3000 mg | Freq: Once | INTRAMUSCULAR | 1 refills | Status: AC

## 2017-12-10 NOTE — Telephone Encounter (Signed)
Patient requested refill on her epi pen because her last one expired in 2017,   but the previous refill was not in chart .  If they have a record of the previous  One , and it is different dose or size, please let me know .  Otherwise please ask them to fill it

## 2017-12-10 NOTE — Telephone Encounter (Signed)
Pharmacy [facility] ) called and stated that they received a prescription for an epipen Jr. For this patient they wanted to make sure that is what you were wanting to send in. Thank you

## 2017-12-10 NOTE — Addendum Note (Signed)
Addended by: Sherlene Shams on: 12/10/2017 12:56 PM   Modules accepted: Orders

## 2017-12-10 NOTE — Telephone Encounter (Signed)
Thank you,  corrected and sent

## 2017-12-10 NOTE — Telephone Encounter (Signed)
Pharmacy had no record of previous script for an epi-pen. But the patient states that she had the epi-pen 0.57m auto-pen injector.

## 2018-02-15 ENCOUNTER — Other Ambulatory Visit: Payer: Self-pay | Admitting: Internal Medicine

## 2018-02-15 ENCOUNTER — Encounter: Payer: Self-pay | Admitting: Internal Medicine

## 2018-02-15 DIAGNOSIS — E119 Type 2 diabetes mellitus without complications: Secondary | ICD-10-CM

## 2018-02-15 MED ORDER — BUTALBITAL-APAP-CAFFEINE 50-325-40 MG PO TABS: ORAL_TABLET | ORAL | 0 refills | Status: DC

## 2018-02-16 MED ORDER — ETODOLAC 500 MG PO TABS: 500.0000 mg | ORAL_TABLET | Freq: Two times a day (BID) | ORAL | 0 refills | Status: DC

## 2018-02-16 NOTE — Telephone Encounter (Signed)
Refilled: 07/02/2017 Last OV: 02/02/2017 Next OV: not scheduled

## 2018-02-16 NOTE — Telephone Encounter (Signed)
30 DAYS ONLY.  NEEDS FASTING LABS  PRIOR TO OFFICE VISIT . ORDERED . PLESAE SCHEUDLE

## 2018-02-17 ENCOUNTER — Other Ambulatory Visit: Payer: Self-pay | Admitting: Internal Medicine

## 2018-02-18 NOTE — Telephone Encounter (Signed)
Spoke with pt and informed her that the medication was only refilled for 30 days due to her being due for lab work. Scheduled pt for a physical and for a lab appt. Pt is aware of appt date and time.

## 2018-02-26 ENCOUNTER — Encounter: Payer: Self-pay | Admitting: Internal Medicine

## 2018-02-26 DIAGNOSIS — Z1239 Encounter for other screening for malignant neoplasm of breast: Secondary | ICD-10-CM

## 2018-03-01 ENCOUNTER — Encounter: Payer: Self-pay | Admitting: Internal Medicine

## 2018-03-03 ENCOUNTER — Encounter: Payer: Self-pay | Admitting: Internal Medicine

## 2018-03-04 ENCOUNTER — Telehealth: Payer: Self-pay

## 2018-03-04 NOTE — Telephone Encounter (Signed)
Pt has been scheduled with Leotis ShamesLauren, NP tomorrow at 10:00am. Pt is aware of appt date and time.

## 2018-03-04 NOTE — Telephone Encounter (Signed)
Copied from CRM [id number]. Topic: Appointment Scheduling - Scheduling Inquiry for Clinic >> Mar 04, 2018 12:53 PM Alexander BergeronBarksdale, Harvey B wrote: Reason for CRM: pt called to get in w/ Dr. Darrick Huntsmanullo about her foot; [name] sent pt a mychart message to schedule an appt but no slots available, contact pt to advise

## 2018-03-05 ENCOUNTER — Ambulatory Visit (INDEPENDENT_AMBULATORY_CARE_PROVIDER_SITE_OTHER): Payer: BLUE CROSS/BLUE SHIELD

## 2018-03-05 ENCOUNTER — Encounter: Payer: Self-pay | Admitting: Internal Medicine

## 2018-03-05 ENCOUNTER — Ambulatory Visit: Payer: BLUE CROSS/BLUE SHIELD | Admitting: Family Medicine

## 2018-03-05 ENCOUNTER — Encounter: Payer: Self-pay | Admitting: Family Medicine

## 2018-03-05 VITALS — BP 140/80 | HR 92 | Temp 98.3°F | Resp 18 | Ht 67.25 in | Wt 216.0 lb

## 2018-03-05 DIAGNOSIS — M79672 Pain in left foot: Secondary | ICD-10-CM

## 2018-03-05 DIAGNOSIS — M19072 Primary osteoarthritis, left ankle and foot: Secondary | ICD-10-CM | POA: Diagnosis not present

## 2018-03-05 DIAGNOSIS — S90822A Blister (nonthermal), left foot, initial encounter: Secondary | ICD-10-CM | POA: Diagnosis not present

## 2018-03-05 NOTE — Patient Instructions (Signed)
Great to meet you!  Please get xray of left foot  Wear good, sturdy, supportive shoes to help foot pain. May slowly start going back to gym and do exercises as tolerated.   We can also consider referral to podiatry if needed.

## 2018-03-05 NOTE — Progress Notes (Signed)
   Subjective:    Patient ID: Rachel Riggs, female    DOB: 10/29/1960, 57 y.o.   MRN: [id number]  HPI Patient presents to clinic c/o left foot pain for past 2 weeks. Denies any fall or injury, denies stepping on oblject. She has started going back to gym recently - doing strength training and a trampoline classes. When her heel began to hurt she tried different types of shoes and also put gel inserts into shoes with minimal relief. She also began to tape heel with athletic tape, but this only seemed to irritate her skin. She has skechers shoes with memory foam on today and this has given some relief. Also has been taking her already prescribed etodolac BID to help with pain, and this has given some relief.     Review of Systems  Constitutional: Negative for chills, fatigue and fever.  HENT: Negative.   Respiratory: Negative for cough and shortness of breath.   Cardiovascular: Negative for chest pain, palpitations and leg swelling.  Gastrointestinal: Negative.   Genitourinary: Negative.   Musculoskeletal: Positive for arthralgias and gait problem.       Left heel pain - at times when left heel is hurting more, she will limp.  Skin: Negative for pallor and rash.       Small blister area on left ankle - suspected to be skin irritation from application of athletic tape.   Neurological: Negative for weakness, light-headedness and numbness.     Objective:   Physical Exam  Constitutional: She is oriented to person, place, and time. She appears well-developed and well-nourished. No distress.  HENT:  Head: Normocephalic and atraumatic.  Musculoskeletal: Normal range of motion. She exhibits no edema or deformity.       Left foot: There is tenderness. There is normal range of motion, no swelling, normal capillary refill, no crepitus, no deformity and no laceration.       Feet:  Neurological: She is alert and oriented to person, place, and time.  Gait normal  Skin: Skin is warm and dry.  Capillary refill takes less than 2 seconds.     Has toenail fungus in toes of both feet - patient keeps nails thinned down on her own by filing the nails.   Psychiatric: She has a normal mood and affect. Her behavior is normal.  Nursing note and vitals reviewed.  Vitals:   03/05/18 0952  BP: 140/80  Pulse: 92  Resp: 18  Temp: 98.3 F (36.8 C)  SpO2: 98%         Assessment & Plan:  Left Heel pain -- XRAY of left foot. Wear sturdy shoes & can use gel/foam inserts to help cushion heel. May continue etodolac for pain. Can slowly start exercising again, advance exercise program as tolerated. Recommended she doing stretching exercises 1-2 times a day and especially to remember to stretch prior to working out. Can also consider podiatry referral in future if needed.   Blister, left foot -- Appears to be resolving on own. Avoid picking at blistered skin; open skin areas increase risk of infection.

## 2018-03-05 NOTE — Progress Notes (Signed)
Pre-visit discussion using our clinic review tool. No additional management support is needed unless otherwise documented below in the visit note.  

## 2018-03-24 ENCOUNTER — Ambulatory Visit
Admission: RE | Admit: 2018-03-24 | Discharge: 2018-03-24 | Disposition: A | Discharge status: Home / Self Care | Payer: BLUE CROSS/BLUE SHIELD | Source: Ambulatory Visit | Attending: Internal Medicine | Admitting: Internal Medicine

## 2018-03-24 DIAGNOSIS — Z1231 Encounter for screening mammogram for malignant neoplasm of breast: Secondary | ICD-10-CM | POA: Insufficient documentation

## 2018-03-24 DIAGNOSIS — Z1239 Encounter for other screening for malignant neoplasm of breast: Secondary | ICD-10-CM

## 2018-04-02 ENCOUNTER — Other Ambulatory Visit (INDEPENDENT_AMBULATORY_CARE_PROVIDER_SITE_OTHER): Payer: BLUE CROSS/BLUE SHIELD

## 2018-04-02 DIAGNOSIS — E119 Type 2 diabetes mellitus without complications: Secondary | ICD-10-CM

## 2018-04-02 LAB — COMPREHENSIVE METABOLIC PANEL
ALBUMIN: 4.1 g/dL (ref 3.5–5.2)
ALK PHOS: 104 U/L (ref 39–117)
ALT: 10 U/L (ref 0–35)
AST: 13 U/L (ref 0–37)
BUN: 12 mg/dL (ref 6–23)
CHLORIDE: 102 meq/L (ref 96–112)
CO2: 23 mEq/L (ref 19–32)
CREATININE: 0.7 mg/dL (ref 0.40–1.20)
Calcium: 9.5 mg/dL (ref 8.4–10.5)
GFR: 91.49 mL/min (ref >60.00)
GLUCOSE: 161 mg/dL — AB (ref 70–99)
POTASSIUM: 4.4 meq/L (ref 3.5–5.1)
Sodium: 136 mEq/L (ref 135–145)
TOTAL PROTEIN: 7.2 g/dL (ref 6.0–8.3)
Total Bilirubin: 0.8 mg/dL (ref 0.2–1.2)

## 2018-04-02 LAB — URINALYSIS, ROUTINE W REFLEX MICROSCOPIC
Hgb urine dipstick: NEGATIVE
KETONES UR: NEGATIVE
NITRITE: NEGATIVE
URINE GLUCOSE: NEGATIVE
UROBILINOGEN UA: 0.2 (ref 0.0–1.0)
pH: 5.5 (ref 5.0–8.0)

## 2018-04-02 LAB — LIPID PANEL
CHOLESTEROL: 224 mg/dL — AB (ref 0–200)
HDL: 71.9 mg/dL (ref >39.00)
LDL Cholesterol: 112 mg/dL — ABNORMAL HIGH (ref 0–99)
NONHDL: 151.69
Total CHOL/HDL Ratio: 3
Triglycerides: 196 mg/dL — ABNORMAL HIGH (ref 0.0–149.0)
VLDL: 39.2 mg/dL (ref 0.0–40.0)

## 2018-04-02 LAB — HEMOGLOBIN A1C: HEMOGLOBIN A1C: 6.8 % — AB (ref 4.6–6.5)

## 2018-04-08 ENCOUNTER — Encounter: Payer: Self-pay | Admitting: Internal Medicine

## 2018-04-08 ENCOUNTER — Ambulatory Visit (INDEPENDENT_AMBULATORY_CARE_PROVIDER_SITE_OTHER): Payer: BLUE CROSS/BLUE SHIELD | Admitting: Internal Medicine

## 2018-04-08 ENCOUNTER — Other Ambulatory Visit (HOSPITAL_COMMUNITY)
Admission: RE | Admit: 2018-04-08 | Discharge: 2018-04-08 | Disposition: A | Discharge status: Home / Self Care | Payer: BLUE CROSS/BLUE SHIELD | Source: Ambulatory Visit | Attending: Internal Medicine | Admitting: Internal Medicine

## 2018-04-08 VITALS — BP 132/88 | HR 88 | Temp 98.7°F | Resp 15 | Ht 67.25 in | Wt 234.6 lb

## 2018-04-08 DIAGNOSIS — Z124 Encounter for screening for malignant neoplasm of cervix: Secondary | ICD-10-CM

## 2018-04-08 DIAGNOSIS — Z6832 Body mass index (BMI) 32.0-32.9, adult: Secondary | ICD-10-CM | POA: Diagnosis not present

## 2018-04-08 DIAGNOSIS — Z0000 Encounter for general adult medical examination without abnormal findings: Secondary | ICD-10-CM

## 2018-04-08 DIAGNOSIS — Z1151 Encounter for screening for human papillomavirus (HPV): Secondary | ICD-10-CM | POA: Insufficient documentation

## 2018-04-08 DIAGNOSIS — R748 Abnormal levels of other serum enzymes: Secondary | ICD-10-CM | POA: Diagnosis not present

## 2018-04-08 DIAGNOSIS — E6609 Other obesity due to excess calories: Secondary | ICD-10-CM | POA: Diagnosis not present

## 2018-04-08 DIAGNOSIS — E7800 Pure hypercholesterolemia, unspecified: Secondary | ICD-10-CM

## 2018-04-08 DIAGNOSIS — R350 Frequency of micturition: Secondary | ICD-10-CM | POA: Diagnosis not present

## 2018-04-08 DIAGNOSIS — E119 Type 2 diabetes mellitus without complications: Secondary | ICD-10-CM

## 2018-04-08 DIAGNOSIS — R822 Biliuria: Secondary | ICD-10-CM

## 2018-04-08 LAB — URINALYSIS, ROUTINE W REFLEX MICROSCOPIC
Hgb urine dipstick: NEGATIVE
Ketones, ur: NEGATIVE
Leukocytes, UA: NEGATIVE
Nitrite: NEGATIVE
RBC / HPF: NONE SEEN (ref 0–?)
SPECIFIC GRAVITY, URINE: 1.02 (ref 1.000–1.030)
Total Protein, Urine: NEGATIVE
UROBILINOGEN UA: 0.2 (ref 0.0–1.0)
Urine Glucose: NEGATIVE
pH: 6 (ref 5.0–8.0)

## 2018-04-08 MED ORDER — PHENTERMINE HCL 37.5 MG PO TABS: 37.5000 mg | ORAL_TABLET | Freq: Every day | ORAL | 2 refills | Status: DC

## 2018-04-08 MED ORDER — TRIAMCINOLONE ACETONIDE 0.1 % EX CREA: 1.0000 "application " | TOPICAL_CREAM | Freq: Two times a day (BID) | CUTANEOUS | 0 refills | Status: DC

## 2018-04-08 NOTE — Patient Instructions (Signed)
Your cholesterol is FINE.    I have authorized the use of phentermine for 3 months.  Please have your vital signs checked a week after starting, and return to see me in 3 months  Take 1/2 tablet in the morning,  The second half by 2 PM to avoid insomnia.  Or take the whole tablet in the morning,  Goal weight loss is  5% of your starting weight by the end of the  3 months  (12 lbs)   There are low carb "on the go" breakfast drinks and bars:  1) try the premixed protein drinks (Atkins, AdvantEdge and the best tasting , highest protein one available is called " Premier Protein"; it is advocated by the bariatric surgeons for their patients and  available of < $2 serving at [facility] and  In bulk for $1.50/serving at [employer] and [employer]  .    Nutritional analysis :  160 cal  30 g protein  1 g sugar 50% calcium needs   Nicolette Bang and [employer]   2) There are plenty of high protein  HIGH  carb cookies,  But only the following are low carb."  Look for them in the diet section of most grocery stores or vitamin shops,  where the protein shakes are  sold.   All of these have 5 g sugar varieties if you read the label  : Power crunch Atkins bars KIND : make sure you find the  "low glycemic index"  variety QUEST : (taste better after being microwaved for 5 sec OUT OF THE WRAPPER)   Vilma Meckel now makes 2  frozen breakfast items that are low carb and microwaveable in 2 minutes or less:  The  Frittata    The  "Egg'which"  . Eula Flax are similar to quiches without the crust)  Gerre Pebbles also makes a microwaveable omelet called "Just Crack an Egg"  Which you can find in the egg section      To make a low carb chip :  Take the Joseph's Lavash or Pita bread,  Or the Mission Low carb whole wheat tortilla   Place on metal cookie sheet  Brush with olive oil  Sprinkle garlic powder (NOT garlic salt), grated parmesan cheese, mediterranean seasoning , or all of them?  Bake at 275 for 30 minutes   We  have substitutions for your potatoes!!  Try the mashed cauliflower and riced cauliflower dishes instead of rice and mashed potatoes  Mashed turnips are also very low carb!   For desserts :  Try the Dannon Lt n Fit greek yogurt dessert flavors and top with reddi Whip .  8 carbs,  80 calories  Try Oikos Triple Zero [place] Yogurt in the salted caramel, and the coffee flavors  With Whipped Cream for dessert  breyer's low carb ice cream, available in bars (on a stick, better ) or scoopable ice cream  HERE ARE THE LOW CARB  BREAD CHOICES   New one (not pictured) : SOLA  3 G /60 CAL PER SLICE  HARRIS TEETER FROZEN BREAD SECTION

## 2018-04-08 NOTE — Progress Notes (Signed)
Patient ID: Rachel Riggs, female    DOB: 07/31/1961  Age: 57 y.o. MRN: [id number]  The patient is here for annual preventive examination and management of other chronic and acute problems.   The risk factors are reflected in the social history.  The roster of all physicians providing medical care to patient - is listed in the Snapshot section of the chart.  Activities of daily living:  The patient is 100% independent in all ADLs: dressing, toileting, feeding as well as independent mobility  Home safety : The patient has smoke detectors in the home. They wear seatbelts.  There are no firearms at home. There is no violence in the home.   There is no risks for hepatitis, STDs or HIV. There is no   history of blood transfusion. They have no travel history to infectious disease endemic areas of the world.  The patient has seen their dentist in the last six month. They have seen their eye doctor in the last year. They admit to slight hearing difficulty with regard to whispered voices and some television programs.  They have deferred audiologic testing in the last year.  They do not  have excessive sun exposure. Discussed the need for sun protection: hats, long sleeves and use of sunscreen if there is significant sun exposure.   Diet: the importance of a healthy diet is discussed. They do have a healthy diet.  The benefits of regular aerobic exercise were discussed. She walks 4 times per week ,  20 minutes.   Depression screen: there are no signs or vegative symptoms of depression- irritability, change in appetite, anhedonia, sadness/tearfullness.  The following portions of the patient's history were reviewed and updated as appropriate: allergies, current medications, past family history, past medical history,  past surgical history, past social history  and problem list.  Visual acuity was not assessed per patient preference since she has regular follow up with her ophthalmologist. Hearing and  body mass index were assessed and reviewed.   During the course of the visit the patient was educated and counseled about appropriate screening and preventive services including : fall prevention , diabetes screening, nutrition counseling, colorectal cancer screening, and recommended immunizations.    CC: The primary encounter diagnosis was Encounter for preventive health examination. Diagnoses of Frequency of micturition, Screening for cervical cancer, Elevated liver enzymes, Bilirubin in urine, Diabetes mellitus without complication, Pure hypercholesterolemia, and Class 1 obesity due to excess calories without serious comorbidity with body mass index (BMI) of 32.0 to 32.9 in adult were also pertinent to this visit.  Weight gain  .  Following the Keto  diet,  Exercising . Frustrated at lck of weight loss.  Reviewed current strategies and diet.  Reviewed rise in LDL due to diet.    bilirubin in urine.   Liver enzymes normal but elevated mildly in 2016 and autoimmune panel was normal.  Type 2 DM:  Lost to follow up for one year due to patient preference.     History Rachel Riggs has a past medical history of Asthma, Diabetes mellitus without complication (HCC), Hyperlipidemia, and Migraines.   She has a past surgical history that includes Hernia repair (1991).   Her family history includes COPD in her paternal grandfather; Cancer in her mother; Cancer (age of onset: 75) in her brother; Diabetes in her father and maternal grandmother; Heart disease in her paternal grandfather; Heart disease (age of onset: 16) in her father; Heart failure in her mother; Hyperlipidemia in her father; Hypertension  in her father; Parkinsonism (age of onset: 38) in her mother; Stroke in her maternal grandmother.She reports that she quit smoking about 33 years ago. She has never used smokeless tobacco. She reports that she drinks about 1.0 standard drinks of alcohol per week. She reports that she does not use  drugs.  Outpatient Medications Prior to Visit  Medication Sig Dispense Refill  . albuterol (VENTOLIN HFA) 108 (90 Base) MCG/ACT inhaler Inhale 2 puffs into the lungs every 6 (six) hours as needed for wheezing. 1 Inhaler 2  . atorvastatin (LIPITOR) 20 MG tablet TAKE 1 TABLET BY MOUTH ONCE DAILY 90 tablet 0  . butalbital-acetaminophen-caffeine (FIORICET, ESGIC) 50-325-40 MG tablet TAKE ONE TO TWO TABLETS BY MOUTH EVERY 6 HOURS AS NEEDED FOR HEADACHE 60 tablet 0  . etodolac (LODINE) 500 MG tablet Take 1 tablet (500 mg total) by mouth 2 (two) times daily. 60 tablet 0  . levocetirizine (XYZAL ALLERGY 24HR) 5 MG tablet     . meclizine (ANTIVERT) 25 MG tablet TAKE 1 TABLET BY MOUTH THREE TIMES DAILY AS NEEDED 90 tablet 0  . metFORMIN (GLUCOPHAGE) 500 MG tablet TAKE ONE TABLET BY MOUTH TWICE DAILY WITH FOOD 180 tablet 3  . MICROGESTIN 1-20 MG-MCG tablet TAKE ONE TABLET BY MOUTH ONCE DAILY *NEEDS  OFFICE  VISIT  FOR  FURTHER  REFILLS* 63 tablet 5  . Potassium 75 MG TABS Take 1 tablet by mouth daily.    . montelukast (SINGULAIR) 10 MG tablet TAKE 1 TABLET BY MOUTH AT BEDTIME 30 tablet 3   No facility-administered medications prior to visit.     Review of Systems   Patient denies headache, fevers, malaise, unintentional weight loss, skin rash, eye pain, sinus congestion and sinus pain, sore throat, dysphagia,  hemoptysis , cough, dyspnea, wheezing, chest pain, palpitations, orthopnea, edema, abdominal pain, nausea, melena, diarrhea, constipation, flank pain, dysuria, hematuria, urinary  Frequency, nocturia, numbness, tingling, seizures,  Focal weakness, Loss of consciousness,  Tremor, insomnia, depression, anxiety, and suicidal ideation.      Objective:  BP 132/88 (BP Location: Left Arm, Patient Position: Sitting, Cuff Size: Large)   Pulse 88   Temp 98.7 F (37.1 C) (Oral)   Resp 15   Ht 5' 7.25" (1.708 m)   Wt 234 lb 9.6 oz (106.4 kg)   SpO2 98%   BMI 36.47 kg/m   Physical Exam   General  Appearance:    Alert, cooperative, no distress, appears stated age  Head:    Normocephalic, without obvious abnormality, atraumatic  Eyes:    PERRL, conjunctiva/corneas clear, EOM's intact, fundi    benign, both eyes  Ears:    Normal TM's and external ear canals, both ears  Nose:   Nares normal, septum midline, mucosa normal, no drainage    or sinus tenderness  Throat:   Lips, mucosa, and tongue normal; teeth and gums normal  Neck:   Supple, symmetrical, trachea midline, no adenopathy;    thyroid:  no enlargement/tenderness/nodules; no carotid   bruit or JVD  Back:     Symmetric, no curvature, ROM normal, no CVA tenderness  Lungs:     Clear to auscultation bilaterally, respirations unlabored  Chest Wall:    No tenderness or deformity   Heart:    Regular rate and rhythm, S1 and S2 normal, no murmur, rub   or gallop  Breast Exam:    No tenderness, masses, or nipple abnormality  Abdomen:     Soft, non-tender, bowel sounds active all four quadrants,  no masses, no organomegaly  Genitalia:    Pelvic: cervix normal in appearance, external genitalia normal, no adnexal masses or tenderness, no cervical motion tenderness, rectovaginal septum normal, uterus normal size, shape, and consistency and vagina normal without discharge  Extremities:   Extremities normal, atraumatic, no cyanosis or edema  Pulses:   2+ and symmetric all extremities  Skin:   Skin color, texture, turgor normal, no rashes or lesions  Lymph nodes:   Cervical, supraclavicular, and axillary nodes normal  Neurologic:   CNII-XII intact, normal strength, sensation and reflexes    throughout      Assessment & Plan:   Problem List Items Addressed This Visit    Diabetes mellitus without complication     well-controlled on diet alone .  hemoglobin A1c is  less than 7.0 . Patient is up-to-date on eye exams and foot exam is normal today. Patient has no microalbuminuria. . Patient is tolerating statin therapy for CAD risk reduction  .  Lab Results  Component Value Date   HGBA1C 6.8 (H) 04/02/2018   Lab Results  Component Value Date   MICROALBUR 1.9 07/01/2016           Elevated liver enzymes    Normal autoimmune panel in 2016.  No ultrasound done, but fatty liver suspected.  Has bilirubin in urine;  Will l recommend an ultrasound   Lab Results  Component Value Date   ALT 10 04/02/2018   AST 13 04/02/2018   ALKPHOS 104 04/02/2018   BILITOT 0.8 04/02/2018         Relevant Orders   US Abdomen Limited RUQ   Encounter for preventive health examination - Primary    Annual comprehensive preventive exam was done as well as an evaluation and management of chronic conditions .  During the course of the visit the patient was educated and counseled about appropriate screening and preventive services including :  diabetes screening, lipid analysis with projected  10 year  risk for CAD , nutrition counseling, breast, cervical and colorectal cancer screening, and recommended immunizations.  Printed recommendations for health maintenance screenings was given      Hyperlipidemia    Tolerating atorvastatin .  LDL elevated due to KETO diet which she is stopping.  Repeat in 6 months  Lab Results  Component Value Date   CHOL 224 (H) 04/02/2018   HDL 71.90 04/02/2018   LDLCALC 112 (H) 04/02/2018   LDLDIRECT 107.0 07/01/2016   TRIG 196.0 (H) 04/02/2018   CHOLHDL 3 04/02/2018   Lab Results  Component Value Date   ALT 10 04/02/2018   AST 13 04/02/2018   ALKPHOS 104 04/02/2018   BILITOT 0.8 04/02/2018                Obesity    Reviewed her previous success at weight loss,  Her goal weight for BMI < 30. I have addressed  BMI and recommended wt loss of 10% of body weigh over the next 6 months using a low glycemic index diet and regular exercise a minimum of 5 days per week. She has had difficulty losing weight due to increased appetite and is requesting a trial of  Phentermine.  She is aware of the possible side  effects and risks and understands that    The medication will be discontinued if she has not lost 5% of her body weight over the next 3 months, which , based on today's weight is 14 lbs.      Relevant Medications  phentermine (ADIPEX-P) 37.5 MG tablet   Screening for cervical cancer   Relevant Orders   Cytology - PAP    Other Visit Diagnoses    Frequency of micturition       Relevant Orders   Urinalysis, Routine w reflex microscopic (Completed)   Bilirubin in urine       Relevant Orders   US Abdomen Limited RUQ      I have discontinued Tahtiana A. Hosier's montelukast. I am also having her start on triamcinolone cream and phentermine. Additionally, I am having her maintain her albuterol, metFORMIN, MICROGESTIN, meclizine, etodolac, butalbital-acetaminophen-caffeine, atorvastatin, levocetirizine, and Potassium.  Meds ordered this encounter  Medications  . triamcinolone cream (KENALOG) 0.1 %    Sig: Apply 1 application topically 2 (two) times daily.    Dispense:  30 g    Refill:  0  . phentermine (ADIPEX-P) 37.5 MG tablet    Sig: Take 1 tablet (37.5 mg total) by mouth daily before breakfast.    Dispense:  30 tablet    Refill:  2    Medications Discontinued During This Encounter  Medication Reason  . montelukast (SINGULAIR) 10 MG tablet     Follow-up: Return in about 3 months (around 07/08/2018) for follow up diabetes, WEIGHT MANAGEMENT .   Sherlene Shams, MD

## 2018-04-09 NOTE — Assessment & Plan Note (Signed)
Tolerating atorvastatin .  LDL elevated due to KETO diet which she is stopping.  Repeat in 6 months  Lab Results  Component Value Date   CHOL 224 (H) 04/02/2018   HDL 71.90 04/02/2018   LDLCALC 112 (H) 04/02/2018   LDLDIRECT 107.0 07/01/2016   TRIG 196.0 (H) 04/02/2018   CHOLHDL 3 04/02/2018   Lab Results  Component Value Date   ALT 10 04/02/2018   AST 13 04/02/2018   ALKPHOS 104 04/02/2018   BILITOT 0.8 04/02/2018

## 2018-04-09 NOTE — Assessment & Plan Note (Signed)
well-controlled on diet alone .  hemoglobin A1c is  less than 7.0 . Patient is up-to-date on eye exams and foot exam is normal today. Patient has no microalbuminuria. . Patient is tolerating statin therapy for CAD risk reduction .  Lab Results  Component Value Date   HGBA1C 6.8 (H) 04/02/2018   Lab Results  Component Value Date   MICROALBUR 1.9 07/01/2016

## 2018-04-09 NOTE — Assessment & Plan Note (Signed)
Normal autoimmune panel in 2016.  No ultrasound done, but fatty liver suspected.  Has bilirubin in urine;  Will l recommend an ultrasound   Lab Results  Component Value Date   ALT 10 04/02/2018   AST 13 04/02/2018   ALKPHOS 104 04/02/2018   BILITOT 0.8 04/02/2018

## 2018-04-09 NOTE — Assessment & Plan Note (Signed)
Reviewed her previous success at weight loss,  Her goal weight for BMI < 30. I have addressed  BMI and recommended wt loss of 10% of body weigh over the next 6 months using a low glycemic index diet and regular exercise a minimum of 5 days per week. She has had difficulty losing weight due to increased appetite and is requesting a trial of  Phentermine.  She is aware of the possible side effects and risks and understands that    The medication will be discontinued if she has not lost 5% of her body weight over the next 3 months, which , based on today's weight is 14 lbs.

## 2018-04-09 NOTE — Assessment & Plan Note (Signed)
Annual comprehensive preventive exam was done as well as an evaluation and management of chronic conditions .  During the course of the visit the patient was educated and counseled about appropriate screening and preventive services including :  diabetes screening, lipid analysis with projected  10 year  risk for CAD , nutrition counseling, breast, cervical and colorectal cancer screening, and recommended immunizations.  Printed recommendations for health maintenance screenings was given 

## 2018-04-12 LAB — CYTOLOGY - PAP
DIAGNOSIS: NEGATIVE
HPV (WINDOPATH): NOT DETECTED

## 2018-04-20 ENCOUNTER — Ambulatory Visit: Payer: BLUE CROSS/BLUE SHIELD

## 2018-04-23 ENCOUNTER — Ambulatory Visit
Admission: RE | Admit: 2018-04-23 | Discharge: 2018-04-23 | Disposition: A | Discharge status: Home / Self Care | Payer: BLUE CROSS/BLUE SHIELD | Source: Ambulatory Visit | Attending: Internal Medicine | Admitting: Internal Medicine

## 2018-04-23 DIAGNOSIS — R748 Abnormal levels of other serum enzymes: Secondary | ICD-10-CM | POA: Insufficient documentation

## 2018-04-23 DIAGNOSIS — R932 Abnormal findings on diagnostic imaging of liver and biliary tract: Secondary | ICD-10-CM | POA: Diagnosis not present

## 2018-04-23 DIAGNOSIS — R822 Biliuria: Secondary | ICD-10-CM | POA: Diagnosis not present

## 2018-05-13 DIAGNOSIS — Z23 Encounter for immunization: Secondary | ICD-10-CM | POA: Diagnosis not present

## 2018-05-16 ENCOUNTER — Other Ambulatory Visit: Payer: Self-pay | Admitting: Internal Medicine

## 2018-07-11 ENCOUNTER — Other Ambulatory Visit: Payer: Self-pay | Admitting: Internal Medicine

## 2018-07-12 ENCOUNTER — Telehealth: Payer: Self-pay

## 2018-07-12 NOTE — Telephone Encounter (Signed)
Is it okay to schedule pt in an available 11:30 or evening same day appt slot?

## 2018-07-12 NOTE — Telephone Encounter (Signed)
Refilled: 04/08/2018 Last OV: 04/08/2018 Next OV: not scheduled

## 2018-07-12 NOTE — Telephone Encounter (Signed)
Copied from CRM [id number]. Topic: Appointment Scheduling - Scheduling Inquiry for Clinic >> Jul 12, 2018  4:18 PM Angela NevinWilliams, Candice N wrote: Patient is requesting to be worked in with Dr. Darrick Huntsmanullo sometime this month for a weigh in and medication refill visit. Please advise.   [phone]

## 2018-07-12 NOTE — Telephone Encounter (Signed)
Refilled for 30 days only.  OFFICE VISIT NEEDED prior to any more refills 

## 2018-07-12 NOTE — Telephone Encounter (Signed)
The medication has been refilled for 30 days    Yes you can use  A 4:30 or an 11:30

## 2018-07-14 NOTE — Telephone Encounter (Signed)
Spoke with pt to let her know that Dr. Darrick Huntsmanullo has refilled her medication and scheduled her an appt. Pt is aware of appt date and time.

## 2018-07-22 ENCOUNTER — Ambulatory Visit: Payer: BLUE CROSS/BLUE SHIELD | Admitting: Internal Medicine

## 2018-07-22 ENCOUNTER — Encounter: Payer: Self-pay | Admitting: Internal Medicine

## 2018-07-22 DIAGNOSIS — Z6832 Body mass index (BMI) 32.0-32.9, adult: Secondary | ICD-10-CM

## 2018-07-22 DIAGNOSIS — E6609 Other obesity due to excess calories: Secondary | ICD-10-CM

## 2018-07-22 MED ORDER — PHENTERMINE HCL 37.5 MG PO TABS: ORAL_TABLET | ORAL | 2 refills | Status: DC

## 2018-07-22 NOTE — Progress Notes (Signed)
Subjective:  Patient ID: Rachel Riggs, female    DOB: 05/10/1961  Age: 57 y.o. MRN: [id number]  CC: The encounter diagnosis was Class 1 obesity due to excess calories without serious comorbidity with body mass index (BMI) of 32.0 to 32.9 in adult.  HPI Rachel Riggs presents for follow up on weight loss therapy using phentermine   She has lost 16 lbs since September .  She is tolerating the medication without side effects and exercising regularly.   Outpatient Medications Prior to Visit  Medication Sig Dispense Refill  . albuterol (VENTOLIN HFA) 108 (90 Base) MCG/ACT inhaler Inhale 2 puffs into the lungs every 6 (six) hours as needed for wheezing. 1 Inhaler 2  . atorvastatin (LIPITOR) 20 MG tablet TAKE 1 TABLET BY MOUTH ONCE DAILY 90 tablet 1  . butalbital-acetaminophen-caffeine (FIORICET, ESGIC) 50-325-40 MG tablet TAKE ONE TO TWO TABLETS BY MOUTH EVERY 6 HOURS AS NEEDED FOR HEADACHE 60 tablet 0  . etodolac (LODINE) 500 MG tablet Take 1 tablet (500 mg total) by mouth 2 (two) times daily. 60 tablet 0  . levocetirizine (XYZAL ALLERGY 24HR) 5 MG tablet     . meclizine (ANTIVERT) 25 MG tablet TAKE 1 TABLET BY MOUTH THREE TIMES DAILY AS NEEDED 90 tablet 0  . metFORMIN (GLUCOPHAGE) 500 MG tablet TAKE ONE TABLET BY MOUTH TWICE DAILY WITH FOOD 180 tablet 3  . MICROGESTIN 1-20 MG-MCG tablet TAKE ONE TABLET BY MOUTH ONCE DAILY *NEEDS  OFFICE  VISIT  FOR  FURTHER  REFILLS* 63 tablet 5  . Potassium 75 MG TABS Take 1 tablet by mouth daily.    Marland Kitchen. triamcinolone cream (KENALOG) 0.1 % Apply 1 application topically 2 (two) times daily. 30 g 0  . phentermine (ADIPEX-P) 37.5 MG tablet TAKE 1 TABLET BY MOUTH ONCE DAILY BEFORE BREAKFAST 30 tablet 0   No facility-administered medications prior to visit.     Review of Systems;  Patient denies headache, fevers, malaise, unintentional weight loss, skin rash, eye pain, sinus congestion and sinus pain, sore throat, dysphagia,  hemoptysis , cough, dyspnea,  wheezing, chest pain, palpitations, orthopnea, edema, abdominal pain, nausea, melena, diarrhea, constipation, flank pain, dysuria, hematuria, urinary  Frequency, nocturia, numbness, tingling, seizures,  Focal weakness, Loss of consciousness,  Tremor, insomnia, depression, anxiety, and suicidal ideation.      Objective:  BP 138/74 (BP Location: Left Arm, Patient Position: Sitting, Cuff Size: Normal)   Pulse 100   Temp 98.6 F (37 C) (Oral)   Resp 15   Ht 5' 7.25" (1.708 m)   Wt 217 lb (98.4 kg)   SpO2 96%   BMI 33.73 kg/m   BP Readings from Last 3 Encounters:  07/22/18 138/74  04/08/18 132/88  03/05/18 140/80    Wt Readings from Last 3 Encounters:  07/22/18 217 lb (98.4 kg)  04/08/18 234 lb 9.6 oz (106.4 kg)  03/05/18 216 lb (98 kg)    General appearance: alert, cooperative and appears stated age Ears: normal TM's and external ear canals both ears Throat: lips, mucosa, and tongue normal; teeth and gums normal Neck: no adenopathy, no carotid bruit, supple, symmetrical, trachea midline and thyroid not enlarged, symmetric, no tenderness/mass/nodules Back: symmetric, no curvature. ROM normal. No CVA tenderness. Lungs: clear to auscultation bilaterally Heart: regular rate and rhythm, S1, S2 normal, no murmur, click, rub or gallop Abdomen: soft, non-tender; bowel sounds normal; no masses,  no organomegaly Pulses: 2+ and symmetric Skin: Skin color, texture, turgor normal. No rashes or lesions Lymph  nodes: Cervical, supraclavicular, and axillary nodes normal.  Lab Results  Component Value Date   HGBA1C 6.8 (H) 04/02/2018   HGBA1C 6.5 02/02/2017   HGBA1C 6.0 07/01/2016    Lab Results  Component Value Date   CREATININE 0.70 04/02/2018   CREATININE 0.70 02/02/2017   CREATININE 0.63 07/01/2016    Lab Results  Component Value Date   WBC 7.4 12/21/2013   HGB 11.7 (L) 12/21/2013   HCT 35.5 (L) 12/21/2013   PLT 351.0 12/21/2013   GLUCOSE 161 (H) 04/02/2018   CHOL 224 (H)  04/02/2018   TRIG 196.0 (H) 04/02/2018   HDL 71.90 04/02/2018   LDLDIRECT 107.0 07/01/2016   LDLCALC 112 (H) 04/02/2018   ALT 10 04/02/2018   AST 13 04/02/2018   NA 136 04/02/2018   K 4.4 04/02/2018   CL 102 04/02/2018   CREATININE 0.70 04/02/2018   BUN 12 04/02/2018   CO2 23 04/02/2018   TSH 3.35 10/14/2013   HGBA1C 6.8 (H) 04/02/2018   MICROALBUR 1.9 07/01/2016    [place] Abdomen Limited Ruq  Result Date: 04/23/2018 CLINICAL DATA:  Elevated liver enzymes EXAM: ULTRASOUND ABDOMEN LIMITED RIGHT UPPER QUADRANT COMPARISON:  None. FINDINGS: Gallbladder: No gallstones or wall thickening visualized. There is no pericholecystic fluid. No sonographic Murphy sign noted by sonographer. Common bile duct: Diameter: 5 mm. No intrahepatic or extrahepatic biliary duct dilatation. Liver: No focal lesion identified. Liver echogenicity overall is increased. Portal vein is patent on color Doppler imaging with normal direction of blood flow towards the liver. IMPRESSION: Diffuse increase in liver echogenicity, a finding felt to be indicative of hepatic steatosis. While no focal liver lesions are identified on this study, it must be cautioned that the sensitivity of ultrasound for detection of focal liver lesions is diminished in this circumstance. Study otherwise unremarkable. Electronically Signed   By: Bretta BangWilliam  Woodruff III M.D.   On: 04/23/2018 08:32    Assessment & Plan:   Problem List Items Addressed This Visit    Obesity    Reviewed her current success at weight loss,  Her goal weight for BMI < 30.Marland Kitchen.  Using  the phentermine , She has lost in excess of  5% of body weight over the last 3 months  Following a low glycemic index diet and engaging in regular exercise a minimum of 5 days per week. Phentermine has been refilled.       Relevant Medications   phentermine (ADIPEX-P) 37.5 MG tablet      I am having Rachel Riggs maintain her albuterol, metFORMIN, MICROGESTIN, meclizine, etodolac,  butalbital-acetaminophen-caffeine, levocetirizine, Potassium, triamcinolone cream, atorvastatin, and phentermine.  Meds ordered this encounter  Medications  . phentermine (ADIPEX-P) 37.5 MG tablet    Sig: TAKE 1 TABLET BY MOUTH ONCE DAILY BEFORE BREAKFAST    Dispense:  30 tablet    Refill:  2    Medications Discontinued During This Encounter  Medication Reason  . phentermine (ADIPEX-P) 37.5 MG tablet Reorder    Follow-up: No follow-ups on file.   Sherlene Shamseresa L Aranza Geddes, MD

## 2018-07-25 ENCOUNTER — Encounter: Payer: Self-pay | Admitting: Internal Medicine

## 2018-07-25 NOTE — Assessment & Plan Note (Signed)
Reviewed her current success at weight loss,  Her goal weight for BMI < 30.Marland Kitchen.  Using  the phentermine , She has lost in excess of  5% of body weight over the last 3 months  Following a low glycemic index diet and engaging in regular exercise a minimum of 5 days per week. Phentermine has been refilled.

## 2018-09-26 ENCOUNTER — Other Ambulatory Visit: Payer: Self-pay | Admitting: Internal Medicine

## 2018-09-27 ENCOUNTER — Other Ambulatory Visit: Payer: Self-pay

## 2018-09-27 DIAGNOSIS — E782 Mixed hyperlipidemia: Secondary | ICD-10-CM

## 2018-09-27 DIAGNOSIS — E119 Type 2 diabetes mellitus without complications: Secondary | ICD-10-CM

## 2018-09-28 MED ORDER — ETODOLAC 500 MG PO TABS: 500.0000 mg | ORAL_TABLET | Freq: Two times a day (BID) | ORAL | 0 refills | Status: DC

## 2018-09-28 NOTE — Telephone Encounter (Signed)
Refilled: 02/16/2018 Last OV: 07/22/2018 Next OV: not scheduled Last Labs: 04/02/2018

## 2018-10-13 ENCOUNTER — Other Ambulatory Visit: Payer: Self-pay | Admitting: Internal Medicine

## 2018-10-13 MED ORDER — ALBUTEROL SULFATE HFA 108 (90 BASE) MCG/ACT IN AERS: 2.0000 | INHALATION_SPRAY | Freq: Four times a day (QID) | RESPIRATORY_TRACT | 2 refills | Status: DC | PRN

## 2018-10-25 MED ORDER — ATORVASTATIN CALCIUM 20 MG PO TABS: 20.0000 mg | ORAL_TABLET | Freq: Every day | ORAL | 1 refills | Status: DC

## 2018-10-25 MED ORDER — METFORMIN HCL 500 MG PO TABS: 500.0000 mg | ORAL_TABLET | Freq: Two times a day (BID) | ORAL | 1 refills | Status: DC

## 2018-10-25 MED ORDER — NORETHINDRONE ACET-ETHINYL EST 1-20 MG-MCG PO TABS: ORAL_TABLET | ORAL | 1 refills | Status: DC

## 2019-02-14 ENCOUNTER — Other Ambulatory Visit: Payer: Self-pay

## 2019-02-21 ENCOUNTER — Ambulatory Visit: Payer: BLUE CROSS/BLUE SHIELD | Admitting: Internal Medicine

## 2019-02-21 ENCOUNTER — Other Ambulatory Visit: Payer: Self-pay

## 2019-02-21 ENCOUNTER — Encounter: Payer: Self-pay | Admitting: Internal Medicine

## 2019-02-21 ENCOUNTER — Other Ambulatory Visit (HOSPITAL_COMMUNITY)
Admission: RE | Admit: 2019-02-21 | Discharge: 2019-02-21 | Disposition: A | Discharge status: Home / Self Care | Payer: BC Managed Care – PPO | Source: Ambulatory Visit | Attending: Internal Medicine | Admitting: Internal Medicine

## 2019-02-21 VITALS — BP 140/82 | HR 87 | Temp 99.0°F | Resp 15 | Ht 67.25 in | Wt 230.8 lb

## 2019-02-21 DIAGNOSIS — E6609 Other obesity due to excess calories: Secondary | ICD-10-CM

## 2019-02-21 DIAGNOSIS — E782 Mixed hyperlipidemia: Secondary | ICD-10-CM

## 2019-02-21 DIAGNOSIS — Z124 Encounter for screening for malignant neoplasm of cervix: Secondary | ICD-10-CM | POA: Diagnosis not present

## 2019-02-21 DIAGNOSIS — E1169 Type 2 diabetes mellitus with other specified complication: Secondary | ICD-10-CM

## 2019-02-21 DIAGNOSIS — D508 Other iron deficiency anemias: Secondary | ICD-10-CM

## 2019-02-21 DIAGNOSIS — Z6832 Body mass index (BMI) 32.0-32.9, adult: Secondary | ICD-10-CM

## 2019-02-21 DIAGNOSIS — Z8049 Family history of malignant neoplasm of other genital organs: Secondary | ICD-10-CM

## 2019-02-21 DIAGNOSIS — Z7189 Other specified counseling: Secondary | ICD-10-CM

## 2019-02-21 DIAGNOSIS — E669 Obesity, unspecified: Secondary | ICD-10-CM

## 2019-02-21 DIAGNOSIS — N950 Postmenopausal bleeding: Secondary | ICD-10-CM

## 2019-02-21 DIAGNOSIS — E119 Type 2 diabetes mellitus without complications: Secondary | ICD-10-CM

## 2019-02-21 DIAGNOSIS — Z1211 Encounter for screening for malignant neoplasm of colon: Secondary | ICD-10-CM

## 2019-02-21 NOTE — Progress Notes (Signed)
Subjective:  Patient ID: Rachel Riggs, female    DOB: 12/04/1960  Age: 58 y.o. MRN: [id number]  CC: The primary encounter diagnosis was Cervical cancer screening. Diagnoses of Mixed hyperlipidemia, Other iron deficiency anemia, Diabetes mellitus without complication, Post-menopausal bleeding, Family history of cervical cancer, Class 1 obesity due to excess calories without serious comorbidity with body mass index (BMI) of 32.0 to 32.9 in adult, Type 2 diabetes mellitus with obesity (HCC), Colon cancer screening, and Educated About Covid-19 Virus Infection were also pertinent to this visit.  HPI Rachel BunkerCheryl A Luckenbach presents for evaluation of breakthrough bleeding. ,  Patient getting annual PAP semars last one Sept 2019 and normal., due to FH of cervical CA and personal history of cervical erosion.  She continues to take OCP' s due to history of irregular heavy menses and has had spotting in the past during the week of placebo,  But in early July she states that she had a heavy menses that lasted for 4 days and included clots.     The patient has no signs or symptoms of COVID 19 infection (fever, cough, sore throat  or shortness of breath beyond what is typical for patient).  Patient denies contact with other persons with the above mentioned symptoms or with anyone confirmed to have COVID 19 .  Obesity/DM :  She has gained weight since the pandemic due to lack of access to gym equipment .  She is not checking her blood sugars and is overdue for surveillance labs including cholesterol but requests a 30 day period to "get back to eating correctly"  Lab Results  Component Value Date   WBC 7.4 12/21/2013   HGB 11.7 (L) 12/21/2013   HCT 35.5 (L) 12/21/2013   MCV 84.2 12/21/2013   PLT 351.0 12/21/2013      Outpatient Medications Prior to Visit  Medication Sig Dispense Refill  . albuterol (VENTOLIN HFA) 108 (90 Base) MCG/ACT inhaler Inhale 2 puffs into the lungs every 6 (six) hours as needed for  wheezing. 1 Inhaler 2  . atorvastatin (LIPITOR) 20 MG tablet Take 1 tablet (20 mg total) by mouth daily. 90 tablet 1  . butalbital-acetaminophen-caffeine (FIORICET, ESGIC) 50-325-40 MG tablet TAKE 1 TO 2 TABLETS BY MOUTH EVERY 6 HOURS AS NEEDED FOR HEADACHE 60 tablet 0  . etodolac (LODINE) 500 MG tablet Take 1 tablet (500 mg total) by mouth 2 (two) times daily. 60 tablet 0  . levocetirizine (XYZAL ALLERGY 24HR) 5 MG tablet     . meclizine (ANTIVERT) 25 MG tablet TAKE 1 TABLET BY MOUTH THREE TIMES DAILY AS NEEDED 90 tablet 0  . metFORMIN (GLUCOPHAGE) 500 MG tablet Take 1 tablet (500 mg total) by mouth 2 (two) times daily with a meal. 180 tablet 1  . norethindrone-ethinyl estradiol (MICROGESTIN) 1-20 MG-MCG tablet TAKE ONE TABLET BY MOUTH ONCE DAILY *NEEDS  OFFICE  VISIT  FOR  FURTHER  REFILLS* 90 tablet 1  . phentermine (ADIPEX-P) 37.5 MG tablet TAKE 1 TABLET BY MOUTH ONCE DAILY BEFORE BREAKFAST 30 tablet 2  . Potassium 75 MG TABS Take 1 tablet by mouth daily.    Marland Kitchen. triamcinolone cream (KENALOG) 0.1 % Apply 1 application topically 2 (two) times daily. 30 g 0   No facility-administered medications prior to visit.     Review of Systems;  Patient denies headache, fevers, malaise, unintentional weight loss, skin rash, eye pain, sinus congestion and sinus pain, sore throat, dysphagia,  hemoptysis , cough, dyspnea, wheezing, chest pain, palpitations, orthopnea,  edema, abdominal pain, nausea, melena, diarrhea, constipation, flank pain, dysuria, hematuria, urinary  Frequency, nocturia, numbness, tingling, seizures,  Focal weakness, Loss of consciousness,  Tremor, insomnia, depression, anxiety, and suicidal ideation.      Objective:  BP 140/82 (BP Location: Left Arm, Patient Position: Sitting, Cuff Size: Large)   Pulse 87   Temp 99 F (37.2 C) (Oral)   Resp 15   Ht 5' 7.25" (1.708 m)   Wt 230 lb 12.8 oz (104.7 kg)   SpO2 98%   BMI 35.88 kg/m   BP Readings from Last 3 Encounters:  02/21/19  140/82  07/22/18 138/74  04/08/18 132/88    Wt Readings from Last 3 Encounters:  02/21/19 230 lb 12.8 oz (104.7 kg)  07/22/18 217 lb (98.4 kg)  04/08/18 234 lb 9.6 oz (106.4 kg)   General Appearance:    Alert, cooperative, no distress, appears stated age  Head:    Normocephalic, without obvious abnormality, atraumatic  Eyes:    PERRL, conjunctiva/corneas clear, EOM's intact, fundi    benign, both eyes  Ears:    Normal TM's and external ear canals, both ears  Nose:   Nares normal, septum midline, mucosa normal, no drainage    or sinus tenderness  Throat:   Lips, mucosa, and tongue normal; teeth and gums normal  Neck:   Supple, symmetrical, trachea midline, no adenopathy;    thyroid:  no enlargement/tenderness/nodules; no carotid   bruit or JVD  Back:     Symmetric, no curvature, ROM normal, no CVA tenderness  Lungs:     Clear to auscultation bilaterally, respirations unlabored  Chest Wall:    No tenderness or deformity   Heart:    Regular rate and rhythm, S1 and S2 normal, no murmur, rub   or gallop        Genitalia:    Pelvic: cervix erythematous and friable,  external genitalia normal, no adnexal masses or tenderness, no cervical motion tenderness, rectovaginal septum normal, uterus normal size, shape, and consistency and vagina normal without abnormal discharge  Extremities:   Extremities normal, atraumatic, no cyanosis or edema  Pulses:   2+ and symmetric all extremities  Skin:   Skin color, texture, turgor normal, no rashes or lesions  Lymph nodes:   Cervical, supraclavicular, and axillary nodes normal  Neurologic:   CNII-XII intact, normal strength, sensation and reflexes    throughout    Lab Results  Component Value Date   HGBA1C 6.8 (H) 04/02/2018   HGBA1C 6.5 02/02/2017   HGBA1C 6.0 07/01/2016    Lab Results  Component Value Date   CREATININE 0.70 04/02/2018   CREATININE 0.70 02/02/2017   CREATININE 0.63 07/01/2016    Lab Results  Component Value Date    WBC 7.4 12/21/2013   HGB 11.7 (L) 12/21/2013   HCT 35.5 (L) 12/21/2013   PLT 351.0 12/21/2013   GLUCOSE 161 (H) 04/02/2018   CHOL 224 (H) 04/02/2018   TRIG 196.0 (H) 04/02/2018   HDL 71.90 04/02/2018   LDLDIRECT 107.0 07/01/2016   LDLCALC 112 (H) 04/02/2018   ALT 10 04/02/2018   AST 13 04/02/2018   NA 136 04/02/2018   K 4.4 04/02/2018   CL 102 04/02/2018   CREATININE 0.70 04/02/2018   BUN 12 04/02/2018   CO2 23 04/02/2018   TSH 3.35 10/14/2013   HGBA1C 6.8 (H) 04/02/2018   MICROALBUR 1.9 07/01/2016    US Abdomen Limited Ruq  Result Date: 04/23/2018 CLINICAL DATA:  Elevated liver enzymes EXAM: ULTRASOUND  ABDOMEN LIMITED RIGHT UPPER QUADRANT COMPARISON:  None. FINDINGS: Gallbladder: No gallstones or wall thickening visualized. There is no pericholecystic fluid. No sonographic Murphy sign noted by sonographer. Common bile duct: Diameter: 5 mm. No intrahepatic or extrahepatic biliary duct dilatation. Liver: No focal lesion identified. Liver echogenicity overall is increased. Portal vein is patent on color Doppler imaging with normal direction of blood flow towards the liver. IMPRESSION: Diffuse increase in liver echogenicity, a finding felt to be indicative of hepatic steatosis. While no focal liver lesions are identified on this study, it must be cautioned that the sensitivity of ultrasound for detection of focal liver lesions is diminished in this circumstance. Study otherwise unremarkable. Electronically Signed   By: Bretta BangWilliam  Woodruff III M.D.   On: 04/23/2018 08:32    Assessment & Plan:   Problem List Items Addressed This Visit      Unprioritized   Type 2 diabetes mellitus with obesity (HCC)     well-controlled on diet alone .  hemoglobin A1c is  less than 7.0 . Patient is up-to-date on eye exams and foot exam is normal today. Patient has no history of microalbuminuria but is overdue for assessment . Patient is tolerating statin therapy for CAD risk reduction .  Lab Results   Component Value Date   HGBA1C 6.8 (H) 04/02/2018   Lab Results  Component Value Date   MICROALBUR 1.9 07/01/2016           Post-menopausal bleeding   Relevant Orders   Ambulatory referral to Gynecology   Obesity    I have addressed  BMI and recommended a low glycemic index diet utilizing smaller more frequent meals to increase metabolism.  I have also recommended that patient start exercising with a goal of 30 minutes of aerobic exercise a minimum of 5 days per week.      Hyperlipidemia    Tolerating atorvastatin . KETO diet raised her LDL at last check.  She will return for fasting labs in one month  Lab Results  Component Value Date   CHOL 224 (H) 04/02/2018   HDL 71.90 04/02/2018   LDLCALC 112 (H) 04/02/2018   LDLDIRECT 107.0 07/01/2016   TRIG 196.0 (H) 04/02/2018   CHOLHDL 3 04/02/2018   Lab Results  Component Value Date   ALT 10 04/02/2018   AST 13 04/02/2018   ALKPHOS 104 04/02/2018   BILITOT 0.8 04/02/2018                Relevant Orders   TSH   Lipid panel   Family history of cervical cancer    Annual PAP smear done today       Educated About Covid-19 Virus Infection    Educated patient on the newly broadened list of signs and symptoms of COVID-19 infection and ways to avoid the viral infection including washing hands frequently with soap and water,  using hand sanitizer if unable to wash, avoiding touching face,  staying at home and limiting visitors,  and avoiding contact with people coming in and out of home.  Discussed the potential ineffectiveness of hand sanitizer if left in environments > 110 degrees (ie , the car).  Reminded patient to call office with questions/concerns.  The importance of continued social distancing was discussed today . Patient was screened for the development of any unsafe behaviors or habits that may have developed as a result of the social impact of the virus , including alcohol abuse,  Domestic violence, tobacco abuse and  overeating.  Anemia, iron deficiency    Mild, etiology unclear, has not been rechecked since 2017,  Negative cologuard Feb 2017.  She has deferred colonoscopy.         Relevant Orders   CBC with Differential/Platelet   Iron, TIBC and Ferritin Panel    Other Visit Diagnoses    Cervical cancer screening    -  Primary   Relevant Orders   Cytology - PAP( [place])   Colon cancer screening       Relevant Orders   Cologuard      I am having Daune A. Schoenfeldt maintain her meclizine, levocetirizine, Potassium, triamcinolone cream, phentermine, butalbital-acetaminophen-caffeine, etodolac, albuterol, metFORMIN, norethindrone-ethinyl estradiol, and atorvastatin.  No orders of the defined types were placed in this encounter.   There are no discontinued medications.  Follow-up: No follow-ups on file.   Sherlene Shamseresa L Lakeesha Fontanilla, MD

## 2019-02-22 DIAGNOSIS — Z20822 Contact with and (suspected) exposure to covid-19: Secondary | ICD-10-CM | POA: Insufficient documentation

## 2019-02-22 DIAGNOSIS — N950 Postmenopausal bleeding: Secondary | ICD-10-CM | POA: Insufficient documentation

## 2019-02-22 DIAGNOSIS — Z7189 Other specified counseling: Secondary | ICD-10-CM | POA: Insufficient documentation

## 2019-02-22 NOTE — Assessment & Plan Note (Signed)
I have addressed  BMI and recommended a low glycemic index diet utilizing smaller more frequent meals to increase metabolism.  I have also recommended that patient start exercising with a goal of 30 minutes of aerobic exercise a minimum of 5 days per week.  

## 2019-02-22 NOTE — Assessment & Plan Note (Signed)
Annual PAP smear done today

## 2019-02-22 NOTE — Assessment & Plan Note (Signed)
Tolerating atorvastatin . KETO diet raised her LDL at last check.  She will return for fasting labs in one month  Lab Results  Component Value Date   CHOL 224 (H) 04/02/2018   HDL 71.90 04/02/2018   LDLCALC 112 (H) 04/02/2018   LDLDIRECT 107.0 07/01/2016   TRIG 196.0 (H) 04/02/2018   CHOLHDL 3 04/02/2018   Lab Results  Component Value Date   ALT 10 04/02/2018   AST 13 04/02/2018   ALKPHOS 104 04/02/2018   BILITOT 0.8 04/02/2018

## 2019-02-22 NOTE — Assessment & Plan Note (Signed)
Mild, etiology unclear, has not been rechecked since 2017,  Negative cologuard Feb 2017.  She has deferred colonoscopy.

## 2019-02-22 NOTE — Assessment & Plan Note (Signed)
Educated patient on the newly broadened list of signs and symptoms of COVID-19 infection and ways to avoid the viral infection including washing hands frequently with soap and water,  using hand sanitizer if unable to wash, avoiding touching face,  staying at home and limiting visitors,  and avoiding contact with people coming in and out of home.  Discussed the potential ineffectiveness of hand sanitizer if left in environments > 110 degrees (ie , the car).  Reminded patient to call office with questions/concerns.  The importance of continued social distancing was discussed today . Patient was screened for the development of any unsafe behaviors or habits that may have developed as a result of the social impact of the virus , including alcohol abuse,  Domestic violence, tobacco abuse and overeating.    

## 2019-02-22 NOTE — Assessment & Plan Note (Signed)
well-controlled on diet alone .  hemoglobin A1c is  less than 7.0 . Patient is up-to-date on eye exams and foot exam is normal today. Patient has no history of microalbuminuria but is overdue for assessment . Patient is tolerating statin therapy for CAD risk reduction .  Lab Results  Component Value Date   HGBA1C 6.8 (H) 04/02/2018   Lab Results  Component Value Date   MICROALBUR 1.9 07/01/2016

## 2019-02-25 LAB — CYTOLOGY - PAP
Diagnosis: UNDETERMINED — AB
HPV: NOT DETECTED

## 2019-03-17 DIAGNOSIS — N938 Other specified abnormal uterine and vaginal bleeding: Secondary | ICD-10-CM | POA: Diagnosis not present

## 2019-03-17 DIAGNOSIS — Z8049 Family history of malignant neoplasm of other genital organs: Secondary | ICD-10-CM | POA: Diagnosis not present

## 2019-03-17 DIAGNOSIS — N939 Abnormal uterine and vaginal bleeding, unspecified: Secondary | ICD-10-CM | POA: Diagnosis not present

## 2019-03-17 DIAGNOSIS — N951 Menopausal and female climacteric states: Secondary | ICD-10-CM | POA: Diagnosis not present

## 2019-03-25 ENCOUNTER — Other Ambulatory Visit (INDEPENDENT_AMBULATORY_CARE_PROVIDER_SITE_OTHER): Payer: BC Managed Care – PPO

## 2019-03-25 ENCOUNTER — Other Ambulatory Visit: Payer: Self-pay

## 2019-03-25 DIAGNOSIS — E119 Type 2 diabetes mellitus without complications: Secondary | ICD-10-CM

## 2019-03-25 DIAGNOSIS — E782 Mixed hyperlipidemia: Secondary | ICD-10-CM | POA: Diagnosis not present

## 2019-03-25 DIAGNOSIS — D508 Other iron deficiency anemias: Secondary | ICD-10-CM

## 2019-03-25 LAB — CBC WITH DIFFERENTIAL/PLATELET
Basophils Absolute: 0 10*3/uL (ref 0.0–0.1)
Basophils Relative: 0.2 % (ref 0.0–3.0)
Eosinophils Absolute: 0.1 10*3/uL (ref 0.0–0.7)
Eosinophils Relative: 1.5 % (ref 0.0–5.0)
HCT: 35.6 % — ABNORMAL LOW (ref 36.0–46.0)
Hemoglobin: 11.5 g/dL — ABNORMAL LOW (ref 12.0–15.0)
Lymphocytes Relative: 35.4 % (ref 12.0–46.0)
Lymphs Abs: 3.3 10*3/uL (ref 0.7–4.0)
MCHC: 32.4 g/dL (ref 30.0–36.0)
MCV: 80.1 fl (ref 78.0–100.0)
Monocytes Absolute: 0.5 10*3/uL (ref 0.1–1.0)
Monocytes Relative: 5.4 % (ref 3.0–12.0)
Neutro Abs: 5.3 10*3/uL (ref 1.4–7.7)
Neutrophils Relative %: 57.5 % (ref 43.0–77.0)
Platelets: 327 10*3/uL (ref 150.0–400.0)
RBC: 4.44 Mil/uL (ref 3.87–5.11)
RDW: 17.5 % — ABNORMAL HIGH (ref 11.5–15.5)
WBC: 9.2 10*3/uL (ref 4.0–10.5)

## 2019-03-25 LAB — COMPREHENSIVE METABOLIC PANEL
ALT: 12 U/L (ref 0–35)
AST: 18 U/L (ref 0–37)
Albumin: 4 g/dL (ref 3.5–5.2)
Alkaline Phosphatase: 119 U/L — ABNORMAL HIGH (ref 39–117)
BUN: 11 mg/dL (ref 6–23)
CO2: 23 mEq/L (ref 19–32)
Calcium: 9.3 mg/dL (ref 8.4–10.5)
Chloride: 100 mEq/L (ref 96–112)
Creatinine, Ser: 0.66 mg/dL (ref 0.40–1.20)
GFR: 91.81 mL/min (ref >60.00)
Glucose, Bld: 197 mg/dL — ABNORMAL HIGH (ref 70–99)
Potassium: 4.3 mEq/L (ref 3.5–5.1)
Sodium: 135 mEq/L (ref 135–145)
Total Bilirubin: 0.6 mg/dL (ref 0.2–1.2)
Total Protein: 7.1 g/dL (ref 6.0–8.3)

## 2019-03-25 LAB — LIPID PANEL
Cholesterol: 203 mg/dL — ABNORMAL HIGH (ref 0–200)
HDL: 77.7 mg/dL (ref >39.00)
LDL Cholesterol: 92 mg/dL (ref 0–99)
NonHDL: 125.64
Total CHOL/HDL Ratio: 3
Triglycerides: 166 mg/dL — ABNORMAL HIGH (ref 0.0–149.0)
VLDL: 33.2 mg/dL (ref 0.0–40.0)

## 2019-03-25 LAB — TSH: TSH: 3.85 u[IU]/mL (ref 0.35–4.50)

## 2019-03-25 LAB — HEMOGLOBIN A1C: Hgb A1c MFr Bld: 8.5 % — ABNORMAL HIGH (ref 4.6–6.5)

## 2019-03-25 NOTE — Addendum Note (Signed)
Addended by: Elpidio Galea T on: 03/25/2019 08:22 AM   Modules accepted: Orders

## 2019-03-26 LAB — IRON,TIBC AND FERRITIN PANEL
%SAT: 10 % (calc) — ABNORMAL LOW (ref 16–45)
Ferritin: 16 ng/mL (ref 16–232)
Iron: 55 ug/dL (ref 45–160)
TIBC: 564 mcg/dL (calc) — ABNORMAL HIGH (ref 250–450)

## 2019-04-20 ENCOUNTER — Other Ambulatory Visit: Payer: Self-pay | Admitting: Internal Medicine

## 2019-04-21 NOTE — Telephone Encounter (Signed)
Refilled: 04/08/2018 Last OV: 02/21/2019 Next OV: not scheduled

## 2019-04-24 ENCOUNTER — Other Ambulatory Visit: Payer: Self-pay | Admitting: Internal Medicine

## 2019-05-02 ENCOUNTER — Telehealth: Payer: Self-pay | Admitting: Internal Medicine

## 2019-05-02 NOTE — Telephone Encounter (Signed)
cologuard incomplete   MyChart message sent

## 2019-05-08 ENCOUNTER — Other Ambulatory Visit: Payer: Self-pay | Admitting: Internal Medicine

## 2019-05-15 ENCOUNTER — Other Ambulatory Visit: Payer: Self-pay | Admitting: Internal Medicine

## 2019-05-16 NOTE — Telephone Encounter (Signed)
Refilled: 09/27/2018 Last OV: 02/21/2019 Next OV: not scheduled

## 2019-05-18 ENCOUNTER — Other Ambulatory Visit: Payer: Self-pay | Admitting: Internal Medicine

## 2019-05-18 MED ORDER — ESCITALOPRAM OXALATE 5 MG PO TABS: 5.0000 mg | ORAL_TABLET | Freq: Every day | ORAL | 0 refills | Status: DC

## 2019-06-19 ENCOUNTER — Other Ambulatory Visit: Payer: Self-pay | Admitting: Internal Medicine

## 2019-06-28 DIAGNOSIS — E119 Type 2 diabetes mellitus without complications: Secondary | ICD-10-CM | POA: Diagnosis not present

## 2019-07-24 ENCOUNTER — Other Ambulatory Visit: Payer: Self-pay | Admitting: Family

## 2019-07-24 DIAGNOSIS — J40 Bronchitis, not specified as acute or chronic: Secondary | ICD-10-CM

## 2019-07-25 DIAGNOSIS — Z03818 Encounter for observation for suspected exposure to other biological agents ruled out: Secondary | ICD-10-CM | POA: Diagnosis not present

## 2019-07-25 DIAGNOSIS — Z20828 Contact with and (suspected) exposure to other viral communicable diseases: Secondary | ICD-10-CM | POA: Diagnosis not present

## 2019-07-26 MED ORDER — METFORMIN HCL 500 MG PO TABS: 1000.0000 mg | ORAL_TABLET | Freq: Two times a day (BID) | ORAL | 1 refills | Status: DC

## 2019-07-26 MED ORDER — BENZONATATE 200 MG PO CAPS: 200.0000 mg | ORAL_CAPSULE | Freq: Three times a day (TID) | ORAL | 1 refills | Status: DC | PRN

## 2019-08-30 NOTE — Telephone Encounter (Signed)
As long as she is doing ok and sugars are trending down, I am ok with scheduled appt with Dr Darrick Huntsman.  Have her to continue to spot check her sugars and record.  Any change in symptoms or problems, let us know.  Also, let me know if needs earlier appt.

## 2019-09-07 ENCOUNTER — Ambulatory Visit (INDEPENDENT_AMBULATORY_CARE_PROVIDER_SITE_OTHER): Payer: BC Managed Care – PPO | Admitting: Internal Medicine

## 2019-09-07 ENCOUNTER — Encounter: Payer: Self-pay | Admitting: Internal Medicine

## 2019-09-07 ENCOUNTER — Other Ambulatory Visit: Payer: Self-pay

## 2019-09-07 VITALS — Ht 67.25 in | Wt 233.0 lb

## 2019-09-07 DIAGNOSIS — Z6832 Body mass index (BMI) 32.0-32.9, adult: Secondary | ICD-10-CM

## 2019-09-07 DIAGNOSIS — K760 Fatty (change of) liver, not elsewhere classified: Secondary | ICD-10-CM | POA: Diagnosis not present

## 2019-09-07 DIAGNOSIS — E66811 Obesity, class 1: Secondary | ICD-10-CM

## 2019-09-07 DIAGNOSIS — E669 Obesity, unspecified: Secondary | ICD-10-CM

## 2019-09-07 DIAGNOSIS — E1169 Type 2 diabetes mellitus with other specified complication: Secondary | ICD-10-CM

## 2019-09-07 DIAGNOSIS — E782 Mixed hyperlipidemia: Secondary | ICD-10-CM

## 2019-09-07 DIAGNOSIS — Z20822 Contact with and (suspected) exposure to covid-19: Secondary | ICD-10-CM

## 2019-09-07 DIAGNOSIS — E6609 Other obesity due to excess calories: Secondary | ICD-10-CM

## 2019-09-07 DIAGNOSIS — E1165 Type 2 diabetes mellitus with hyperglycemia: Secondary | ICD-10-CM | POA: Diagnosis not present

## 2019-09-07 MED ORDER — TRULICITY 0.75 MG/0.5ML ~~LOC~~ SOAJ: SUBCUTANEOUS | 2 refills | Status: DC

## 2019-09-07 NOTE — Patient Instructions (Signed)
Dulaglutide injection What is this medicine? DULAGLUTIDE (DOO la GLOO tide) is used to improve blood sugar control in adults with type 2 diabetes. This medicine may be used with other oral diabetes medicines. This drug may also reduce the risk of heart attack or stroke if you have type 2 diabetes and risk factors for heart disease. This medicine may be used for other purposes; ask your health care provider or pharmacist if you have questions. COMMON BRAND NAME(S): Trulicity What should I tell my health care provider before I take this medicine? They need to know if you have any of these conditions:  endocrine tumors (MEN 2) or if someone in your family had these tumors  eye disease, vision problems  history of pancreatitis  kidney disease  liver disease  stomach problems  thyroid cancer or if someone in your family had thyroid cancer  an unusual or allergic reaction to dulaglutide, other medicines, foods, dyes, or preservatives  pregnant or trying to get pregnant  breast-feeding How should I use this medicine? This medicine is for injection under the skin of your upper leg (thigh), stomach area, or upper arm. It is usually given once every week (every 7 days). You will be taught how to prepare and give this medicine. Use exactly as directed. Take your medicine at regular intervals. Do not take it more often than directed. If you use this medicine with insulin, you should inject this medicine and the insulin separately. Do not mix them together. Do not give the injections right next to each other. Change (rotate) injection sites with each injection. It is important that you put your used needles and syringes in a special sharps container. Do not put them in a trash can. If you do not have a sharps container, call your pharmacist or healthcare provider to get one. A special MedGuide will be given to you by the pharmacist with each prescription and refill. Be sure to read this information  carefully each time. This drug comes with INSTRUCTIONS FOR USE. Ask your pharmacist for directions on how to use this drug. Read the information carefully. Talk to your pharmacist or health care provider if you have questions. Talk to your pediatrician regarding the use of this medicine in children. Special care may be needed. Overdosage: If you think you have taken too much of this medicine contact a poison control center or emergency room at once. NOTE: This medicine is only for you. Do not share this medicine with others. What if I miss a dose? If you miss a dose, take it as soon as you can within 3 days after the missed dose. Then take your next dose at your regular weekly time. If it has been longer than 3 days after the missed dose, do not take the missed dose. Take the next dose at your regular time. Do not take double or extra doses. If you have questions about a missed dose, contact your health care provider for advice. What may interact with this medicine?  other medicines for diabetes Many medications may cause changes in blood sugar, these include:  alcohol containing beverages  antiviral medicines for HIV or AIDS  aspirin and aspirin-like drugs  certain medicines for blood pressure, heart disease, irregular heart beat  chromium  diuretics  female hormones, such as estrogens or progestins, birth control pills  fenofibrate  gemfibrozil  isoniazid  lanreotide  female hormones or anabolic steroids  MAOIs like Carbex, Eldepryl, Marplan, Nardil, and Parnate  medicines for weight   loss  medicines for allergies, asthma, cold, or cough  medicines for depression, anxiety, or psychotic disturbances  niacin  nicotine  NSAIDs, medicines for pain and inflammation, like ibuprofen or naproxen  octreotide  pasireotide  pentamidine  phenytoin  probenecid  quinolone antibiotics such as ciprofloxacin, levofloxacin, ofloxacin  some herbal dietary  supplements  steroid medicines such as prednisone or cortisone  sulfamethoxazole; trimethoprim  thyroid hormones Some medications can hide the warning symptoms of low blood sugar (hypoglycemia). You may need to monitor your blood sugar more closely if you are taking one of these medications. These include:  beta-blockers, often used for high blood pressure or heart problems (examples include atenolol, metoprolol, propranolol)  clonidine  guanethidine  reserpine This list may not describe all possible interactions. Give your health care provider a list of all the medicines, herbs, non-prescription drugs, or dietary supplements you use. Also tell them if you smoke, drink alcohol, or use illegal drugs. Some items may interact with your medicine. What should I watch for while using this medicine? Visit your doctor or health care professional for regular checks on your progress. Drink plenty of fluids while taking this medicine. Check with your doctor or health care professional if you get an attack of severe diarrhea, nausea, and vomiting. The loss of too much body fluid can make it dangerous for you to take this medicine. A test called the HbA1C (A1C) will be monitored. This is a simple blood test. It measures your blood sugar control over the last 2 to 3 months. You will receive this test every 3 to 6 months. Learn how to check your blood sugar. Learn the symptoms of low and high blood sugar and how to manage them. Always carry a quick-source of sugar with you in case you have symptoms of low blood sugar. Examples include hard sugar candy or glucose tablets. Make sure others know that you can choke if you eat or drink when you develop serious symptoms of low blood sugar, such as seizures or unconsciousness. They must get medical help at once. Tell your doctor or health care professional if you have high blood sugar. You might need to change the dose of your medicine. If you are sick or  exercising more than usual, you might need to change the dose of your medicine. Do not skip meals. Ask your doctor or health care professional if you should avoid alcohol. Many nonprescription cough and cold products contain sugar or alcohol. These can affect blood sugar. Pens should never be shared. Even if the needle is changed, sharing may result in passing of viruses like hepatitis or HIV. Wear a medical ID bracelet or chain, and carry a card that describes your disease and details of your medicine and dosage times. What side effects may I notice from receiving this medicine? Side effects that you should report to your doctor or health care professional as soon as possible:  allergic reactions like skin rash, itching or hives, swelling of the face, lips, or tongue  breathing problems  changes in vision  diarrhea that continues or is severe  lump or swelling on the neck  severe nausea  signs and symptoms of infection like fever or chills; cough; sore throat; pain or trouble passing urine  signs and symptoms of low blood sugar such as feeling anxious, confusion, dizziness, increased hunger, unusually weak or tired, sweating, shakiness, cold, irritable, headache, blurred vision, fast heartbeat, loss of consciousness  signs and symptoms of kidney injury like trouble passing   urine or change in the amount of urine  trouble swallowing  unusual stomach upset or pain  vomiting Side effects that usually do not require medical attention (report to your doctor or health care professional if they continue or are bothersome):  diarrhea  loss of appetite  nausea  pain, redness, or irritation at site where injected  stomach upset This list may not describe all possible side effects. Call your doctor for medical advice about side effects. You may report side effects to FDA at 1-800-FDA-1088. Where should I keep my medicine? Keep out of the reach of children. Store unopened pens in a  refrigerator between 2 and 8 degrees C (36 and 46 degrees F). Do not freeze or use if the medicine has been frozen. Protect from light and excessive heat. Store in the carton until use. Each single-dose pen can be kept at room temperature, not to exceed 30 degrees C (86 degrees F) for a total of 14 days, if needed. Throw away any unused medicine after the expiration date on the label. NOTE: This sheet is a summary. It may not cover all possible information. If you have questions about this medicine, talk to your doctor, pharmacist, or health care provider.  2020 Elsevier/Gold Standard (2019-04-05 09:34:53)  

## 2019-09-07 NOTE — Assessment & Plan Note (Addendum)
She did not have any symptoms despite caring for sick husband for multiple days without masking.  Antibody test ordered

## 2019-09-07 NOTE — Assessment & Plan Note (Signed)
Agree with trial of Trulicity .  Fasting labs needed this week.  Advised to start with 0.75 mg weekly dose x 4 weeks and reduce dose of metformin to 500 mg bid.

## 2019-09-07 NOTE — Assessment & Plan Note (Signed)
She has eliminated alcohol from diet due to elevated blood sugars.  Reviewed moderate consumption as allowable as long as liver enzymes are normal

## 2019-09-07 NOTE — Progress Notes (Signed)
Virtual Visit via [email or web address]  This visit type was conducted due to national recommendations for restrictions regarding the COVID-19 pandemic (e.g. social distancing).  This format is felt to be most appropriate for this patient at this time.  All issues noted in this document were discussed and addressed.  No physical exam was performed (except for noted visual exam findings with Video Visits).   I connected with@ on 09/07/19 at  4:30 PM EST by a video enabled telemedicine application and verified that I am speaking with the correct person using two identifiers. Location patient: home Location provider: work or home office Persons participating in the virtual visit: patient, provider  I discussed the limitations, risks, security and privacy concerns of performing an evaluation and management service by telephone and the availability of in person appointments. I also discussed with the patient that there may be a patient responsible charge related to this service. The patient expressed understanding and agreed to proceed.   Reason for visit: follow up on   HPI:  59 yr old female with uncontrolled DM, last a1c 8.5 in August. Has been ignoring her own health for the past several months due to COVID PANDEMIC isolation and more recently due to the caregiver responsibilities in  Treating her husband's prolonged COVID 19 illness resulting in DKA and weight loss of 30 lbs (involuntary).  Purchased a new glucometer on Jan 23 and was shocked to receive a CBG reading  of 587. Became very proactive in changing her diet. Resumed metformin 1000 mg bid, Sugars now 150-200 fasting  .  Husband also diabetic and suffered greatly with COVID 19 INFECTION , losing 30 lbs after DKA,  Now on insulin.  Both very motivated to stay on low glycemic index diet, requesting trial of Trulicity . Wants to reduce or stop metformin due to side effects of feeling foggy headed attributed to medication.   Also stopped her antidepressant,   Still taking atorvastatin.  Motivated by birth of her first grandchild to [place] .    ROS: See pertinent positives and negatives per HPI.  Past Medical History:  Diagnosis Date  . Asthma   . Diabetes mellitus without complication (HCC)   . Hyperlipidemia   . Migraines     Past Surgical History:  Procedure Laterality Date  . HERNIA REPAIR  1991    Family History  Problem Relation Age of Onset  . Parkinsonism Mother 34  . Cancer Mother        uterus  . Heart failure Mother   . Hypertension Father   . Hyperlipidemia Father   . Diabetes Father   . Heart disease Father 69       CAD s/p 4 vessel CABG  . Cancer Brother 20       prostate CA  . Diabetes Maternal Grandmother   . Stroke Maternal Grandmother   . Heart disease Paternal Grandfather   . COPD Paternal Grandfather   . Breast cancer Neg Hx     SOCIAL HX:  reports that she quit smoking about 35 years ago. She has never used smokeless tobacco. She reports current alcohol use of about 1.0 standard drinks of alcohol per week. She reports that she does not use drugs.   Current Outpatient Medications:  .  albuterol (VENTOLIN HFA) 108 (90 Base) MCG/ACT inhaler, Inhale 2 puffs into the lungs every 6 (six) hours as needed for wheezing., Disp: 1 Inhaler, Rfl: 2 .  atorvastatin (LIPITOR) 20 MG tablet, Take 1 tablet by mouth once  daily, Disp: 90 tablet, Rfl: 3 .  butalbital-acetaminophen-caffeine (FIORICET) 50-325-40 MG tablet, TAKE 1 TO 2 TABLETS BY MOUTH EVERY 6 HOURS AS NEEDED FOR HEADACHE, Disp: 60 tablet, Rfl: 2 .  etodolac (LODINE) 500 MG tablet, Take 1 tablet (500 mg total) by mouth 2 (two) times daily., Disp: 60 tablet, Rfl: 0 .  levocetirizine (XYZAL ALLERGY 24HR) 5 MG tablet, , Disp: , Rfl:  .  meclizine (ANTIVERT) 25 MG tablet, TAKE 1 TABLET BY MOUTH THREE TIMES DAILY AS NEEDED, Disp: 90 tablet, Rfl: 0 .  metFORMIN (GLUCOPHAGE) 500 MG tablet, Take 2 tablets (1,000 mg total) by mouth 2 (two) times daily with a  meal., Disp: 360 tablet, Rfl: 1 .  Dulaglutide (TRULICITY) 1.82 XH/3.7JI SOPN, inject 0.75 mg into skin once weekly, Disp: 2 mL, Rfl: 2  EXAM:  VITALS per patient if applicable:  GENERAL: alert, oriented, appears well and in no acute distress  HEENT: atraumatic, conjunttiva clear, no obvious abnormalities on inspection of external nose and ears  NECK: normal movements of the head and neck  LUNGS: on inspection no signs of respiratory distress, breathing rate appears normal, no obvious gross SOB, gasping or wheezing  CV: no obvious cyanosis  MS: moves all visible extremities without noticeable abnormality  PSYCH/NEURO: pleasant and cooperative, no obvious depression or anxiety, speech and thought processing grossly intact  ASSESSMENT AND PLAN:  Discussed the following assessment and plan:  Hepatic steatosis  Mixed hyperlipidemia - Plan: Lipid panel  Type 2 diabetes mellitus with obesity (HCC) - Plan: Hemoglobin A1c, Comprehensive metabolic panel  Uncontrolled type 2 diabetes mellitus with hyperglycemia (HCC) - Plan: C-peptide  Contact with and (suspected) exposure to covid-19 - Plan: SAR CoV2 Serology (COVID 19)AB(IGG)IA  Class 1 obesity due to excess calories without serious comorbidity with body mass index (BMI) of 32.0 to 32.9 in adult  Type 2 diabetes mellitus with obesity ([place]) Agree with trial of Trulicity .  Fasting labs needed this week.  Advised to start with 0.75 mg weekly dose x 4 weeks and reduce dose of metformin to 500 mg bid.    Contact with and (suspected) exposure to covid-19 She did not have any symptoms despite caring for sick husband for multiple days without masking.  Antibody test ordered   Hepatic steatosis She has eliminated alcohol from diet due to elevated blood sugars.  Reviewed moderate consumption as allowable as long as liver enzymes are normal  Obesity I have addressed  BMI and recommended a low glycemic index diet utilizing smaller more  frequent meals to increase metabolism.  I have also recommended that patient start exercising with a goal of 30 minutes of aerobic exercise a minimum of 5 days per week.    I discussed the assessment and treatment plan with the patient. The patient was provided an opportunity to ask questions and all were answered. The patient agreed with the plan and demonstrated an understanding of the instructions.   The patient was advised to call back or seek an in-person evaluation if the symptoms worsen or if the condition fails to improve as anticipated.  I provided 30 minutes of non-face-to-face time during this encounter reviewing patient's current problems and past procedures/imaging studies, providing counseling on the above mentioned problems , and coordination  of care .  Crecencio Mc, MD

## 2019-09-07 NOTE — Assessment & Plan Note (Signed)
I have addressed  BMI and recommended a low glycemic index diet utilizing smaller more frequent meals to increase metabolism.  I have also recommended that patient start exercising with a goal of 30 minutes of aerobic exercise a minimum of 5 days per week.  

## 2019-09-08 ENCOUNTER — Other Ambulatory Visit: Payer: Self-pay

## 2019-09-09 ENCOUNTER — Other Ambulatory Visit: Payer: Self-pay

## 2019-09-09 ENCOUNTER — Other Ambulatory Visit (INDEPENDENT_AMBULATORY_CARE_PROVIDER_SITE_OTHER): Payer: BC Managed Care – PPO

## 2019-09-09 DIAGNOSIS — Z20822 Contact with and (suspected) exposure to covid-19: Secondary | ICD-10-CM | POA: Diagnosis not present

## 2019-09-09 DIAGNOSIS — E782 Mixed hyperlipidemia: Secondary | ICD-10-CM

## 2019-09-09 DIAGNOSIS — E669 Obesity, unspecified: Secondary | ICD-10-CM | POA: Diagnosis not present

## 2019-09-09 DIAGNOSIS — E119 Type 2 diabetes mellitus without complications: Secondary | ICD-10-CM

## 2019-09-09 DIAGNOSIS — E1165 Type 2 diabetes mellitus with hyperglycemia: Secondary | ICD-10-CM

## 2019-09-09 DIAGNOSIS — D508 Other iron deficiency anemias: Secondary | ICD-10-CM

## 2019-09-09 DIAGNOSIS — E1169 Type 2 diabetes mellitus with other specified complication: Secondary | ICD-10-CM | POA: Diagnosis not present

## 2019-09-09 LAB — COMPREHENSIVE METABOLIC PANEL
ALT: 109 U/L — ABNORMAL HIGH (ref 0–35)
AST: 107 U/L — ABNORMAL HIGH (ref 0–37)
Albumin: 4.7 g/dL (ref 3.5–5.2)
Alkaline Phosphatase: 166 U/L — ABNORMAL HIGH (ref 39–117)
BUN: 12 mg/dL (ref 6–23)
CO2: 27 mEq/L (ref 19–32)
Calcium: 10.2 mg/dL (ref 8.4–10.5)
Chloride: 99 mEq/L (ref 96–112)
Creatinine, Ser: 0.63 mg/dL (ref 0.40–1.20)
GFR: 96.72 mL/min (ref >60.00)
Glucose, Bld: 121 mg/dL — ABNORMAL HIGH (ref 70–99)
Potassium: 4.5 mEq/L (ref 3.5–5.1)
Sodium: 138 mEq/L (ref 135–145)
Total Bilirubin: 0.6 mg/dL (ref 0.2–1.2)
Total Protein: 8 g/dL (ref 6.0–8.3)

## 2019-09-09 LAB — LIPID PANEL
Cholesterol: 177 mg/dL (ref 0–200)
HDL: 79.8 mg/dL (ref >39.00)
LDL Cholesterol: 76 mg/dL (ref 0–99)
NonHDL: 97.54
Total CHOL/HDL Ratio: 2
Triglycerides: 108 mg/dL (ref 0.0–149.0)
VLDL: 21.6 mg/dL (ref 0.0–40.0)

## 2019-09-09 LAB — HEMOGLOBIN A1C: Hgb A1c MFr Bld: 9.4 % — ABNORMAL HIGH (ref 4.6–6.5)

## 2019-09-10 ENCOUNTER — Other Ambulatory Visit: Payer: Self-pay | Admitting: Internal Medicine

## 2019-09-10 DIAGNOSIS — K760 Fatty (change of) liver, not elsewhere classified: Secondary | ICD-10-CM

## 2019-09-10 LAB — SAR COV2 SEROLOGY (COVID19)AB(IGG),IA: SARS CoV2 AB IGG: NEGATIVE

## 2019-09-10 LAB — C-PEPTIDE: C-Peptide: 2.65 ng/mL (ref 0.80–3.85)

## 2019-09-12 ENCOUNTER — Other Ambulatory Visit: Payer: Self-pay

## 2019-09-12 ENCOUNTER — Other Ambulatory Visit: Payer: BC Managed Care – PPO

## 2019-09-12 DIAGNOSIS — E1165 Type 2 diabetes mellitus with hyperglycemia: Secondary | ICD-10-CM | POA: Diagnosis not present

## 2019-09-12 LAB — MICROALBUMIN / CREATININE URINE RATIO
Creatinine,U: 33.8 mg/dL
Microalb Creat Ratio: 2.1 mg/g (ref 0.0–30.0)
Microalb, Ur: <0.7 mg/dL (ref 0.0–1.9)

## 2019-10-26 ENCOUNTER — Other Ambulatory Visit: Payer: Self-pay | Admitting: Internal Medicine

## 2019-10-26 MED ORDER — EPINEPHRINE 0.3 MG/0.3ML IJ SOAJ: 0.3000 mg | INTRAMUSCULAR | 1 refills | Status: DC | PRN

## 2019-11-27 ENCOUNTER — Other Ambulatory Visit: Payer: Self-pay | Admitting: Internal Medicine

## 2019-11-30 ENCOUNTER — Other Ambulatory Visit: Payer: Self-pay | Admitting: Internal Medicine

## 2019-11-30 DIAGNOSIS — E1169 Type 2 diabetes mellitus with other specified complication: Secondary | ICD-10-CM

## 2019-11-30 DIAGNOSIS — E669 Obesity, unspecified: Secondary | ICD-10-CM

## 2019-11-30 MED ORDER — TRULICITY 1.5 MG/0.5ML ~~LOC~~ SOAJ: 1.5000 mg | SUBCUTANEOUS | 0 refills | Status: DC

## 2019-11-30 NOTE — Assessment & Plan Note (Signed)
Advised to increase Trulicity dose to 1.5 mg weekly for average BS of 170 for march (3 months)

## 2020-02-13 ENCOUNTER — Other Ambulatory Visit: Payer: Self-pay | Admitting: Internal Medicine

## 2020-02-13 MED ORDER — TRULICITY 1.5 MG/0.5ML ~~LOC~~ SOAJ: 1.5000 mg | SUBCUTANEOUS | 2 refills | Status: DC

## 2020-02-22 ENCOUNTER — Other Ambulatory Visit: Payer: Self-pay | Admitting: Internal Medicine

## 2020-02-22 DIAGNOSIS — Z1231 Encounter for screening mammogram for malignant neoplasm of breast: Secondary | ICD-10-CM

## 2020-04-04 IMAGING — DX DG FOOT COMPLETE 3+V*L*
3 series · 3 of 3 positions shown · non-contrast
Comparison: None.

CLINICAL DATA: Left foot pain for several days, no known injury,
initial encounter

EXAM:
LEFT FOOT - COMPLETE 3+ VIEW

[foot ap]
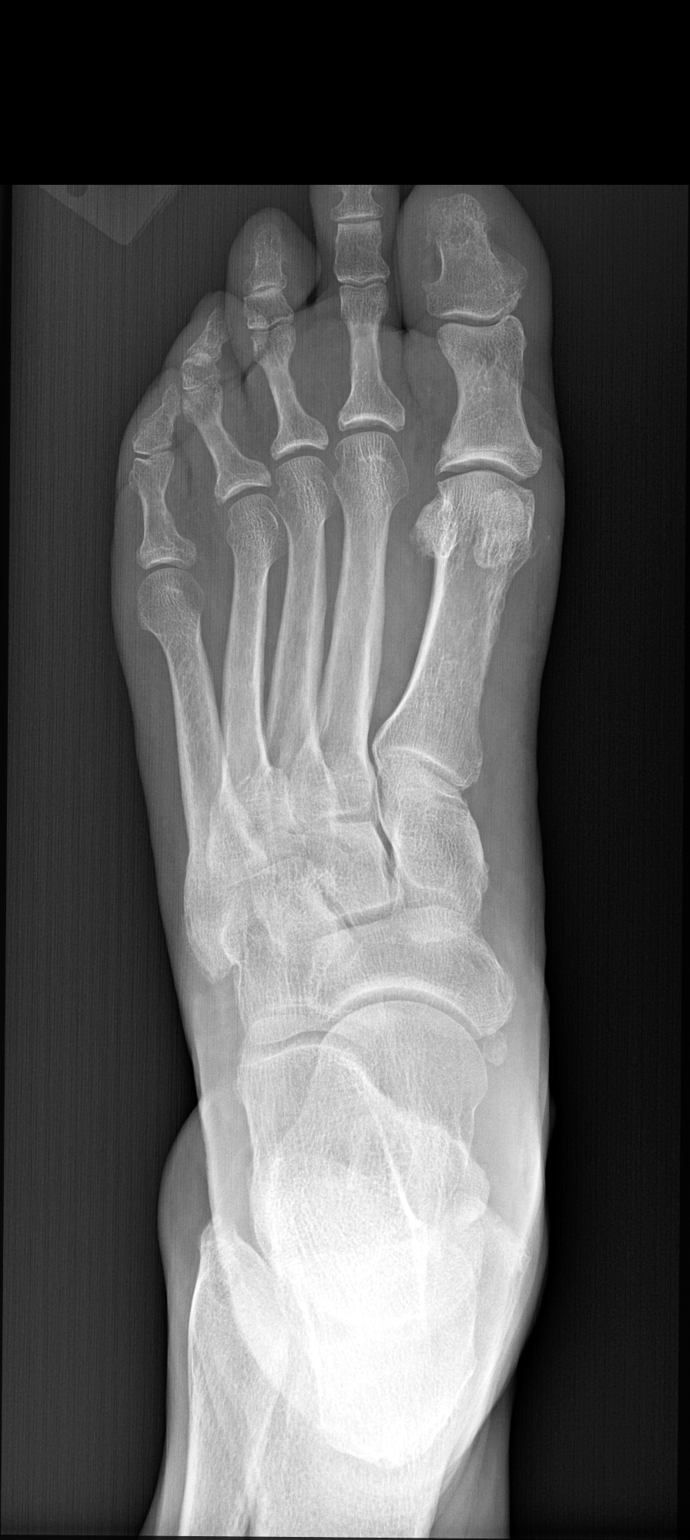

[foot obl (oblique)]
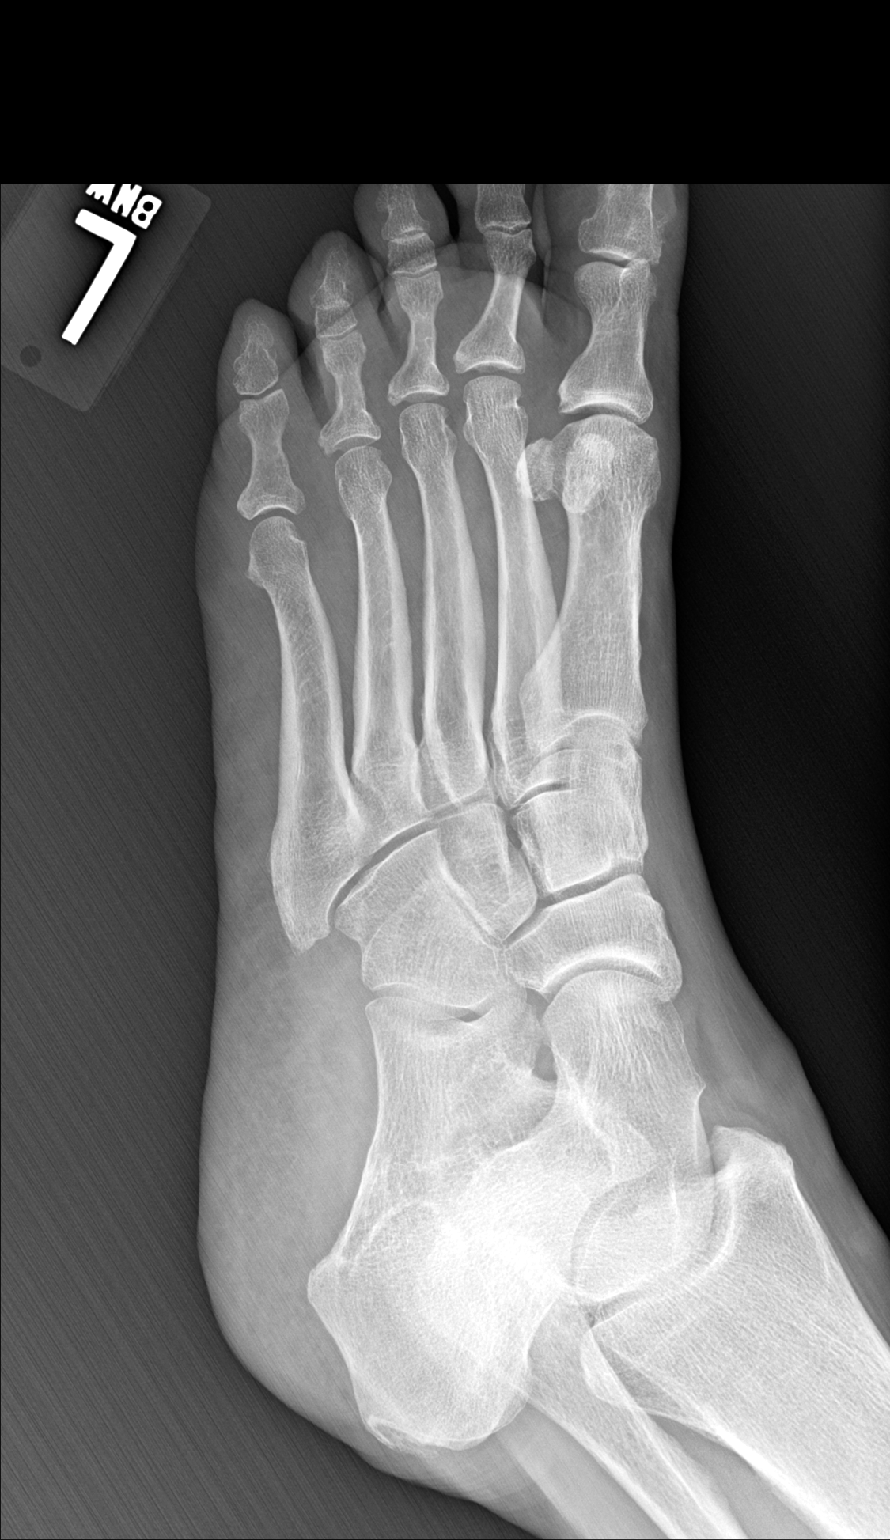

[foot lat]
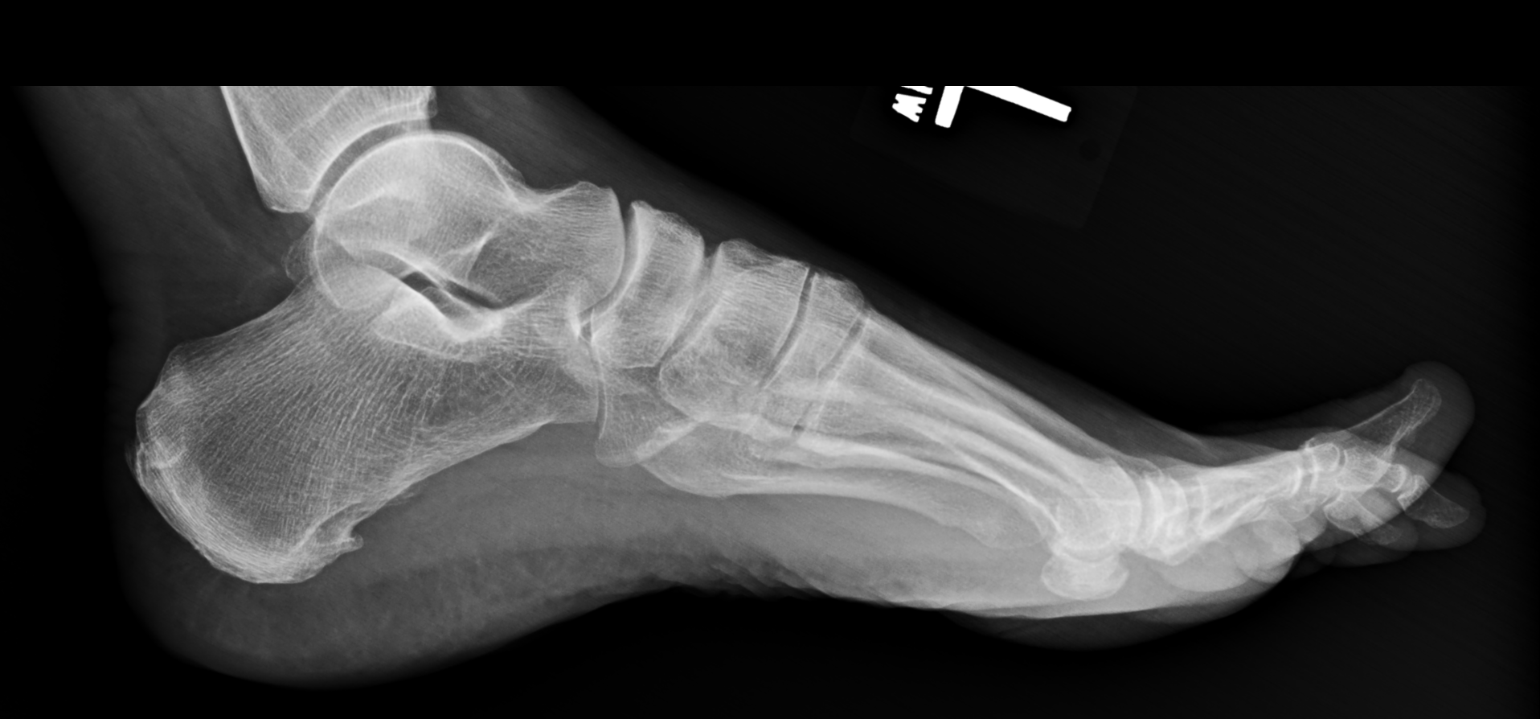

[3 of 3 positions shown; findings below may reference images not displayed]

FINDINGS: Mild degenerative changes of the first MTP joint are noted. No acute
fracture or dislocation is noted. Small calcaneal spurs are seen.
IMPRESSION: Mild degenerative change without acute abnormality.

## 2020-05-02 ENCOUNTER — Ambulatory Visit
Admission: RE | Admit: 2020-05-02 | Discharge: 2020-05-02 | Disposition: A | Discharge status: Home / Self Care | Payer: BC Managed Care – PPO | Source: Ambulatory Visit | Attending: Internal Medicine | Admitting: Internal Medicine

## 2020-05-02 ENCOUNTER — Other Ambulatory Visit: Payer: Self-pay

## 2020-05-02 DIAGNOSIS — Z1231 Encounter for screening mammogram for malignant neoplasm of breast: Secondary | ICD-10-CM | POA: Insufficient documentation

## 2020-05-20 ENCOUNTER — Other Ambulatory Visit: Payer: Self-pay | Admitting: Internal Medicine

## 2020-06-08 ENCOUNTER — Telehealth (INDEPENDENT_AMBULATORY_CARE_PROVIDER_SITE_OTHER): Payer: BC Managed Care – PPO | Admitting: Nurse Practitioner

## 2020-06-08 ENCOUNTER — Encounter: Payer: Self-pay | Admitting: Nurse Practitioner

## 2020-06-08 ENCOUNTER — Other Ambulatory Visit: Payer: Self-pay

## 2020-06-08 VITALS — Ht 67.25 in | Wt 205.0 lb

## 2020-06-08 DIAGNOSIS — J069 Acute upper respiratory infection, unspecified: Secondary | ICD-10-CM

## 2020-06-08 DIAGNOSIS — J019 Acute sinusitis, unspecified: Secondary | ICD-10-CM

## 2020-06-08 MED ORDER — AZITHROMYCIN 250 MG PO TABS: ORAL_TABLET | ORAL | 0 refills | Status: DC

## 2020-06-08 NOTE — Progress Notes (Signed)
Virtual Visit via virtual  Note  This visit type was conducted due to national recommendations for restrictions regarding the COVID-19 pandemic (e.g. social distancing).  This format is felt to be most appropriate for this patient at this time.  All issues noted in this document were discussed and addressed.  No physical exam was performed (except for noted visual exam findings with Video Visits).   I connected with@ on 06/09/20 at  4:30 PM EDT by a video enabled telemedicine application or telephone and verified that I am speaking with the correct person using two identifiers. Location patient: home Location provider: work or home office Persons participating in the virtual visit: patient, provider  I discussed the limitations, risks, security and privacy concerns of performing an evaluation and management service by telephone and the availability of in person appointments. I also discussed with the patient that there may be a patient responsible charge related to this service. The patient expressed understanding and agreed to proceed.  Reason for visit: Sinus infection symptoms with congestion sore throat and cough  HPI: This 59 year old has a history of allergic rhinitis, asthma, reports a 4-day history of nasal congestion, hoarse voice, pressure behind the eyes maxillary and teeth.  She denies sore throat, cough, fevers or chills,or  GI symptoms. No known Covid exposure.  She had the Covid vaccine with [facility] in March.  ROS: See pertinent positives and negatives per HPI.  Past Medical History:  Diagnosis Date   Asthma    Diabetes mellitus without complication (HCC)    Hyperlipidemia    Migraines     Past Surgical History:  Procedure Laterality Date   HERNIA REPAIR  1991    Family History  Problem Relation Age of Onset   Parkinsonism Mother 8   Cancer Mother        uterus   Heart failure Mother    Hypertension Father    Hyperlipidemia Father    Diabetes Father     Heart disease Father 76       CAD s/p 4 vessel CABG   Cancer Brother 12       prostate CA   Diabetes Maternal Grandmother    Stroke Maternal Grandmother    Heart disease Paternal Grandfather    COPD Paternal Grandfather    Breast cancer Neg Hx     SOCIAL HX: Former smoker   Current Outpatient Medications:    albuterol (VENTOLIN HFA) 108 (90 Base) MCG/ACT inhaler, Inhale 2 puffs into the lungs every 6 (six) hours as needed for wheezing., Disp: 1 Inhaler, Rfl: 2   atorvastatin (LIPITOR) 20 MG tablet, Take 1 tablet by mouth once daily, Disp: 90 tablet, Rfl: 0   butalbital-acetaminophen-caffeine (FIORICET) 50-325-40 MG tablet, TAKE 1 TO 2 TABLETS BY MOUTH EVERY 6 HOURS AS NEEDED FOR HEADACHE, Disp: 60 tablet, Rfl: 2   Dulaglutide (TRULICITY) 1.5 MG/0.5ML SOPN, Inject 0.5 mLs (1.5 mg total) into the skin once a week., Disp: 12 mL, Rfl: 2   EPINEPHrine 0.3 mg/0.3 mL IJ SOAJ injection, Inject 0.3 mLs (0.3 mg total) into the muscle as needed for anaphylaxis., Disp: 1 each, Rfl: 1   etodolac (LODINE) 500 MG tablet, Take 1 tablet (500 mg total) by mouth 2 (two) times daily., Disp: 60 tablet, Rfl: 0   levocetirizine (XYZAL ALLERGY 24HR) 5 MG tablet, , Disp: , Rfl:    meclizine (ANTIVERT) 25 MG tablet, TAKE 1 TABLET BY MOUTH THREE TIMES DAILY AS NEEDED, Disp: 90 tablet, Rfl: 0   metFORMIN (GLUCOPHAGE)  500 MG tablet, Take 2 tablets (1,000 mg total) by mouth 2 (two) times daily with a meal., Disp: 360 tablet, Rfl: 1   azithromycin (ZITHROMAX) 250 MG tablet, Take 2 tablets ( total 500 mg) PO on day 1, then take 1 tablet ( total 250 mg) by mouth q24h x 4 days., Disp: 6 tablet, Rfl: 0  EXAM:  VITALS per patient if applicable: Weight 205 pounds pulse oximetry 99%  GENERAL: alert, oriented, appears well and in no acute distress  HEENT: atraumatic, conjunctiva clear, no obvious abnormalities on inspection of external nose and ears  NECK: normal movements of the head and  neck  LUNGS: on inspection no signs of respiratory distress, breathing rate appears normal, no obvious gross SOB, gasping or wheezing  CV: no obvious cyanosis  MS: moves all visible extremities without noticeable abnormality  PSYCH/NEURO: pleasant and cooperative, no obvious depression or anxiety, speech and thought processing grossly intact  ASSESSMENT AND PLAN:  Discussed the following assessment and plan:  Upper respiratory tract infection, unspecified type  Acute non-recurrent sinusitis, unspecified location  No problem-specific Assessment & Plan notes found for this encounter.   Covid test- will make appt now at [facility] tent  NSS Afrin generic for 3 days  Does not like Mucinex or steroid nose sprays.   Zpak is only Abx that works for her.   Follow up Acute care if no improvement for in person exam.  I discussed the assessment and treatment plan with the patient. The patient was provided an opportunity to ask questions and all were answered. The patient agreed with the plan and demonstrated an understanding of the instructions.   The patient was advised to call back or seek an in-person evaluation if the symptoms worsen or if the condition fails to improve as anticipated. Rachel Kinsman, NP Adult Nurse Practitioner [facility] [employer] [phone]

## 2020-06-08 NOTE — Patient Instructions (Addendum)
Covid test- will make appt now at [facility] tent  NSS Afrin generic for 3 days  Does not like Mucinex or steroid nose sprays.   Zpak is only Abx that works for her.   Follow up Acute care if no improvementfor in person exam.   Upper Respiratory Infection, Adult An upper respiratory infection (URI) is a common viral infection of the nose, throat, and upper air passages that lead to the lungs. The most common type of URI is the common cold. URIs usually get better on their own, without medical treatment. What are the causes? A URI is caused by a virus. You may catch a virus by:  Breathing in droplets from an infected person's cough or sneeze.  Touching something that has been exposed to the virus (contaminated) and then touching your mouth, nose, or eyes. What increases the risk? You are more likely to get a URI if:  You are very young or very old.  It is autumn or winter.  You have close contact with others, such as at a daycare, school, or health care facility.  You smoke.  You have long-term (chronic) heart or lung disease.  You have a weakened disease-fighting (immune) system.  You have nasal allergies or asthma.  You are experiencing a lot of stress.  You work in an area that has poor air circulation.  You have poor nutrition. What are the signs or symptoms? A URI usually involves some of the following symptoms:  Runny or stuffy (congested) nose.  Sneezing.  Cough.  Sore throat.  Headache.  Fatigue.  Fever.  Loss of appetite.  Pain in your forehead, behind your eyes, and over your cheekbones (sinus pain).  Muscle aches.  Redness or irritation of the eyes.  Pressure in the ears or face. How is this diagnosed? This condition may be diagnosed based on your medical history and symptoms, and a physical exam. Your health care provider may use a cotton swab to take a mucus sample from your nose (nasal swab). This sample can be tested to determine what virus  is causing the illness. How is this treated? URIs usually get better on their own within 7-10 days. You can take steps at home to relieve your symptoms. Medicines cannot cure URIs, but your health care provider may recommend certain medicines to help relieve symptoms, such as:  Over-the-counter cold medicines.  Cough suppressants. Coughing is a type of defense against infection that helps to clear the respiratory system, so take these medicines only as recommended by your health care provider.  Fever-reducing medicines. Follow these instructions at home: Activity  Rest as needed.  If you have a fever, stay home from work or school until your fever is gone or until your health care provider says you are no longer contagious. Your health care provider may have you wear a face mask to prevent your infection from spreading. Relieving symptoms  Gargle with a salt-water mixture 3-4 times a day or as needed. To make a salt-water mixture, completely dissolve -1 tsp of salt in 1 cup of warm water.  Use a cool-mist humidifier to add moisture to the air. This can help you breathe more easily. Eating and drinking   Drink enough fluid to keep your urine pale yellow.  Eat soups and other clear broths. General instructions   Take over-the-counter and prescription medicines only as told by your health care provider. These include cold medicines, fever reducers, and cough suppressants.  Do not use any products that  contain nicotine or tobacco, such as cigarettes and e-cigarettes. If you need help quitting, ask your health care provider.  Stay away from secondhand smoke.  Stay up to date on all immunizations, including the yearly (annual) flu vaccine.  Keep all follow-up visits as told by your health care provider. This is important. How to prevent the spread of infection to others   URIs can be passed from person to person (are contagious). To prevent the infection from spreading: ? Wash  your hands often with soap and water. If soap and water are not available, use hand sanitizer. ? Avoid touching your mouth, face, eyes, or nose. ? Cough or sneeze into a tissue or your sleeve or elbow instead of into your hand or into the air. Contact a health care provider if:  You are getting worse instead of better.  You have a fever or chills.  Your mucus is brown or red.  You have yellow or brown discharge coming from your nose.  You have pain in your face, especially when you bend forward.  You have swollen neck glands.  You have pain while swallowing.  You have white areas in the back of your throat. Get help right away if:  You have shortness of breath that gets worse.  You have severe or persistent: ? Headache. ? Ear pain. ? Sinus pain. ? Chest pain.  You have chronic lung disease along with any of the following: ? Wheezing. ? Prolonged cough. ? Coughing up blood. ? A change in your usual mucus.  You have a stiff neck.  You have changes in your: ? Vision. ? Hearing. ? Thinking. ? Mood. Summary  An upper respiratory infection (URI) is a common infection of the nose, throat, and upper air passages that lead to the lungs.  A URI is caused by a virus.  URIs usually get better on their own within 7-10 days.  Medicines cannot cure URIs, but your health care provider may recommend certain medicines to help relieve symptoms. This information is not intended to replace advice given to you by your health care provider. Make sure you discuss any questions you have with your health care provider. Document Revised: 07/29/2018 Document Reviewed: 03/06/2017 Elsevier Patient Education  2020 [employer]  Sinusitis, Adult Sinusitis is inflammation of your sinuses. Sinuses are hollow spaces in the bones around your face. Your sinuses are located:  Around your eyes.  In the middle of your forehead.  Behind your nose.  In your cheekbones. Mucus normally drains  out of your sinuses. When your nasal tissues become inflamed or swollen, mucus can become trapped or blocked. This allows bacteria, viruses, and fungi to grow, which leads to infection. Most infections of the sinuses are caused by a virus. Sinusitis can develop quickly. It can last for up to 4 weeks (acute) or for more than 12 weeks (chronic). Sinusitis often develops after a cold. What are the causes? This condition is caused by anything that creates swelling in the sinuses or stops mucus from draining. This includes:  Allergies.  Asthma.  Infection from bacteria or viruses.  Deformities or blockages in your nose or sinuses.  Abnormal growths in the nose (nasal polyps).  Pollutants, such as chemicals or irritants in the air.  Infection from fungi (rare). What increases the risk? You are more likely to develop this condition if you:  Have a weak body defense system (immune system).  Do a lot of swimming or diving.  Overuse nasal sprays.  Smoke. What are the signs or symptoms? The main symptoms of this condition are pain and a feeling of pressure around the affected sinuses. Other symptoms include:  Stuffy nose or congestion.  Thick drainage from your nose.  Swelling and warmth over the affected sinuses.  Headache.  Upper toothache.  A cough that may get worse at night.  Extra mucus that collects in the throat or the back of the nose (postnasal drip).  Decreased sense of smell and taste.  Fatigue.  A fever.  Sore throat.  Bad breath. How is this diagnosed? This condition is diagnosed based on:  Your symptoms.  Your medical history.  A physical exam.  Tests to find out if your condition is acute or chronic. This may include: ? Checking your nose for nasal polyps. ? Viewing your sinuses using a device that has a light (endoscope). ? Testing for allergies or bacteria. ? Imaging tests, such as an MRI or CT scan. In rare cases, a bone biopsy may be done  to rule out more serious types of fungal sinus disease. How is this treated? Treatment for sinusitis depends on the cause and whether your condition is chronic or acute.  If caused by a virus, your symptoms should go away on their own within 10 days. You may be given medicines to relieve symptoms. They include: ? Medicines that shrink swollen nasal passages (topical intranasal decongestants). ? Medicines that treat allergies (antihistamines). ? A spray that eases inflammation of the nostrils (topical intranasal corticosteroids). ? Rinses that help get rid of thick mucus in your nose (nasal saline washes).  If caused by bacteria, your health care provider may recommend waiting to see if your symptoms improve. Most bacterial infections will get better without antibiotic medicine. You may be given antibiotics if you have: ? A severe infection. ? A weak immune system.  If caused by narrow nasal passages or nasal polyps, you may need to have surgery. Follow these instructions at home: Medicines  Take, use, or apply over-the-counter and prescription medicines only as told by your health care provider. These may include nasal sprays.  If you were prescribed an antibiotic medicine, take it as told by your health care provider. Do not stop taking the antibiotic even if you start to feel better. Hydrate and humidify   Drink enough fluid to keep your urine pale yellow. Staying hydrated will help to thin your mucus.  Use a cool mist humidifier to keep the humidity level in your home above 50%.  Inhale steam for 10-15 minutes, 3-4 times a day, or as told by your health care provider. You can do this in the bathroom while a hot shower is running.  Limit your exposure to cool or dry air. Rest  Rest as much as possible.  Sleep with your head raised (elevated).  Make sure you get enough sleep each night. General instructions   Apply a warm, moist washcloth to your face 3-4 times a day or as  told by your health care provider. This will help with discomfort.  Wash your hands often with soap and water to reduce your exposure to germs. If soap and water are not available, use hand sanitizer.  Do not smoke. Avoid being around people who are smoking (secondhand smoke).  Keep all follow-up visits as told by your health care provider. This is important. Contact a health care provider if:  You have a fever.  Your symptoms get worse.  Your symptoms do not improve within 10  days. Get help right away if:  You have a severe headache.  You have persistent vomiting.  You have severe pain or swelling around your face or eyes.  You have vision problems.  You develop confusion.  Your neck is stiff.  You have trouble breathing. Summary  Sinusitis is soreness and inflammation of your sinuses. Sinuses are hollow spaces in the bones around your face.  This condition is caused by nasal tissues that become inflamed or swollen. The swelling traps or blocks the flow of mucus. This allows bacteria, viruses, and fungi to grow, which leads to infection.  If you were prescribed an antibiotic medicine, take it as told by your health care provider. Do not stop taking the antibiotic even if you start to feel better.  Keep all follow-up visits as told by your health care provider. This is important. This information is not intended to replace advice given to you by your health care provider. Make sure you discuss any questions you have with your health care provider. Document Revised: 12/21/2017 Document Reviewed: 12/21/2017 Elsevier Patient Education  2020 [employer].

## 2020-06-09 ENCOUNTER — Encounter: Payer: Self-pay | Admitting: Nurse Practitioner

## 2020-06-09 DIAGNOSIS — J069 Acute upper respiratory infection, unspecified: Secondary | ICD-10-CM | POA: Insufficient documentation

## 2020-06-22 ENCOUNTER — Telehealth: Payer: Self-pay | Admitting: Internal Medicine

## 2020-06-25 NOTE — Telephone Encounter (Signed)
E-mail sent to schedule DM follow up

## 2020-08-04 DIAGNOSIS — K760 Fatty (change of) liver, not elsewhere classified: Secondary | ICD-10-CM

## 2020-08-04 HISTORY — DX: Fatty (change of) liver, not elsewhere classified: K76.0

## 2020-08-07 ENCOUNTER — Other Ambulatory Visit: Payer: Self-pay | Admitting: Internal Medicine

## 2020-08-07 MED ORDER — OZEMPIC (0.25 OR 0.5 MG/DOSE) 2 MG/1.5ML ~~LOC~~ SOPN: 0.5000 mg | PEN_INJECTOR | SUBCUTANEOUS | 11 refills | Status: DC

## 2020-08-23 ENCOUNTER — Other Ambulatory Visit: Payer: Self-pay | Admitting: Internal Medicine

## 2020-11-04 DIAGNOSIS — E1169 Type 2 diabetes mellitus with other specified complication: Secondary | ICD-10-CM

## 2020-11-04 DIAGNOSIS — E669 Obesity, unspecified: Secondary | ICD-10-CM

## 2020-11-04 DIAGNOSIS — K760 Fatty (change of) liver, not elsewhere classified: Secondary | ICD-10-CM

## 2020-11-05 NOTE — Telephone Encounter (Signed)
Patient not had any labs since 2/21 ok to fill Metformin medication pended with correct pharmacy.

## 2020-11-06 MED ORDER — METFORMIN HCL 500 MG PO TABS: 1000.0000 mg | ORAL_TABLET | Freq: Two times a day (BID) | ORAL | 1 refills | Status: DC

## 2020-11-08 ENCOUNTER — Other Ambulatory Visit: Payer: Self-pay | Admitting: Internal Medicine

## 2020-11-08 NOTE — Telephone Encounter (Signed)
Patient scheduled Labs.

## 2020-11-09 ENCOUNTER — Other Ambulatory Visit: Payer: Self-pay

## 2020-11-09 ENCOUNTER — Other Ambulatory Visit (INDEPENDENT_AMBULATORY_CARE_PROVIDER_SITE_OTHER): Payer: BC Managed Care – PPO

## 2020-11-09 DIAGNOSIS — E669 Obesity, unspecified: Secondary | ICD-10-CM

## 2020-11-09 DIAGNOSIS — K760 Fatty (change of) liver, not elsewhere classified: Secondary | ICD-10-CM | POA: Diagnosis not present

## 2020-11-09 DIAGNOSIS — E1169 Type 2 diabetes mellitus with other specified complication: Secondary | ICD-10-CM

## 2020-11-09 LAB — COMPREHENSIVE METABOLIC PANEL
ALT: 35 U/L (ref 0–35)
AST: 30 U/L (ref 0–37)
Albumin: 4.3 g/dL (ref 3.5–5.2)
Alkaline Phosphatase: 135 U/L — ABNORMAL HIGH (ref 39–117)
BUN: 20 mg/dL (ref 6–23)
CO2: 26 mEq/L (ref 19–32)
Calcium: 9.6 mg/dL (ref 8.4–10.5)
Chloride: 102 mEq/L (ref 96–112)
Creatinine, Ser: 0.73 mg/dL (ref 0.40–1.20)
GFR: 89.55 mL/min (ref >60.00)
Glucose, Bld: 113 mg/dL — ABNORMAL HIGH (ref 70–99)
Potassium: 4.3 mEq/L (ref 3.5–5.1)
Sodium: 136 mEq/L (ref 135–145)
Total Bilirubin: 1.6 mg/dL — ABNORMAL HIGH (ref 0.2–1.2)
Total Protein: 7.4 g/dL (ref 6.0–8.3)

## 2020-11-09 LAB — LIPID PANEL
Cholesterol: 167 mg/dL (ref 0–200)
HDL: 70.2 mg/dL (ref >39.00)
LDL Cholesterol: 80 mg/dL (ref 0–99)
NonHDL: 96.43
Total CHOL/HDL Ratio: 2
Triglycerides: 80 mg/dL (ref 0.0–149.0)
VLDL: 16 mg/dL (ref 0.0–40.0)

## 2020-11-09 LAB — MICROALBUMIN / CREATININE URINE RATIO
Creatinine,U: 82.7 mg/dL
Microalb Creat Ratio: 0.8 mg/g (ref 0.0–30.0)
Microalb, Ur: <0.7 mg/dL (ref 0.0–1.9)

## 2020-11-09 LAB — HEMOGLOBIN A1C: Hgb A1c MFr Bld: 6.3 % (ref 4.6–6.5)

## 2020-11-11 ENCOUNTER — Other Ambulatory Visit: Payer: Self-pay | Admitting: Internal Medicine

## 2020-11-11 DIAGNOSIS — R748 Abnormal levels of other serum enzymes: Secondary | ICD-10-CM

## 2020-11-11 DIAGNOSIS — K760 Fatty (change of) liver, not elsewhere classified: Secondary | ICD-10-CM

## 2020-11-11 MED ORDER — ETODOLAC 500 MG PO TABS: 500.0000 mg | ORAL_TABLET | Freq: Two times a day (BID) | ORAL | 0 refills | Status: DC

## 2020-11-11 MED ORDER — OZEMPIC (0.25 OR 0.5 MG/DOSE) 2 MG/1.5ML ~~LOC~~ SOPN: 0.5000 mg | PEN_INJECTOR | SUBCUTANEOUS | 11 refills | Status: DC

## 2020-11-11 MED ORDER — ATORVASTATIN CALCIUM 20 MG PO TABS: 1.0000 | ORAL_TABLET | Freq: Every day | ORAL | 0 refills | Status: DC

## 2020-11-11 NOTE — Progress Notes (Signed)
Elnita Maxwell,  Your labs look fine) (WAY TO GO ON THE A1C!)  except for the liver , which is a new pattern of ENZYME ELEVATION  for you.  It may just be the fatty liver,  but I  have ordered a liver ultrasound to rule out bile duct obstruction and cirrhosis.  Please schedule a follow up office visit as soon as it is convenient   I have refilled the etodolac, Saxenda, and atorvastatin.  Regards,   Duncan Dull, MD

## 2020-12-05 ENCOUNTER — Other Ambulatory Visit: Payer: Self-pay

## 2020-12-05 ENCOUNTER — Ambulatory Visit
Admission: RE | Admit: 2020-12-05 | Discharge: 2020-12-05 | Disposition: A | Discharge status: Home / Self Care | Payer: BC Managed Care – PPO | Source: Ambulatory Visit | Attending: Internal Medicine | Admitting: Internal Medicine

## 2020-12-05 DIAGNOSIS — R748 Abnormal levels of other serum enzymes: Secondary | ICD-10-CM | POA: Diagnosis not present

## 2020-12-05 DIAGNOSIS — K760 Fatty (change of) liver, not elsewhere classified: Secondary | ICD-10-CM | POA: Diagnosis not present

## 2020-12-19 ENCOUNTER — Ambulatory Visit: Payer: BC Managed Care – PPO

## 2020-12-19 DIAGNOSIS — E119 Type 2 diabetes mellitus without complications: Secondary | ICD-10-CM | POA: Diagnosis not present

## 2020-12-19 LAB — HM DIABETES EYE EXAM

## 2020-12-28 ENCOUNTER — Other Ambulatory Visit: Payer: Self-pay | Admitting: Internal Medicine

## 2020-12-28 ENCOUNTER — Telehealth: Payer: Self-pay | Admitting: Internal Medicine

## 2020-12-28 MED ORDER — TRIAMCINOLONE ACETONIDE 0.1 % EX CREA: 1.0000 "application " | TOPICAL_CREAM | Freq: Two times a day (BID) | CUTANEOUS | 0 refills | Status: DC

## 2020-12-28 NOTE — Telephone Encounter (Signed)
Spoke with pt to let her know that Dr. Darrick Huntsman would like for her to try the triamcinolone cream because if that doesn't help then Dr. Darrick Huntsman said it might not even by poison ivy. Pt gave a verbal understanding.

## 2020-12-28 NOTE — Telephone Encounter (Signed)
PT wants to knows if she can be prescribed a steriod for her allergic reaction to poison ivy as she knows we will be close till next Tuesday.

## 2020-12-31 ENCOUNTER — Ambulatory Visit
Admission: RE | Admit: 2020-12-31 | Discharge: 2020-12-31 | Disposition: A | Discharge status: Home / Self Care | Payer: BC Managed Care – PPO | Source: Ambulatory Visit | Attending: Emergency Medicine | Admitting: Emergency Medicine

## 2020-12-31 ENCOUNTER — Other Ambulatory Visit: Payer: Self-pay

## 2020-12-31 VITALS — BP 134/92 | HR 95 | Temp 98.8°F | Resp 17

## 2020-12-31 DIAGNOSIS — L237 Allergic contact dermatitis due to plants, except food: Secondary | ICD-10-CM | POA: Diagnosis not present

## 2020-12-31 MED ORDER — PREDNISONE 10 MG (21) PO TBPK: ORAL_TABLET | Freq: Every day | ORAL | 0 refills | Status: DC

## 2020-12-31 NOTE — ED Triage Notes (Signed)
Patient c/o rash x 7 days.   Patient endorses itchiness and "pussiness at times".   Patient endorses rash is present on bilateral upper extremity, RT breast, and ABD.   Patient endorses itchiness worsens at night.   Patient has used Triamcinolone cream and Benadryl itch cream with no relief of symptoms.

## 2020-12-31 NOTE — Discharge Instructions (Signed)
Take the prednisone as directed.    Take Benadryl every 6 hours as directed; do not drive, operate machinery, or drink alcohol with this medication as it may cause drowsiness.  If you need to be awake and alert, take Claritin or Allegra as directed.    Follow up with your primary care provider if your symptoms are not improving.       

## 2020-12-31 NOTE — ED Provider Notes (Signed)
[name]    CSN: [id number] Arrival date & time: 12/31/20  1411      History   Chief Complaint Chief Complaint  Patient presents with  . Rash    HPI Rachel Riggs is a 60 y.o. female.   Patient presents with pruritic rash on her arms and trunk x1 week.  The rash started on her right upper arm and has spread to both arms and now her trunk.  She attributes this to poison ivy.  She has been treating this at home with triamcinolone cream and Benadryl cream.  She denies fever, chills, or other symptoms.  Her medical history includes diabetes, asthma, migraine headaches, obesity.  The history is provided by the patient and medical records.    Past Medical History:  Diagnosis Date  . Asthma   . Diabetes mellitus without complication (HCC)   . Fatty liver 2022  . Hyperlipidemia   . Migraines     Patient Active Problem List   Diagnosis Date Noted  . Upper respiratory tract infection 06/09/2020  . Post-menopausal bleeding 02/22/2019  . Contact with and (suspected) exposure to covid-19 02/22/2019  . Aspirin contraindicated 08/05/2016  . Syncope and collapse 11/15/2015  . Generalized anxiety disorder 06/26/2015  . Anemia, iron deficiency 09/14/2014  . Hives of unknown origin 09/11/2014  . Hepatic steatosis 12/22/2013  . Leukocytosis, unspecified 12/22/2013  . Type 2 diabetes mellitus with obesity (HCC) 12/21/2013  . Acute non-recurrent sinusitis 01/10/2013  . Encounter for preventive health examination 10/13/2012  . Screening for cervical cancer 10/06/2012  . Allergic rhinitis 09/08/2012  . History of asthma 09/07/2012  . Hyperlipidemia 09/07/2012  . Obesity 09/07/2012  . Family history of cervical cancer 09/07/2012    Past Surgical History:  Procedure Laterality Date  . HERNIA REPAIR  1991    OB History   No obstetric history on file.      Home Medications    Prior to Admission medications   Medication Sig Start Date End Date Taking?  Authorizing Provider  atorvastatin (LIPITOR) 20 MG tablet Take 1 tablet (20 mg total) by mouth daily. 11/11/20  Yes Sherlene Shams, MD  levocetirizine Elita Boone) 5 MG tablet  12/02/17  Yes [provider]  metFORMIN (GLUCOPHAGE) 500 MG tablet Take 2 tablets (1,000 mg total) by mouth 2 (two) times daily with a meal. 11/06/20  Yes Sherlene Shams, MD  predniSONE (STERAPRED UNI-PAK 21 TAB) 10 MG (21) TBPK tablet Take by mouth daily. As directed 12/31/20  Yes Mickie Bail, NP  Semaglutide,0.25 or 0.5MG /DOS, (OZEMPIC, 0.25 OR 0.5 MG/DOSE,) 2 MG/1.5ML SOPN Inject 0.5 mg into the skin once a week. 11/11/20  Yes Sherlene Shams, MD  triamcinolone cream (KENALOG) 0.1 % Apply 1 application topically 2 (two) times daily. To rash until resolved. 12/28/20  Yes Sherlene Shams, MD  butalbital-acetaminophen-caffeine (FIORICET) 50-325-40 MG tablet TAKE 1 TO 2 TABLETS BY MOUTH EVERY 6 HOURS AS NEEDED FOR HEADACHE 05/16/19   Sherlene Shams, MD  EPINEPHrine 0.3 mg/0.3 mL IJ SOAJ injection Inject 0.3 mLs (0.3 mg total) into the muscle as needed for anaphylaxis. 10/26/19   Sherlene Shams, MD  etodolac (LODINE) 500 MG tablet Take 1 tablet (500 mg total) by mouth 2 (two) times daily. 11/11/20   Sherlene Shams, MD  meclizine (ANTIVERT) 25 MG tablet TAKE 1 TABLET BY MOUTH THREE TIMES DAILY AS NEEDED 11/16/17   Sherlene Shams, MD  VENTOLIN HFA 108 [phone] Base) MCG/ACT inhaler  INHALE 2 PUFFS BY MOUTH EVERY 6 HOURS AS NEEDED FOR WHEEZING 11/09/20   Sherlene Shams, MD    Family History Family History  Problem Relation Age of Onset  . Parkinsonism Mother 78  . Cancer Mother        uterus  . Heart failure Mother   . Hypertension Father   . Hyperlipidemia Father   . Diabetes Father   . Heart disease Father 55       CAD s/p 4 vessel CABG  . Cancer Brother 69       prostate CA  . Diabetes Maternal Grandmother   . Stroke Maternal Grandmother   . Heart disease Paternal Grandfather   . COPD Paternal Grandfather   . Breast  cancer Neg Hx     Social History Social History   Tobacco Use  . Smoking status: Former Smoker    Quit date: 08/04/1984    Years since quitting: 36.4  . Smokeless tobacco: Never Used  Substance Use Topics  . Alcohol use: Yes    Alcohol/week: 1.0 standard drink    Types: 1 Glasses of wine per week  . Drug use: No     Allergies   Amoxicillin, Aspirin, Ibuprofen, Penicillins, and Sulfa antibiotics   Review of Systems Review of Systems  Constitutional: Negative for chills and fever.  Respiratory: Negative for cough and shortness of breath.   Cardiovascular: Negative for chest pain and palpitations.  Gastrointestinal: Negative for abdominal pain and vomiting.  Skin: Positive for rash. Negative for color change.  All other systems reviewed and are negative.    Physical Exam Triage Vital Signs ED Triage Vitals  Enc Vitals Group     BP 12/31/20 1424 (!) 134/92     Pulse Rate 12/31/20 1424 95     Resp 12/31/20 1424 17     Temp 12/31/20 1424 98.8 F (37.1 C)     Temp Source 12/31/20 1424 Oral     SpO2 12/31/20 1424 96 %     Weight --      Height --      Head Circumference --      Peak Flow --      Pain Score 12/31/20 1420 0     Pain Loc --      Pain Edu? --      Excl. in GC? --    No data found.  Updated Vital Signs BP (!) 134/92 (BP Location: Left Arm)   Pulse 95   Temp 98.8 F (37.1 C) (Oral)   Resp 17   LMP 01/03/2018   SpO2 96%   Visual Acuity Right Eye Distance:   Left Eye Distance:   Bilateral Distance:    Right Eye Near:   Left Eye Near:    Bilateral Near:     Physical Exam Vitals and nursing note reviewed.  Constitutional:      General: She is not in acute distress.    Appearance: She is well-developed. She is not ill-appearing.  HENT:     Head: Normocephalic and atraumatic.     Mouth/Throat:     Mouth: Mucous membranes are moist.  Eyes:     Conjunctiva/sclera: Conjunctivae normal.  Cardiovascular:     Rate and Rhythm: Normal rate and  regular rhythm.     Heart sounds: Normal heart sounds.  Pulmonary:     Effort: Pulmonary effort is normal. No respiratory distress.     Breath sounds: Normal breath sounds.  Abdominal:  Palpations: Abdomen is soft.     Tenderness: There is no abdominal tenderness.  Musculoskeletal:        General: Normal range of motion.     Cervical back: Neck supple.  Skin:    General: Skin is warm and dry.     Findings: Rash present.     Comments: Erythematous papular rash in linear pattern on upper extremities and trunk.  Neurological:     General: No focal deficit present.     Mental Status: She is alert and oriented to person, place, and time.     Gait: Gait normal.  Psychiatric:        Mood and Affect: Mood normal.        Behavior: Behavior normal.      UC Treatments / Results  Labs (all labs ordered are listed, but only abnormal results are displayed) Labs Reviewed - No data to display  EKG   Radiology No results found.  Procedures Procedures (including critical care time)  Medications Ordered in UC Medications - No data to display  Initial Impression / Assessment and Plan / UC Course  I have reviewed the triage vital signs and the nursing notes.  Pertinent labs & imaging results that were available during my care of the patient were reviewed by me and considered in my medical decision making (see chart for details).   Poison ivy dermatitis.  Treating with prednisone taper; Discussed with patient that this medication may increase her blood sugar and that she should monitor it closely while taking the prednisone.  Also instructed her to take Benadryl every 6 hours.  Precautions for drowsiness with this medication discussed.  Instructed her to follow-up with her PCP if her symptoms are not improving.  She agrees to plan of care.   Final Clinical Impressions(s) / UC Diagnoses   Final diagnoses:  Allergic dermatitis due to poison ivy     Discharge Instructions      Take the prednisone as directed.    Take Benadryl every 6 hours as directed; do not drive, operate machinery, or drink alcohol with this medication as it may cause drowsiness.  If you need to be awake and alert, take Claritin or Allegra as directed.    Follow up with your primary care provider if your symptoms are not improving.        ED Prescriptions    Medication Sig Dispense Auth. Provider   predniSONE (STERAPRED UNI-PAK 21 TAB) 10 MG (21) TBPK tablet Take by mouth daily. As directed 21 tablet Mickie Bail, NP     PDMP not reviewed this encounter.   Mickie Bail, NP 12/31/20 1446

## 2021-01-02 ENCOUNTER — Other Ambulatory Visit: Payer: Self-pay

## 2021-01-02 ENCOUNTER — Ambulatory Visit (INDEPENDENT_AMBULATORY_CARE_PROVIDER_SITE_OTHER): Payer: BC Managed Care – PPO | Admitting: *Deleted

## 2021-01-02 DIAGNOSIS — Z23 Encounter for immunization: Secondary | ICD-10-CM

## 2021-02-18 ENCOUNTER — Other Ambulatory Visit: Payer: Self-pay

## 2021-02-18 ENCOUNTER — Telehealth: Payer: Self-pay

## 2021-02-18 MED ORDER — ATORVASTATIN CALCIUM 20 MG PO TABS: 20.0000 mg | ORAL_TABLET | Freq: Every day | ORAL | 0 refills | Status: DC

## 2021-02-18 NOTE — Telephone Encounter (Signed)
Pt needs atorvastatin (LIPITOR) 20 MG tablet sent to [facility]-please take out other pharm from chart

## 2021-02-18 NOTE — Telephone Encounter (Signed)
Lipitor has been refilled and sent to [facility].

## 2021-03-05 ENCOUNTER — Other Ambulatory Visit: Payer: Self-pay

## 2021-03-05 ENCOUNTER — Ambulatory Visit (INDEPENDENT_AMBULATORY_CARE_PROVIDER_SITE_OTHER): Payer: BC Managed Care – PPO

## 2021-03-05 DIAGNOSIS — Z23 Encounter for immunization: Secondary | ICD-10-CM

## 2021-03-05 NOTE — Progress Notes (Signed)
Patient Presented for a Hepatitis A/B immunization to Left Deltoid. Pt voiced no concerns or discomfort during or after the injection.

## 2021-03-12 ENCOUNTER — Telehealth: Payer: BC Managed Care – PPO | Admitting: Physician Assistant

## 2021-03-12 ENCOUNTER — Encounter: Payer: BC Managed Care – PPO | Admitting: Nurse Practitioner

## 2021-03-12 DIAGNOSIS — J069 Acute upper respiratory infection, unspecified: Secondary | ICD-10-CM

## 2021-03-12 DIAGNOSIS — J029 Acute pharyngitis, unspecified: Secondary | ICD-10-CM

## 2021-03-12 MED ORDER — BENZONATATE 100 MG PO CAPS: 100.0000 mg | ORAL_CAPSULE | Freq: Three times a day (TID) | ORAL | 0 refills | Status: DC | PRN

## 2021-03-12 MED ORDER — AZITHROMYCIN 250 MG PO TABS: ORAL_TABLET | ORAL | 0 refills | Status: AC

## 2021-03-12 NOTE — Progress Notes (Signed)
We are sorry that you are not feeling well.  Here is how we plan to help!  First I recommend that you get rechecked for COVID just to be cautious as you can get a falsely negative test if testing too soon after symptom onset. This is just to be careful giving your constellation of symptoms. Giving new onset of fever and thick productive cough, I am going to start antibiotics in case of a bacterial bronchitis/URI. I have sent in a script for Azithromycin (Z-pack) to take as directed.     In addition you may use A prescription cough medication called Tessalon Perles 100mg . You may take 1-2 capsules every 8 hours as needed for your cough.   From your responses in the eVisit questionnaire you describe inflammation in the upper respiratory tract which is causing a significant cough.  This is commonly called Bronchitis and has four common causes:   Allergies Viral Infections Acid Reflux Bacterial Infection Allergies, viruses and acid reflux are treated by controlling symptoms or eliminating the cause. An example might be a cough caused by taking certain blood pressure medications. You stop the cough by changing the medication. Another example might be a cough caused by acid reflux. Controlling the reflux helps control the cough.  USE OF BRONCHODILATOR ("RESCUE") INHALERS: There is a risk from using your bronchodilator too frequently.  The risk is that over-reliance on a medication which only relaxes the muscles surrounding the breathing tubes can reduce the effectiveness of medications prescribed to reduce swelling and congestion of the tubes themselves.  Although you feel brief relief from the bronchodilator inhaler, your asthma may actually be worsening with the tubes becoming more swollen and filled with mucus.  This can delay other crucial treatments, such as oral steroid medications. If you need to use a bronchodilator inhaler daily, several times per day, you should discuss this with your provider.   There are probably better treatments that could be used to keep your asthma under control.     HOME CARE Only take medications as instructed by your medical team. Complete the entire course of an antibiotic. Drink plenty of fluids and get plenty of rest. Avoid close contacts especially the very young and the elderly Cover your mouth if you cough or cough into your sleeve. Always remember to wash your hands A steam or ultrasonic humidifier can help congestion.   GET HELP RIGHT AWAY IF: You develop worsening fever. You become short of breath You cough up blood. Your symptoms persist after you have completed your treatment plan MAKE SURE YOU  Understand these instructions. Will watch your condition. Will get help right away if you are not doing well or get worse.    Thank you for choosing an e-visit.  Your e-visit answers were reviewed by a board certified advanced clinical practitioner to complete your personal care plan. Depending upon the condition, your plan could have included both over the counter or prescription medications.  Please review your pharmacy choice. Make sure the pharmacy is open so you can pick up prescription now. If there is a problem, you may contact your provider through and have the prescription routed to another pharmacy.  Your safety is important to [employer]. If you have drug allergies check your prescription carefully.   For the next 24 hours you can use MyChart to ask questions about today's visit, request a non-urgent call back, or ask for a work or school excuse. You will get an email in the next  two days asking about your experience. I hope that your e-visit has been valuable and will speed your recovery.  

## 2021-03-12 NOTE — Progress Notes (Signed)
No show

## 2021-03-12 NOTE — Progress Notes (Signed)
I have spent 5 minutes in review of e-visit questionnaire, review and updating patient chart, medical decision making and response to patient.   Avarae Zwart Cody Lore Polka, PA-C    

## 2021-03-28 ENCOUNTER — Ambulatory Visit (INDEPENDENT_AMBULATORY_CARE_PROVIDER_SITE_OTHER): Payer: BC Managed Care – PPO | Admitting: Internal Medicine

## 2021-03-28 ENCOUNTER — Other Ambulatory Visit: Payer: Self-pay

## 2021-03-28 ENCOUNTER — Encounter: Payer: Self-pay | Admitting: Internal Medicine

## 2021-03-28 VITALS — BP 130/78 | HR 81 | Ht 67.24 in | Wt 230.0 lb

## 2021-03-28 DIAGNOSIS — L509 Urticaria, unspecified: Secondary | ICD-10-CM

## 2021-03-28 DIAGNOSIS — E1169 Type 2 diabetes mellitus with other specified complication: Secondary | ICD-10-CM

## 2021-03-28 DIAGNOSIS — H6502 Acute serous otitis media, left ear: Secondary | ICD-10-CM

## 2021-03-28 DIAGNOSIS — K760 Fatty (change of) liver, not elsewhere classified: Secondary | ICD-10-CM | POA: Diagnosis not present

## 2021-03-28 DIAGNOSIS — Z0000 Encounter for general adult medical examination without abnormal findings: Secondary | ICD-10-CM

## 2021-03-28 DIAGNOSIS — E782 Mixed hyperlipidemia: Secondary | ICD-10-CM

## 2021-03-28 DIAGNOSIS — E669 Obesity, unspecified: Secondary | ICD-10-CM

## 2021-03-28 DIAGNOSIS — Z1211 Encounter for screening for malignant neoplasm of colon: Secondary | ICD-10-CM | POA: Diagnosis not present

## 2021-03-28 DIAGNOSIS — Z1231 Encounter for screening mammogram for malignant neoplasm of breast: Secondary | ICD-10-CM

## 2021-03-28 DIAGNOSIS — Z20822 Contact with and (suspected) exposure to covid-19: Secondary | ICD-10-CM

## 2021-03-28 MED ORDER — OZEMPIC (1 MG/DOSE) 4 MG/3ML ~~LOC~~ SOPN: 1.0000 mg | PEN_INJECTOR | SUBCUTANEOUS | 2 refills | Status: DC

## 2021-03-28 MED ORDER — FAMOTIDINE 20 MG PO TABS
20.0000 mg | ORAL_TABLET | Freq: Two times a day (BID) | ORAL | 11 refills | Status: DC
Start: 2021-03-28 — End: 2022-10-07

## 2021-03-28 MED ORDER — PREDNISONE 10 MG PO TABS: ORAL_TABLET | ORAL | 0 refills | Status: DC

## 2021-03-28 MED ORDER — LEVOFLOXACIN 500 MG PO TABS: 500.0000 mg | ORAL_TABLET | Freq: Every day | ORAL | 0 refills | Status: AC

## 2021-03-28 MED ORDER — MONTELUKAST SODIUM 10 MG PO TABS: 10.0000 mg | ORAL_TABLET | Freq: Every day | ORAL | 3 refills | Status: DC

## 2021-03-28 NOTE — Patient Instructions (Addendum)
I have increased the ozempic dose to 1 mg weekly to help with the weight.  If you do not tolerate the higher dose let me know ,  we can still get the 0. 5 mg dose  from the 1 mg pen by reducing the clicks    Your annual mammogram has been ordered.  You are encouraged (required) to call to make your appointment at [place]  [phone]   COLONOSCOPY REFERRAL IN PROGRESS   Continue zyzal, Singulair added,  and famotidine added for HIVES   REPEAT LABS IN October

## 2021-03-28 NOTE — Progress Notes (Signed)
Patient ID: Rachel Riggs, female    DOB: 07/25/1961  Age: 60 y.o. MRN: [id number]  The patient is here for annual preventive examination and management of other chronic and acute problems.  This visit occurred during the SARS-CoV-2 public health emergency.  Safety protocols were in place, including screening questions prior to the visit, additional usage of staff PPE, and extensive cleaning of exam room while observing appropriate contact time as indicated for disinfecting solutions.     The risk factors are reflected in the social history.  The roster of all physicians providing medical care to patient - is listed in the Snapshot section of the chart.  Activities of daily living:  The patient is 100% independent in all ADLs: dressing, toileting, feeding as well as independent mobility  Home safety : The patient has smoke detectors in the home. They wear seatbelts.  There are no firearms at home. There is no violence in the home.   There is no risks for hepatitis, STDs or HIV. There is no   history of blood transfusion. They have no travel history to infectious disease endemic areas of the world.  The patient has seen their dentist in the last six month. They have seen their eye doctor in the last year. She denies hearing difficulty with regard to whispered voices and some television programs.  They have deferred audiologic testing in the last year.  They do not  have excessive sun exposure. Discussed the need for sun protection: hats, long sleeves and use of sunscreen if there is significant sun exposure.   Diet: the importance of a healthy diet is discussed. They do have a healthy diet.  The benefits of regular aerobic exercise were discussed. She walks 4 times per week ,  20 minutes.   Depression screen: there are no signs or vegative symptoms of depression- irritability, change in appetite, anhedonia, sadness/tearfullness.   The following portions of the patient's history were reviewed  and updated as appropriate: allergies, current medications, past family history, past medical history,  past surgical history, past social history  and problem list.  Visual acuity was not assessed per patient preference since she has regular follow up with her ophthalmologist. Hearing and body mass index were assessed and reviewed.   During the course of the visit the patient was educated and counseled about appropriate screening and preventive services including : fall prevention , diabetes screening, nutrition counseling, colorectal cancer screening, and recommended immunizations.    CC: The primary encounter diagnosis was Encounter for preventive health examination. Diagnoses of Colon cancer screening, Encounter for screening mammogram for malignant neoplasm of breast, Hepatic steatosis, Type 2 diabetes mellitus with obesity (HCC), Hives of unknown origin, Contact with and (suspected) exposure to covid-19, Mixed hyperlipidemia, and Acute serous otitis media of left ear, recurrence not specified were also pertinent to this visit.  1)    Recently treated for sinusitis,/bronchitis on August 9 for 5 days with azithromycin.  persistent pain and pressure in right ear and at base of jaw.  No longer having fevers  2) obesity , fatty liver  type 2 DM   taking ozempic 0.  5 mg  has had no weight  loss.  Discussed going up on dose.   History Rachel Riggs has a past medical history of Asthma, Diabetes mellitus without complication (HCC), Fatty liver (2022), Hyperlipidemia, and Migraines.   She has a past surgical history that includes Hernia repair (1991).   Her family history includes COPD in her paternal  grandfather; Cancer in her mother; Cancer (age of onset: 5853) in her brother; Diabetes in her father and maternal grandmother; Heart disease in her paternal grandfather; Heart disease (age of onset: 6073) in her father; Heart failure in her mother; Hyperlipidemia in her father; Hypertension in her father;  Parkinsonism (age of onset: 6750) in her mother; Stroke in her maternal grandmother.She reports that she quit smoking about 36 years ago. Her smoking use included cigarettes. She has never used smokeless tobacco. She reports current alcohol use of about 1.0 standard drink per week. She reports that she does not use drugs.  Outpatient Medications Prior to Visit  Medication Sig Dispense Refill   atorvastatin (LIPITOR) 20 MG tablet Take 1 tablet (20 mg total) by mouth daily. 90 tablet 0   benzonatate (TESSALON) 100 MG capsule Take 1 capsule (100 mg total) by mouth 3 (three) times daily as needed for cough. 30 capsule 0   butalbital-acetaminophen-caffeine (FIORICET) 50-325-40 MG tablet TAKE 1 TO 2 TABLETS BY MOUTH EVERY 6 HOURS AS NEEDED FOR HEADACHE 60 tablet 2   EPINEPHrine 0.3 mg/0.3 mL IJ SOAJ injection Inject 0.3 mLs (0.3 mg total) into the muscle as needed for anaphylaxis. 1 each 1   etodolac (LODINE) 500 MG tablet Take 1 tablet (500 mg total) by mouth 2 (two) times daily. 60 tablet 0   levocetirizine (XYZAL) 5 MG tablet      meclizine (ANTIVERT) 25 MG tablet TAKE 1 TABLET BY MOUTH THREE TIMES DAILY AS NEEDED 90 tablet 0   metFORMIN (GLUCOPHAGE) 500 MG tablet Take 2 tablets (1,000 mg total) by mouth 2 (two) times daily with a meal. 360 tablet 1   triamcinolone cream (KENALOG) 0.1 % Apply 1 application topically 2 (two) times daily. To rash until resolved. 85 g 0   VENTOLIN HFA 108 (90 Base) MCG/ACT inhaler INHALE 2 PUFFS BY MOUTH EVERY 6 HOURS AS NEEDED FOR WHEEZING 18 g 0   predniSONE (STERAPRED UNI-PAK 21 TAB) 10 MG (21) TBPK tablet Take by mouth daily. As directed 21 tablet 0   Semaglutide,0.25 or 0.5MG /DOS, (OZEMPIC, 0.25 OR 0.5 MG/DOSE,) 2 MG/1.5ML SOPN Inject 0.5 mg into the skin once a week. 1.5 mL 11   No facility-administered medications prior to visit.    Review of Systems  Patient denies headache, fevers, malaise, unintentional weight loss, skin rash, eye pain, sinus congestion and  sinus pain, sore throat, dysphagia,  hemoptysis , cough, dyspnea, wheezing, chest pain, palpitations, orthopnea, edema, abdominal pain, nausea, melena, diarrhea, constipation, flank pain, dysuria, hematuria, urinary  Frequency, nocturia, numbness, tingling, seizures,  Focal weakness, Loss of consciousness,  Tremor, insomnia, depression, anxiety, and suicidal ideation.     Objective:  BP 130/78   Pulse 81   Ht 5' 7.24" (1.708 m)   Wt 230 lb (104.3 kg)   LMP 01/03/2018   SpO2 97%   BMI 35.76 kg/m   Physical Exam  General appearance: alert, cooperative and appears stated age Head: Normocephalic, without obvious abnormality, atraumatic Eyes: conjunctivae/corneas clear. PERRL, EOM's intact. Fundi benign. Ears: normal TM's and external ear canals both ears Nose: Nares normal. Septum midline. Mucosa normal. No drainage or sinus tenderness. Throat: lips, mucosa, and tongue normal; teeth and gums normal Neck: no adenopathy, no carotid bruit, no JVD, supple, symmetrical, trachea midline and thyroid not enlarged, symmetric, no tenderness/mass/nodules Lungs: clear to auscultation bilaterally Breasts: pendulous breasts, no masses or tenderness Heart: regular rate and rhythm, S1, S2 normal, no murmur, click, rub or gallop Abdomen: soft, non-tender; bowel  sounds normal; no masses,  no organomegaly Extremities: extremities normal, atraumatic, no cyanosis or edema Pulses: 2+ and symmetric Skin: Skin color, texture, turgor normal. No rashes or lesions Neurologic: Alert and oriented X 3, normal strength and tone. Normal symmetric reflexes. Normal coordination and gait.     Assessment & Plan:   Problem List Items Addressed This Visit       Unprioritized   Contact with and (suspected) exposure to covid-19    She has had numerous exposures without becoming infected.  Antibody level pending        Encounter for preventive health examination - Primary    age appropriate education and counseling  updated, referrals for preventative services and immunizations addressed, dietary and smoking counseling addressed, most recent labs reviewed.  I have personally reviewed and have noted:   1) the patient's medical and social history 2) The pt's use of alcohol, tobacco, and illicit drugs 3) The patient's current medications and supplements 4) Functional ability including ADL's, fall risk, home safety risk, hearing and visual impairment 5) Diet and physical activities 6) Evidence for depression or mood disorder 7) The patient's height, weight, and BMI have been recorded in the chart   I have made referrals, and provided counseling and education based on review of the above      Hepatic steatosis    She has eliminated alcohol from diet due to elevated blood sugars.   Lab Results  Component Value Date   ALT 35 11/09/2020   AST 30 11/09/2020   ALKPHOS 135 (H) 11/09/2020   BILITOT 1.6 (H) 11/09/2020         Hives of unknown origin    Recommend a regimen of singulair , famotidine 20 mg bid and zyrtec for daily use as preventive       Hyperlipidemia    Tolerating atorvastatin .  Lab Results  Component Value Date   CHOL 167 11/09/2020   HDL 70.20 11/09/2020   LDLCALC 80 11/09/2020   LDLDIRECT 107.0 07/01/2016   TRIG 80.0 11/09/2020   CHOLHDL 2 11/09/2020   Lab Results  Component Value Date   ALT 35 11/09/2020   AST 30 11/09/2020   ALKPHOS 135 (H) 11/09/2020   BILITOT 1.6 (H) 11/09/2020                Otitis media    Given chronicity of symptoms, development of right ear pain and  exam consistent with bacterial otitis ,  Will treat with empiric antibiotics, decongestants, and steroid taper . probiotic advised        Relevant Medications   levofloxacin (LEVAQUIN) 500 MG tablet   Type 2 diabetes mellitus with obesity (HCC)    Changed to Ozempic 1 mg weekly to facilitate weight loss .  Continue metformin      Relevant Medications   Semaglutide, 1 MG/DOSE,  (OZEMPIC, 1 MG/DOSE,) 4 MG/3ML SOPN   Other Visit Diagnoses     Colon cancer screening       Relevant Orders   Ambulatory referral to Gastroenterology   Encounter for screening mammogram for malignant neoplasm of breast       Relevant Orders   MM 3D SCREEN BREAST BILATERAL       I have discontinued Rachel Riggs's Ozempic (0.25 or 0.5 MG/DOSE) and predniSONE. I am also having her start on levofloxacin, predniSONE, Ozempic (1 MG/DOSE), montelukast, and famotidine. Additionally, I am having her maintain her meclizine, levocetirizine, butalbital-acetaminophen-caffeine, EPINEPHrine, metFORMIN, Ventolin HFA, etodolac, triamcinolone cream, atorvastatin,  and benzonatate.  Meds ordered this encounter  Medications   levofloxacin (LEVAQUIN) 500 MG tablet    Sig: Take 1 tablet (500 mg total) by mouth daily for 7 days.    Dispense:  7 tablet    Refill:  0   predniSONE (DELTASONE) 10 MG tablet    Sig: 6 tablets on Day 1 , then reduce by 1 tablet daily until gone    Dispense:  21 tablet    Refill:  0   Semaglutide, 1 MG/DOSE, (OZEMPIC, 1 MG/DOSE,) 4 MG/3ML SOPN    Sig: Inject 1 mg into the skin once a week.    Dispense:  9 mL    Refill:  2   montelukast (SINGULAIR) 10 MG tablet    Sig: Take 1 tablet (10 mg total) by mouth at bedtime.    Dispense:  30 tablet    Refill:  3   famotidine (PEPCID) 20 MG tablet    Sig: Take 1 tablet (20 mg total) by mouth 2 (two) times daily.    Dispense:  60 tablet    Refill:  11    Medications Discontinued During This Encounter  Medication Reason   Semaglutide,0.25 or 0.5MG /DOS, (OZEMPIC, 0.25 OR 0.5 MG/DOSE,) 2 MG/1.5ML SOPN    predniSONE (STERAPRED UNI-PAK 21 TAB) 10 MG (21) TBPK tablet     Follow-up: No follow-ups on file.   Sherlene Shams, MD

## 2021-03-30 DIAGNOSIS — H6690 Otitis media, unspecified, unspecified ear: Secondary | ICD-10-CM | POA: Insufficient documentation

## 2021-03-30 HISTORY — DX: Otitis media, unspecified, unspecified ear: H66.90

## 2021-03-30 NOTE — Assessment & Plan Note (Addendum)
Changed to Ozempic 1 mg weekly to facilitate weight loss .  Continue metformin

## 2021-03-30 NOTE — Assessment & Plan Note (Signed)

## 2021-03-30 NOTE — Assessment & Plan Note (Signed)
She has eliminated alcohol from diet due to elevated blood sugars.   Lab Results  Component Value Date   ALT 35 11/09/2020   AST 30 11/09/2020   ALKPHOS 135 (H) 11/09/2020   BILITOT 1.6 (H) 11/09/2020

## 2021-03-30 NOTE — Assessment & Plan Note (Addendum)
Given chronicity of symptoms, development of right ear pain and  exam consistent with bacterial otitis ,  Will treat with empiric antibiotics, decongestants, and steroid taper . probiotic advised

## 2021-03-30 NOTE — Assessment & Plan Note (Signed)
Tolerating atorvastatin .  Lab Results  Component Value Date   CHOL 167 11/09/2020   HDL 70.20 11/09/2020   LDLCALC 80 11/09/2020   LDLDIRECT 107.0 07/01/2016   TRIG 80.0 11/09/2020   CHOLHDL 2 11/09/2020   Lab Results  Component Value Date   ALT 35 11/09/2020   AST 30 11/09/2020   ALKPHOS 135 (H) 11/09/2020   BILITOT 1.6 (H) 11/09/2020

## 2021-03-30 NOTE — Assessment & Plan Note (Signed)
Recommend a regimen of singulair , famotidine 20 mg bid and zyrtec for daily use as preventive

## 2021-03-30 NOTE — Assessment & Plan Note (Signed)
She has had numerous exposures without becoming infected.  Antibody level pending

## 2021-04-01 ENCOUNTER — Telehealth: Payer: Self-pay | Admitting: Pharmacist

## 2021-04-01 ENCOUNTER — Ambulatory Visit: Payer: BC Managed Care – PPO | Admitting: Pharmacist

## 2021-04-01 ENCOUNTER — Telehealth: Payer: Self-pay

## 2021-04-01 DIAGNOSIS — E782 Mixed hyperlipidemia: Secondary | ICD-10-CM

## 2021-04-01 DIAGNOSIS — Z8709 Personal history of other diseases of the respiratory system: Secondary | ICD-10-CM

## 2021-04-01 DIAGNOSIS — E1169 Type 2 diabetes mellitus with other specified complication: Secondary | ICD-10-CM

## 2021-04-01 MED ORDER — TIRZEPATIDE 2.5 MG/0.5ML ~~LOC~~ SOAJ: 2.5000 mg | SUBCUTANEOUS | 0 refills | Status: AC

## 2021-04-01 MED ORDER — TIRZEPATIDE 5 MG/0.5ML ~~LOC~~ SOAJ: 5.0000 mg | SUBCUTANEOUS | 3 refills | Status: DC

## 2021-04-01 NOTE — Patient Instructions (Signed)
Visit Information  PATIENT GOALS:   Goals Addressed               This Visit's Progress     Patient Stated     Medication Monitoring (pt-stated)        Patient Goals/Self-Care Activities Over the next 90 days, patient will:  - take medications as prescribed target a minimum of 150 minutes of moderate intensity exercise weekly engage in dietary modifications by moderating portion size        Rachel Riggs was given information about Care Management services by the embedded care coordination team including:  Care Management services include personalized support from designated clinical staff supervised by her physician, including individualized plan of care and coordination with other care providers 24/7 contact phone numbers for assistance for urgent and routine care needs. The patient may stop CCM services at any time (effective at the end of the month) by phone call to the office staff.  Patient agreed to services and verbal consent obtained.   Patient verbalizes understanding of instructions provided today and agrees to view in MyChart.  Plan: Telephone follow up appointment with care management team member scheduled for:  ~ 4 weeks  Catie Feliz Beam, PharmD, [place], CPP Clinical Pharmacist [facility] [phone]

## 2021-04-01 NOTE — Telephone Encounter (Signed)
Medication Samples have been labeled and logged for the patient.  Drug name: Greggory Keen       Strength: 2.5 mg        Qty: 1 box  LOT: [id number]  Exp.Date: 05/07/22  Dosing instructions: Inject 2.5 mg once weekly for 4 weeks, then increase to 0.5 mg weekly  The patient has been instructed regarding the correct time, dose, and frequency of taking this medication, including desired effects and most common side effects.   Lourena Simmonds 12:33 PM 04/01/2021

## 2021-04-01 NOTE — Chronic Care Management (AMB) (Signed)
Care Management   Pharmacy Note  04/01/2021 Name: Rachel Riggs MRN: [id number] DOB: 05-Oct-1960  Subjective: Rachel Riggs is a 60 y.o. year old female who is a primary care patient of Rachel Shams, MD. The Care Management team was consulted for assistance with care management and care coordination needs.    Engaged with patient by telephone for initial visit in response to provider referral for pharmacy case management and/or care coordination services.   The patient was given information about Care Management services today including:  Care Management services includes personalized support from designated clinical staff supervised by the patient's primary care provider, including individualized plan of care and coordination with other care providers. 24/7 contact phone numbers for assistance for urgent and routine care needs. The patient may stop case management services at any time by phone call to the office staff.  Patient agreed to services and consent obtained.  Assessment:  Review of patient status, including review of consultants reports, laboratory and other test data, was performed as part of comprehensive evaluation and provision of chronic care management services.   SDOH (Social Determinants of Health) assessments and interventions performed:  SDOH Interventions    Flowsheet Row Most Recent Value  SDOH Interventions   Financial Strain Interventions Intervention Not Indicated        Objective:  Lab Results  Component Value Date   CREATININE 0.73 11/09/2020   CREATININE 0.63 09/09/2019   CREATININE 0.66 03/25/2019    Lab Results  Component Value Date   HGBA1C 6.3 11/09/2020       Component Value Date/Time   CHOL 167 11/09/2020 0807   TRIG 80.0 11/09/2020 0807   HDL 70.20 11/09/2020 0807   CHOLHDL 2 11/09/2020 0807   VLDL 16.0 11/09/2020 0807   LDLCALC 80 11/09/2020 0807   LDLDIRECT 107.0 07/01/2016 1003     Clinical ASCVD: No  The 10-year  ASCVD risk score Denman George DC Jr., et al., 2013) is: 4.8%   Values used to calculate the score:     Age: 60 years     Sex: Female     Is Non-Hispanic African American: No     Diabetic: Yes     Tobacco smoker: No     Systolic Blood Pressure: 130 mmHg     Is BP treated: No     HDL Cholesterol: 70.2 mg/dL     Total Cholesterol: 167 mg/dL     BP Readings from Last 3 Encounters:  03/28/21 130/78  12/31/20 (!) 134/92  02/21/19 140/82    Care Plan  Allergies  Allergen Reactions   Amoxicillin Anaphylaxis   Aspirin Anaphylaxis   Ibuprofen Anaphylaxis   Penicillins Anaphylaxis   Sulfa Antibiotics Anaphylaxis    Medications Reviewed Today     Reviewed by Lourena Simmonds, RPH-CPP (Pharmacist) on 04/01/21 at 1149  Med List Status: <None>   Medication Order Taking? Sig Documenting Provider Last Dose Status Informant  atorvastatin (LIPITOR) 20 MG tablet [id number] Yes Take 1 tablet (20 mg total) by mouth daily. Rachel Shams, MD Taking Active   butalbital-acetaminophen-caffeine (FIORICET) [phone] MG tablet [id number] No TAKE 1 TO 2 TABLETS BY MOUTH EVERY 6 HOURS AS NEEDED FOR HEADACHE  Patient not taking: Reported on 04/01/2021   Rachel Shams, MD Not Taking Active   EPINEPHrine 0.3 mg/0.3 mL IJ SOAJ injection [id number] No Inject 0.3 mLs (0.3 mg total) into the muscle as needed for anaphylaxis.  Patient not taking: Reported on 04/01/2021   Rachel Riggs  L, MD Not Taking Active   etodolac (LODINE) 500 MG tablet [id number] No Take 1 tablet (500 mg total) by mouth 2 (two) times daily.  Patient not taking: Reported on 04/01/2021   Rachel Shams, MD Not Taking Active   famotidine (PEPCID) 20 MG tablet [id number] Yes Take 1 tablet (20 mg total) by mouth 2 (two) times daily. Rachel Shams, MD Taking Active   levocetirizine Rachel Riggs) 5 MG tablet [id number] Yes  [provider] Taking Active   levofloxacin (LEVAQUIN) 500 MG tablet [id number] Yes Take 1 tablet (500 mg total) by mouth  daily for 7 days. Rachel Shams, MD Taking Active   meclizine (ANTIVERT) 25 MG tablet [id number] Yes TAKE 1 TABLET BY MOUTH THREE TIMES DAILY AS NEEDED Rachel Shams, MD Taking Active   metFORMIN (GLUCOPHAGE) 500 MG tablet [id number] Yes Take 2 tablets (1,000 mg total) by mouth 2 (two) times daily with a meal. Rachel Shams, MD Taking Active            Med Note Rachel Riggs Apr 01, 2021 11:44 AM) 500 mg BID  montelukast (SINGULAIR) 10 MG tablet [id number] Yes Take 1 tablet (10 mg total) by mouth at bedtime. Rachel Shams, MD Taking Active   predniSONE (DELTASONE) 10 MG tablet [id number] Yes 6 tablets on Day 1 , then reduce by 1 tablet daily until gone Rachel Shams, MD Taking Active   Semaglutide, 1 MG/DOSE, (OZEMPIC, 1 MG/DOSE,) 4 MG/3ML SOPN [id number] Yes Inject 1 mg into the skin once a week. Rachel Shams, MD Taking Active   triamcinolone cream (KENALOG) 0.1 % [id number] Yes Apply 1 application topically 2 (two) times daily. To rash until resolved. Rachel Shams, MD Taking Active   VENTOLIN HFA 108 [phone] Base) MCG/ACT inhaler [id number] No INHALE 2 PUFFS BY MOUTH EVERY 6 HOURS AS NEEDED FOR WHEEZING  Patient not taking: Reported on 04/01/2021   Rachel Shams, MD Not Taking Active             Patient Active Problem List   Diagnosis Date Noted   Otitis media 03/30/2021   Post-menopausal bleeding 02/22/2019   Contact with and (suspected) exposure to covid-19 02/22/2019   Aspirin contraindicated 08/05/2016   Generalized anxiety disorder 06/26/2015   Anemia, iron deficiency 09/14/2014   Hives of unknown origin 09/11/2014   Hepatic steatosis 12/22/2013   Leukocytosis, unspecified 12/22/2013   Type 2 diabetes mellitus with obesity (HCC) 12/21/2013   Encounter for preventive health examination 10/13/2012   Screening for cervical cancer 10/06/2012   Allergic rhinitis 09/08/2012   History of asthma 09/07/2012   Hyperlipidemia 09/07/2012   Obesity 09/07/2012    Family history of cervical cancer 09/07/2012    Conditions to be addressed/monitored: DMII  Care Plan : Medication Management  Updates made by Lourena Simmonds, RPH-CPP since 04/01/2021 12:00 AM     Problem: Diabetes, Allergies      Long-Range Goal: Disease Progression Prevention   Start Date: 04/01/2021  This Visit's Progress: On track  Priority: High  Note:   Current Barriers:  Unable to achieve control of weight   Pharmacist Clinical Goal(s):  Over the next 90 days, patient will achieve improvement in weight through collaboration with PharmD and provider.   Interventions: 1:1 collaboration with Rachel Shams, MD regarding development and update of comprehensive plan of care as evidenced by provider attestation and co-signature Inter-disciplinary care team collaboration (see longitudinal plan of care) Comprehensive medication review  performed; medication list updated in electronic medical record  Diabetes, Obesity Controlled but with desire for improved weight management support; current treatment: metformin 500 mg BID (prescribed 1000 mg BID), Ozempic 1 mg weekly - called last week to report that she was unable to get 1 mg dose in stock.  Discussed improved benefit of A1c and weight loss of Mounjaro vs Ozempic 1 mg in SURPASS clinical trial, as well as improved access at this time given Ozempic back order. Discussed side effects, mechanism of action. Patient amenable. Start Mounjaro 2.5 mg weekly x 4 weeks then increase to 5 mg weekly. Providing sample of 2.5 mg weekly dose.   Hyperlipidemia: Controlled; current treatment: atorvastatin 20 mg daily;  Recommended to continue current regimen at this time  Topical Allergies Uncontrolled prior to recent medication changes; current treatment: levocetirizine 5 mg daily; PCP just added montelukast 10 mg daily and famotidine 20 mg BID; also concurrent prednisone and levofloxacin courses  Fioricet PRN headaches related to allergies.  Advised on minimal use of this agent given abuse potential and [place] list status (as she ages) Encouraged to continue current regimen at this time. Discussed potential incorporation of hydoxyzine PRN moving forward. Also discussed role of allergist given new injectable biologic therapies for topical allergen disease. Patient verbalized understanding  Patient Goals/Self-Care Activities Over the next 90 days, patient will:  - take medications as prescribed target a minimum of 150 minutes of moderate intensity exercise weekly engage in dietary modifications by moderating portion size  Follow Up Plan: Telephone follow up appointment with care management team member scheduled for: ~ 4 weeks      Medication Assistance:  None required.  Patient affirms current coverage meets needs.  Follow Up:  Patient agrees to Care Plan and Follow-up.  Plan: Telephone follow up appointment with care management team member scheduled for:  ~ 4 weeks  Catie Feliz Beam, PharmD, [place], CPP Clinical Pharmacist [facility] [employer] [employer] [phone]

## 2021-04-01 NOTE — Chronic Care Management (AMB) (Signed)
  Care Management   Note  04/01/2021 Name: Rachel Riggs MRN: [id number] DOB: [birth year]  [name] is a 60 y.o. year old female who is a primary care patient of Darrick Huntsman, [name], MD. I reached out to [name] by phone today in response to a referral sent by [name] health plan.    [name] was given information about care management services today including:  Care management services include personalized support from designated clinical staff supervised by her physician, including individualized plan of care and coordination with other care providers 24/7 contact phone numbers for assistance for urgent and routine care needs. The patient may stop care management services at any time by phone call to the office staff.  Patient agreed to services and verbal consent obtained.   Follow up plan: Telephone appointment with care management team member scheduled for:04/01/2021  Penne Lash, RMA Care Guide, Embedded Care Coordination [facility]  [place], [place] [address] Direct Dial: [phone] [email or web address]

## 2021-04-02 ENCOUNTER — Telehealth: Payer: Self-pay

## 2021-04-02 NOTE — Telephone Encounter (Signed)
WANTS TO NOT SCHEDULE RIGHT NOW AND WHEN SHE IS READY TO SCHEDULE SHE IS GONNA CALL DR TULU OFFICE  HERSELF

## 2021-04-02 NOTE — Telephone Encounter (Signed)
Patient picked up [facility] sample. Demonstrated injection technique with demo pen. She verbalized understanding

## 2021-04-29 ENCOUNTER — Ambulatory Visit: Payer: BC Managed Care – PPO | Admitting: Pharmacist

## 2021-04-29 DIAGNOSIS — E782 Mixed hyperlipidemia: Secondary | ICD-10-CM

## 2021-04-29 DIAGNOSIS — E1169 Type 2 diabetes mellitus with other specified complication: Secondary | ICD-10-CM

## 2021-04-29 DIAGNOSIS — E669 Obesity, unspecified: Secondary | ICD-10-CM

## 2021-04-29 MED ORDER — METFORMIN HCL 500 MG PO TABS: 500.0000 mg | ORAL_TABLET | Freq: Two times a day (BID) | ORAL | 3 refills | Status: DC

## 2021-04-29 NOTE — Chronic Care Management (AMB) (Signed)
Care Management   Pharmacy Note  04/29/2021 Name: ANIJA BRICKNER MRN: [id number] DOB: 1961/01/22  Subjective: IDALIE CANTO is a 60 y.o. year old female who is a primary care patient of Sherlene Shams, MD. The Care Management team was consulted for assistance with care management and care coordination needs.    Engaged with patient by telephone for follow up visit in response to provider referral for pharmacy case management and/or care coordination services.   The patient was given information about Care Management services today including:  Care Management services includes personalized support from designated clinical staff supervised by the patient's primary care provider, including individualized plan of care and coordination with other care providers. 24/7 contact phone numbers for assistance for urgent and routine care needs. The patient may stop case management services at any time by phone call to the office staff.  Patient agreed to services and consent obtained.  Assessment:  Review of patient status, including review of consultants reports, laboratory and other test data, was performed as part of comprehensive evaluation and provision of chronic care management services.   SDOH (Social Determinants of Health) assessments and interventions performed:  SDOH Interventions    Flowsheet Row Most Recent Value  SDOH Interventions   Financial Strain Interventions Intervention Not Indicated        Objective:  Lab Results  Component Value Date   CREATININE 0.73 11/09/2020   CREATININE 0.63 09/09/2019   CREATININE 0.66 03/25/2019    Lab Results  Component Value Date   HGBA1C 6.3 11/09/2020       Component Value Date/Time   CHOL 167 11/09/2020 0807   TRIG 80.0 11/09/2020 0807   HDL 70.20 11/09/2020 0807   CHOLHDL 2 11/09/2020 0807   VLDL 16.0 11/09/2020 0807   LDLCALC 80 11/09/2020 0807   LDLDIRECT 107.0 07/01/2016 1003     BP Readings from Last 3  Encounters:  03/28/21 130/78  12/31/20 (!) 134/92  02/21/19 140/82    Care Plan  Allergies  Allergen Reactions   Amoxicillin Anaphylaxis   Aspirin Anaphylaxis   Ibuprofen Anaphylaxis   Penicillins Anaphylaxis   Sulfa Antibiotics Anaphylaxis    Medications Reviewed Today     Reviewed by Lourena Simmonds, RPH-CPP (Pharmacist) on 04/29/21 at 1448  Med List Status: <None>   Medication Order Taking? Sig Documenting Provider Last Dose Status Informant  atorvastatin (LIPITOR) 20 MG tablet [id number] Yes Take 1 tablet (20 mg total) by mouth daily. Sherlene Shams, MD Taking Active   butalbital-acetaminophen-caffeine (FIORICET) [phone] MG tablet [id number] No TAKE 1 TO 2 TABLETS BY MOUTH EVERY 6 HOURS AS NEEDED FOR HEADACHE  Patient not taking: No sig reported   Sherlene Shams, MD Not Taking Active   EPINEPHrine 0.3 mg/0.3 mL IJ SOAJ injection [id number]  Inject 0.3 mLs (0.3 mg total) into the muscle as needed for anaphylaxis.  Patient not taking: Reported on 04/01/2021   Sherlene Shams, MD  Active   etodolac (LODINE) 500 MG tablet [id number] No Take 1 tablet (500 mg total) by mouth 2 (two) times daily.  Patient not taking: No sig reported   Sherlene Shams, MD Not Taking Active   famotidine (PEPCID) 20 MG tablet [id number] Yes Take 1 tablet (20 mg total) by mouth 2 (two) times daily. Sherlene Shams, MD Taking Active   levocetirizine (XYZAL) 5 MG tablet [id number] Yes Take 5 mg by mouth every evening. [provider] Taking Active   meclizine (ANTIVERT) 25 MG tablet  [id number] No TAKE 1 TABLET BY MOUTH THREE TIMES DAILY AS NEEDED  Patient not taking: Reported on 04/29/2021   Sherlene Shams, MD Not Taking Active   metFORMIN (GLUCOPHAGE) 500 MG tablet [id number] Yes Take 2 tablets (1,000 mg total) by mouth 2 (two) times daily with a meal. Sherlene Shams, MD Taking Active            Med Note [name] Apr 01, 2021 11:44 AM) 500 mg BID  montelukast (SINGULAIR) 10  MG tablet [id number] Yes Take 1 tablet (10 mg total) by mouth at bedtime. Sherlene Shams, MD Taking Active   tirzepatide [facility]) 5 MG/0.5ML Pen [id number] No Inject 5 mg into the skin once a week.  Patient not taking: Reported on 04/29/2021   Sherlene Shams, MD Not Taking Active   triamcinolone cream (KENALOG) 0.1 % [id number] Yes Apply 1 application topically 2 (two) times daily. To rash until resolved. Sherlene Shams, MD Taking Active   VENTOLIN HFA 108 [phone] Base) MCG/ACT inhaler [id number] No INHALE 2 PUFFS BY MOUTH EVERY 6 HOURS AS NEEDED FOR WHEEZING  Patient not taking: No sig reported   Sherlene Shams, MD Not Taking Active             Patient Active Problem List   Diagnosis Date Noted   Otitis media 03/30/2021   Post-menopausal bleeding 02/22/2019   Contact with and (suspected) exposure to covid-19 02/22/2019   Aspirin contraindicated 08/05/2016   Generalized anxiety disorder 06/26/2015   Anemia, iron deficiency 09/14/2014   Hives of unknown origin 09/11/2014   Hepatic steatosis 12/22/2013   Leukocytosis, unspecified 12/22/2013   Type 2 diabetes mellitus with obesity (HCC) 12/21/2013   Encounter for preventive health examination 10/13/2012   Screening for cervical cancer 10/06/2012   Allergic rhinitis 09/08/2012   History of asthma 09/07/2012   Hyperlipidemia 09/07/2012   Obesity 09/07/2012   Family history of cervical cancer 09/07/2012    Conditions to be addressed/monitored: DMII  Care Plan : Medication Management  Updates made by Lourena Simmonds, RPH-CPP since 04/29/2021 12:00 AM     Problem: Diabetes, Allergies      Long-Range Goal: Disease Progression Prevention   Start Date: 04/01/2021  This Visit's Progress: On track  Recent Progress: On track  Priority: High  Note:   Current Barriers:  Unable to achieve control of weight   Pharmacist Clinical Goal(s):  Over the next 90 days, patient will achieve improvement in weight through collaboration with  PharmD and provider.   Interventions: 1:1 collaboration with Sherlene Shams, MD regarding development and update of comprehensive plan of care as evidenced by provider attestation and co-signature Inter-disciplinary care team collaboration (see longitudinal plan of care) Comprehensive medication review performed; medication list updated in electronic medical record  SDOH: Reports significant stress, husband just diagnosed with Stage 4 lung cancer  Diabetes, Obesity Controlled but with desire for improved weight management support; current treatment: metformin 500 mg BID, Mounjaro 2.5 mg weekly Denies GI upset with this dose of Mounjaro. Denies weight loss benefit either. Reports less GI upset than she felt on Ozempic.  Current BG readings: not checking Recommend to increase Mounjaro to 5 mg weekly as previously planned. Discussed potential for increased risk of GI upset with higher dose. Patient amenable.   Hyperlipidemia: Controlled per last lab value; current treatment: atorvastatin 20 mg daily;  Previously recommended to continue current regimen at this time  Topical Allergies Significantly improved; current treatment:  levocetirizine 5 mg daily, montelukast 10 mg daily, famotidine 20 mg BID Reports she has not had hives since montelukast and famotidine were added.  Fioricet PRN headaches related to allergies. Previously advised on minimal use of this agent given abuse potential and Beers list status (as she ages). Reports no use recently.  Recommended to continue current regimen at this time  Patient Goals/Self-Care Activities Over the next 90 days, patient will:  - take medications as prescribed target a minimum of 150 minutes of moderate intensity exercise weekly engage in dietary modifications by moderating portion size  Follow Up Plan: Telephone follow up appointment with care management team member scheduled for: ~ 4 weeks      Medication Assistance:  None required.   Patient affirms current coverage meets needs.  Follow Up:  Patient agrees to Care Plan and Follow-up.  Plan: Telephone follow up appointment with care management team member scheduled for:  ~ 4 weeks  Catie Feliz Beam, PharmD, [place], CPP Clinical Pharmacist [facility] [phone]

## 2021-04-29 NOTE — Patient Instructions (Signed)
Visit Information   Goals Addressed               This Visit's Progress     Patient Stated     Medication Monitoring (pt-stated)        Patient Goals/Self-Care Activities Over the next 90 days, patient will:  - take medications as prescribed target a minimum of 150 minutes of moderate intensity exercise weekly engage in dietary modifications by moderating portion size         Patient verbalizes understanding of instructions provided today and agrees to view in MyChart.  Plan: Telephone follow up appointment with care management team member scheduled for:  ~ 4 weeks  Catie Feliz Beam, PharmD, [place], CPP Clinical Pharmacist [facility] [employer] [phone]

## 2021-05-13 ENCOUNTER — Other Ambulatory Visit: Payer: Self-pay | Admitting: Internal Medicine

## 2021-05-15 ENCOUNTER — Other Ambulatory Visit (HOSPITAL_COMMUNITY): Payer: Self-pay

## 2021-05-15 MED ORDER — INFLUENZA VAC SPLIT QUAD 0.5 ML IM SUSY
PREFILLED_SYRINGE | INTRAMUSCULAR | 0 refills | Status: DC
  Filled 2021-05-15: qty 0.5, 1d supply, fill #0

## 2021-05-17 ENCOUNTER — Other Ambulatory Visit: Payer: Self-pay | Admitting: Internal Medicine

## 2021-05-27 ENCOUNTER — Ambulatory Visit: Payer: BC Managed Care – PPO | Admitting: Pharmacist

## 2021-05-27 DIAGNOSIS — E1169 Type 2 diabetes mellitus with other specified complication: Secondary | ICD-10-CM

## 2021-05-27 MED ORDER — TIRZEPATIDE 7.5 MG/0.5ML ~~LOC~~ SOAJ: 7.5000 mg | SUBCUTANEOUS | 2 refills | Status: DC

## 2021-05-27 NOTE — Chronic Care Management (AMB) (Signed)
Care Management   Pharmacy Note  05/27/2021 Name: TERI LEGACY MRN: [id number] DOB: 1961/05/05  Subjective: Rachel Riggs is a 60 y.o. year old female who is a primary care patient of Sherlene Shams, MD. The Care Management team was consulted for assistance with care management and care coordination needs.    Engaged with patient by telephone for follow up visit in response to provider referral for pharmacy case management and/or care coordination services.   The patient was given information about Care Management services today including:  Care Management services includes personalized support from designated clinical staff supervised by the patient's primary care provider, including individualized plan of care and coordination with other care providers. 24/7 contact phone numbers for assistance for urgent and routine care needs. The patient may stop case management services at any time by phone call to the office staff.  Patient agreed to services and consent obtained.  Assessment:  Review of patient status, including review of consultants reports, laboratory and other test data, was performed as part of comprehensive evaluation and provision of chronic care management services.   SDOH (Social Determinants of Health) assessments and interventions performed:  SDOH Interventions    Flowsheet Row Most Recent Value  SDOH Interventions   Financial Strain Interventions Intervention Not Indicated        Objective:  Lab Results  Component Value Date   CREATININE 0.73 11/09/2020   CREATININE 0.63 09/09/2019   CREATININE 0.66 03/25/2019    Lab Results  Component Value Date   HGBA1C 6.3 11/09/2020       Component Value Date/Time   CHOL 167 11/09/2020 0807   TRIG 80.0 11/09/2020 0807   HDL 70.20 11/09/2020 0807   CHOLHDL 2 11/09/2020 0807   VLDL 16.0 11/09/2020 0807   LDLCALC 80 11/09/2020 0807   LDLDIRECT 107.0 07/01/2016 1003    BP Readings from Last 3  Encounters:  03/28/21 130/78  12/31/20 (!) 134/92  02/21/19 140/82    Care Plan  Allergies  Allergen Reactions   Amoxicillin Anaphylaxis   Aspirin Anaphylaxis   Ibuprofen Anaphylaxis   Penicillins Anaphylaxis   Sulfa Antibiotics Anaphylaxis    Medications Reviewed Today     Reviewed by Lourena Simmonds, RPH-CPP (Pharmacist) on 04/29/21 at 1448  Med List Status: <None>   Medication Order Taking? Sig Documenting Provider Last Dose Status Informant  atorvastatin (LIPITOR) 20 MG tablet [id number] Yes Take 1 tablet (20 mg total) by mouth daily. Sherlene Shams, MD Taking Active   butalbital-acetaminophen-caffeine (FIORICET) [phone] MG tablet [id number] No TAKE 1 TO 2 TABLETS BY MOUTH EVERY 6 HOURS AS NEEDED FOR HEADACHE  Patient not taking: No sig reported   Sherlene Shams, MD Not Taking Active   EPINEPHrine 0.3 mg/0.3 mL IJ SOAJ injection [id number]  Inject 0.3 mLs (0.3 mg total) into the muscle as needed for anaphylaxis.  Patient not taking: Reported on 04/01/2021   Sherlene Shams, MD  Active   etodolac (LODINE) 500 MG tablet [id number] No Take 1 tablet (500 mg total) by mouth 2 (two) times daily.  Patient not taking: No sig reported   Sherlene Shams, MD Not Taking Active   famotidine (PEPCID) 20 MG tablet [id number] Yes Take 1 tablet (20 mg total) by mouth 2 (two) times daily. Sherlene Shams, MD Taking Active   levocetirizine (XYZAL) 5 MG tablet [id number] Yes Take 5 mg by mouth every evening. [provider] Taking Active   meclizine (ANTIVERT) 25 MG tablet [id number]  No TAKE 1 TABLET BY MOUTH THREE TIMES DAILY AS NEEDED  Patient not taking: Reported on 04/29/2021   Sherlene Shams, MD Not Taking Active   metFORMIN (GLUCOPHAGE) 500 MG tablet [id number] Yes Take 2 tablets (1,000 mg total) by mouth 2 (two) times daily with a meal. Sherlene Shams, MD Taking Active            Med Note [name] Apr 01, 2021 11:44 AM) 500 mg BID  montelukast (SINGULAIR) 10  MG tablet [id number] Yes Take 1 tablet (10 mg total) by mouth at bedtime. Sherlene Shams, MD Taking Active   tirzepatide [facility]) 5 MG/0.5ML Pen [id number] No Inject 5 mg into the skin once a week.  Patient not taking: Reported on 04/29/2021   Sherlene Shams, MD Not Taking Active   triamcinolone cream (KENALOG) 0.1 % [id number] Yes Apply 1 application topically 2 (two) times daily. To rash until resolved. Sherlene Shams, MD Taking Active   VENTOLIN HFA 108 [phone] Base) MCG/ACT inhaler [id number] No INHALE 2 PUFFS BY MOUTH EVERY 6 HOURS AS NEEDED FOR WHEEZING  Patient not taking: No sig reported   Sherlene Shams, MD Not Taking Active             Patient Active Problem List   Diagnosis Date Noted   Otitis media 03/30/2021   Post-menopausal bleeding 02/22/2019   Contact with and (suspected) exposure to covid-19 02/22/2019   Aspirin contraindicated 08/05/2016   Generalized anxiety disorder 06/26/2015   Anemia, iron deficiency 09/14/2014   Hives of unknown origin 09/11/2014   Hepatic steatosis 12/22/2013   Leukocytosis, unspecified 12/22/2013   Type 2 diabetes mellitus with obesity (HCC) 12/21/2013   Encounter for preventive health examination 10/13/2012   Screening for cervical cancer 10/06/2012   Allergic rhinitis 09/08/2012   History of asthma 09/07/2012   Hyperlipidemia 09/07/2012   Obesity 09/07/2012   Family history of cervical cancer 09/07/2012    Conditions to be addressed/monitored: DMII  Care Plan : Medication Management  Updates made by Lourena Simmonds, RPH-CPP since 05/27/2021 12:00 AM     Problem: Diabetes, Allergies      Long-Range Goal: Disease Progression Prevention   Start Date: 04/01/2021  Recent Progress: On track  Priority: High  Note:   Current Barriers:  Unable to achieve control of weight   Pharmacist Clinical Goal(s):  Over the next 90 days, patient will achieve improvement in weight through collaboration with PharmD and provider.    Interventions: 1:1 collaboration with Sherlene Shams, MD regarding development and update of comprehensive plan of care as evidenced by provider attestation and co-signature Inter-disciplinary care team collaboration (see longitudinal plan of care) Comprehensive medication review performed; medication list updated in electronic medical record  SDOH: Reports significant stress, husband just diagnosed with Stage 4 lung cancer  Diabetes, Obesity Controlled but with desire for improved weight management support; current treatment: metformin 500 mg BID, Mounjaro 5 mg weekly Denies GI upset, but also denies weight loss. Does indicate some appetite suppression, though notes significant stress related to her husband's health recently.  Increase Mounjaro to 7.5 mg weekly. Continue metformin 500 mg BID. Discussed scheduling labs (A1c, CMP, Lipids), though patient prefers to wait until November/December given upcoming travels and appointments for her husband. Will follow up at next appointment.   Hyperlipidemia: Controlled per last lab value; current treatment: atorvastatin 20 mg daily;  Previously recommended to continue current regimen at this time  Topical Allergies Significantly  improved; current treatment: levocetirizine 5 mg daily, montelukast 10 mg daily, famotidine 20 mg BID Fioricet PRN headaches related to allergies. Previously advised on minimal use of this agent given abuse potential and Beers list status (as she ages). Reports no use recently.  Previously recommended to continue current regimen at this time  Patient Goals/Self-Care Activities Over the next 90 days, patient will:  - take medications as prescribed target a minimum of 150 minutes of moderate intensity exercise weekly engage in dietary modifications by moderating portion size  Follow Up Plan: Telephone follow up appointment with care management team member scheduled for: ~ 4 weeks      Medication Assistance:  None  required.  Patient affirms current coverage meets needs.  Follow Up:  Patient agrees to Care Plan and Follow-up.  Plan: Telephone follow up appointment with care management team member scheduled for:  4 weeks   Catie Feliz Beam, PharmD, [place], CPP Clinical Pharmacist [facility] [employer] [phone]

## 2021-05-27 NOTE — Patient Instructions (Signed)
Visit Information   Goals Addressed               This Visit's Progress     Patient Stated     Medication Monitoring (pt-stated)        Patient Goals/Self-Care Activities Over the next 90 days, patient will:  - take medications as prescribed target a minimum of 150 minutes of moderate intensity exercise weekly engage in dietary modifications by moderating portion size         Patient verbalizes understanding of instructions provided today and agrees to view in MyChart.  Plan: Telephone follow up appointment with care management team member scheduled for:  ~ 4 weeks  Catie Felina Tello, PharmD, BCACP, CPP Clinical Pharmacist [facility] [place] [phone] 

## 2021-06-21 ENCOUNTER — Other Ambulatory Visit: Payer: Self-pay | Admitting: Internal Medicine

## 2021-06-21 ENCOUNTER — Ambulatory Visit: Payer: BC Managed Care – PPO | Admitting: Pharmacist

## 2021-06-21 DIAGNOSIS — Z1231 Encounter for screening mammogram for malignant neoplasm of breast: Secondary | ICD-10-CM

## 2021-06-21 DIAGNOSIS — E669 Obesity, unspecified: Secondary | ICD-10-CM

## 2021-06-21 DIAGNOSIS — E782 Mixed hyperlipidemia: Secondary | ICD-10-CM

## 2021-06-21 MED ORDER — TIRZEPATIDE 10 MG/0.5ML ~~LOC~~ SOAJ: 10.0000 mg | SUBCUTANEOUS | 2 refills | Status: DC

## 2021-06-21 NOTE — Patient Instructions (Signed)
[place],   Increase Mounjaro to 10 mg weekly. I have sent this to your pharmacy.   Take care,   Catie Feliz Beam, PharmD  Visit Information   Patient verbalizes understanding of instructions provided today and agrees to view in MyChart.   Plan: Telephone follow up appointment with care management team member scheduled for:  6 weeks  Catie Feliz Beam, PharmD, [place], CPP Clinical Pharmacist [facility] [employer] [phone]

## 2021-06-21 NOTE — Chronic Care Management (AMB) (Signed)
Chronic Care Management CCM Pharmacy Note  06/21/2021 Name:  Rachel Riggs MRN:  [id number] DOB:  1961/06/10  Summary: - Little weight loss. Tolerating well.   Recommendations/Changes made from today's visit: - Increase Mounjaro to 10 mg daily. Scheduled for lab work next month.   Subjective: Rachel Riggs is an 60 y.o. year old female who is a primary patient of Rachel Riggs.  The CCM team was consulted for assistance with disease management and care coordination needs.    Engaged with patient by telephone for follow up visit for pharmacy case management and/or care coordination services.   Objective:  Medications Reviewed Today     Reviewed by Lourena Simmonds, RPH-CPP (Pharmacist) on 04/29/21 at 1448  Med List Status: <None>   Medication Order Taking? Sig Documenting Provider Last Dose Status Informant  atorvastatin (LIPITOR) 20 MG tablet [id number] Yes Take 1 tablet (20 mg total) by mouth daily. Sherlene Shams, Riggs Taking Active   butalbital-acetaminophen-caffeine (FIORICET) [phone] MG tablet [id number] No TAKE 1 TO 2 TABLETS BY MOUTH EVERY 6 HOURS AS NEEDED FOR HEADACHE  Patient not taking: No sig reported   Sherlene Shams, Riggs Not Taking Active   EPINEPHrine 0.3 mg/0.3 mL IJ SOAJ injection [id number]  Inject 0.3 mLs (0.3 mg total) into the muscle as needed for anaphylaxis.  Patient not taking: Reported on 04/01/2021   Sherlene Shams, Riggs  Active   etodolac (LODINE) 500 MG tablet [id number] No Take 1 tablet (500 mg total) by mouth 2 (two) times daily.  Patient not taking: No sig reported   Sherlene Shams, Riggs Not Taking Active   famotidine (PEPCID) 20 MG tablet [id number] Yes Take 1 tablet (20 mg total) by mouth 2 (two) times daily. Sherlene Shams, Riggs Taking Active   levocetirizine (XYZAL) 5 MG tablet [id number] Yes Take 5 mg by mouth every evening. Provider, Historical, Riggs Taking Active   meclizine (ANTIVERT) 25 MG tablet [id number] No TAKE 1 TABLET BY MOUTH  THREE TIMES DAILY AS NEEDED  Patient not taking: Reported on 04/29/2021   Sherlene Shams, Riggs Not Taking Active   metFORMIN (GLUCOPHAGE) 500 MG tablet [id number] Yes Take 2 tablets (1,000 mg total) by mouth 2 (two) times daily with a meal. Sherlene Shams, Riggs Taking Active            Med Note [name] Apr 01, 2021 11:44 AM) 500 mg BID  montelukast (SINGULAIR) 10 MG tablet [id number] Yes Take 1 tablet (10 mg total) by mouth at bedtime. Sherlene Shams, Riggs Taking Active   tirzepatide [facility]) 5 MG/0.5ML Pen [id number] No Inject 5 mg into the skin once a week.  Patient not taking: Reported on 04/29/2021   Sherlene Shams, Riggs Not Taking Active   triamcinolone cream (KENALOG) 0.1 % [id number] Yes Apply 1 application topically 2 (two) times daily. To rash until resolved. Sherlene Shams, Riggs Taking Active   VENTOLIN HFA 108 [phone] Base) MCG/ACT inhaler [id number] No INHALE 2 PUFFS BY MOUTH EVERY 6 HOURS AS NEEDED FOR WHEEZING  Patient not taking: No sig reported   Sherlene Shams, Riggs Not Taking Active             Pertinent Labs:   Lab Results  Component Value Date   HGBA1C 6.3 11/09/2020   Lab Results  Component Value Date   CHOL 167 11/09/2020   HDL 70.20 11/09/2020   LDLCALC 80 11/09/2020  LDLDIRECT 107.0 07/01/2016   TRIG 80.0 11/09/2020   CHOLHDL 2 11/09/2020   Lab Results  Component Value Date   CREATININE 0.73 11/09/2020   BUN 20 11/09/2020   NA 136 11/09/2020   K 4.3 11/09/2020   CL 102 11/09/2020   CO2 26 11/09/2020    SDOH:  (Social Determinants of Health) assessments and interventions performed:    CCM Care Plan  Review of patient past medical history, allergies, medications, health status, including review of consultants reports, laboratory and other test data, was performed as part of comprehensive evaluation and provision of chronic care management services.   Care Plan : Medication Management  Updates made by Lourena Simmonds, RPH-CPP since  06/21/2021 12:00 AM     Problem: Diabetes, Allergies      Long-Range Goal: Disease Progression Prevention   Start Date: 04/01/2021  Recent Progress: On track  Priority: High  Note:   Current Barriers:  Unable to achieve control of weight   Pharmacist Clinical Goal(s):  Over the next 90 days, patient will achieve improvement in weight through collaboration with PharmD and provider.   Interventions: 1:1 collaboration with Sherlene Shams, Riggs regarding development and update of comprehensive plan of care as evidenced by provider attestation and co-signature Inter-disciplinary care team collaboration (see longitudinal plan of care) Comprehensive medication review performed; medication list updated in electronic medical record  SDOH: Continuing to support her husband with his cancer treatment.   Diabetes, Obesity Controlled but with desire for improved weight management support; current treatment: metformin 500 mg BID, Mounjaro 7.5 mg weekly Vacation lately. Went to a wedding. Enjoying time with her husband.  Denies GI upset. Does notice impact on appetite, but denies any negative impact on quality of life.  Baseline weight: 235 lbs; most recent weight 228 lbs; total weight loss thus far 7 lbs (3%) Current meal patterns: breakfast: 2 eggs, piece of toast; coffee; lunch: tuna fish, 1/2 bagel, frozen meals; soup w/ crackers; supper: fish on Fridays, fast food, pizza; current; feels like she has to eat to make her husband eat.  Current physical activity: little lately, has not had time with her husband's treatment schedule Patient requests to increase dose to help support her with portion management through the holidays and her reported issues with stress eating. Increase Mounjaro to 10 mg weekly.  Scheduled for lab visit with upcoming Twinrix vaccine.   Hyperlipidemia: Controlled per last lab value; current treatment: atorvastatin 20 mg daily;  Previously recommended to continue current  regimen at this time  Topical Allergies Significantly improved; current treatment: levocetirizine 5 mg daily, montelukast 10 mg daily, famotidine 20 mg BID Fioricet PRN headaches related to allergies. Previously advised on minimal use of this agent given abuse potential and [place] list status (as she ages). Reports no use recently.  Previously recommended to continue current regimen at this time  Patient Goals/Self-Care Activities Over the next 90 days, patient will:  - take medications as prescribed target a minimum of 150 minutes of moderate intensity exercise weekly engage in dietary modifications by moderating portion size      Plan: Telephone follow up appointment with care management team member scheduled for:  6 weeks  Catie Feliz Beam, PharmD, [place], CPP Clinical Pharmacist [employer] at [employer] [phone]

## 2021-07-04 ENCOUNTER — Ambulatory Visit (INDEPENDENT_AMBULATORY_CARE_PROVIDER_SITE_OTHER): Payer: BC Managed Care – PPO

## 2021-07-04 ENCOUNTER — Other Ambulatory Visit: Payer: Self-pay

## 2021-07-04 ENCOUNTER — Other Ambulatory Visit (INDEPENDENT_AMBULATORY_CARE_PROVIDER_SITE_OTHER): Payer: BC Managed Care – PPO

## 2021-07-04 DIAGNOSIS — E782 Mixed hyperlipidemia: Secondary | ICD-10-CM

## 2021-07-04 DIAGNOSIS — Z23 Encounter for immunization: Secondary | ICD-10-CM

## 2021-07-04 DIAGNOSIS — E1169 Type 2 diabetes mellitus with other specified complication: Secondary | ICD-10-CM

## 2021-07-04 DIAGNOSIS — E669 Obesity, unspecified: Secondary | ICD-10-CM

## 2021-07-04 LAB — LIPID PANEL
Cholesterol: 158 mg/dL (ref 0–200)
HDL: 74.4 mg/dL (ref >39.00)
LDL Cholesterol: 68 mg/dL (ref 0–99)
NonHDL: 83.21
Total CHOL/HDL Ratio: 2
Triglycerides: 75 mg/dL (ref 0.0–149.0)
VLDL: 15 mg/dL (ref 0.0–40.0)

## 2021-07-04 LAB — COMPREHENSIVE METABOLIC PANEL
ALT: 33 U/L (ref 0–35)
AST: 22 U/L (ref 0–37)
Albumin: 4.3 g/dL (ref 3.5–5.2)
Alkaline Phosphatase: 123 U/L — ABNORMAL HIGH (ref 39–117)
BUN: 17 mg/dL (ref 6–23)
CO2: 29 mEq/L (ref 19–32)
Calcium: 9.4 mg/dL (ref 8.4–10.5)
Chloride: 104 mEq/L (ref 96–112)
Creatinine, Ser: 0.78 mg/dL (ref 0.40–1.20)
GFR: 82.33 mL/min (ref >60.00)
Glucose, Bld: 104 mg/dL — ABNORMAL HIGH (ref 70–99)
Potassium: 4.6 mEq/L (ref 3.5–5.1)
Sodium: 140 mEq/L (ref 135–145)
Total Bilirubin: 1.1 mg/dL (ref 0.2–1.2)
Total Protein: 7.2 g/dL (ref 6.0–8.3)

## 2021-07-04 LAB — HEMOGLOBIN A1C: Hgb A1c MFr Bld: 6.1 % (ref 4.6–6.5)

## 2021-07-04 NOTE — Progress Notes (Signed)
Patient presented for Twinrix injection to right deltoid, patient voiced no concerns nor showed any signs of distress during injection. 

## 2021-07-06 ENCOUNTER — Encounter: Payer: Self-pay | Admitting: Internal Medicine

## 2021-07-09 NOTE — Telephone Encounter (Signed)
Faxed medical record release

## 2021-07-16 ENCOUNTER — Other Ambulatory Visit: Payer: Self-pay | Admitting: Internal Medicine

## 2021-07-23 ENCOUNTER — Encounter: Payer: Self-pay | Admitting: Internal Medicine

## 2021-07-25 ENCOUNTER — Encounter: Payer: Self-pay | Admitting: Internal Medicine

## 2021-07-25 DIAGNOSIS — E1169 Type 2 diabetes mellitus with other specified complication: Secondary | ICD-10-CM

## 2021-07-26 MED ORDER — TIRZEPATIDE 12.5 MG/0.5ML ~~LOC~~ SOAJ: 12.5000 mg | SUBCUTANEOUS | 2 refills | Status: DC

## 2021-08-14 ENCOUNTER — Ambulatory Visit: Payer: BC Managed Care – PPO | Admitting: Pharmacist

## 2021-08-14 ENCOUNTER — Encounter: Payer: Self-pay | Admitting: Internal Medicine

## 2021-08-14 DIAGNOSIS — E1169 Type 2 diabetes mellitus with other specified complication: Secondary | ICD-10-CM

## 2021-08-14 DIAGNOSIS — E669 Obesity, unspecified: Secondary | ICD-10-CM

## 2021-08-14 NOTE — Progress Notes (Addendum)
° °   ° °  Chief Complaint  Patient presents with   Care Coordination    Follow Up    Rachel Riggs is a 61 y.o. year old female who was referred for medication management by their primary care provider, Sherlene Shams, MD. They presented for a telephone visit.  Subjective: Diabetes:  Current medications: metformin 500 mg BID, Mounjaro 12.5 mg weekly  Medications tried in the past: Trulicity, Ozempic - did not tolerate due to GI upset  Tolerating Mounjaro well. Denies GI upset. Denies weight loss, but reports that she is coping with stress with desserts (avoiding alcohol due to liver disease). Aware that decreasing calorie intake is needed for weight loss. Reports she feels relatively well controlled with her mental health right now.   Objective: Lab Results  Component Value Date   HGBA1C 6.1 07/04/2021    Lab Results  Component Value Date   CREATININE 0.78 07/04/2021   BUN 17 07/04/2021   NA 140 07/04/2021   K 4.6 07/04/2021   CL 104 07/04/2021   CO2 29 07/04/2021    Lab Results  Component Value Date   CHOL 158 07/04/2021   HDL 74.40 07/04/2021   LDLCALC 68 07/04/2021   LDLDIRECT 107.0 07/01/2016   TRIG 75.0 07/04/2021   CHOLHDL 2 07/04/2021     Assessment/Plan:   Diabetes: - Currently controlled. Praised for A1c control. - Recommend to continue current regimen at this time.  Advised to be proactive on refilling medication given insurance changes for GLP1 coverage in 2023.     Follow Up Plan: PCP in 3 weeks, PharmD in 12 weeks  Catie Feliz Beam, PharmD, [place], CPP Clinical Pharmacist [facility] at [facility] [phone]   I have reviewed the above information and agree with above.   Duncan Dull, MD

## 2021-08-14 NOTE — Patient Instructions (Signed)
Yalanda,   Keep up the great work! Continue metformin twice daily and Mounjaro 12.5 mg weekly at this time. Let us know if you need anything from [place].   Catie Feliz Beam, PharmD

## 2021-09-04 ENCOUNTER — Other Ambulatory Visit: Payer: Self-pay

## 2021-09-04 ENCOUNTER — Ambulatory Visit: Payer: BC Managed Care – PPO | Admitting: Internal Medicine

## 2021-09-04 ENCOUNTER — Ambulatory Visit
Admission: RE | Admit: 2021-09-04 | Discharge: 2021-09-04 | Disposition: A | Discharge status: Home / Self Care | Payer: BC Managed Care – PPO | Source: Ambulatory Visit | Attending: Internal Medicine | Admitting: Internal Medicine

## 2021-09-04 ENCOUNTER — Ambulatory Visit (INDEPENDENT_AMBULATORY_CARE_PROVIDER_SITE_OTHER): Payer: BC Managed Care – PPO

## 2021-09-04 ENCOUNTER — Encounter: Payer: Self-pay | Admitting: Internal Medicine

## 2021-09-04 VITALS — BP 118/68 | HR 90 | Temp 98.6°F | Ht 67.0 in | Wt 232.2 lb

## 2021-09-04 DIAGNOSIS — R053 Chronic cough: Secondary | ICD-10-CM | POA: Diagnosis not present

## 2021-09-04 DIAGNOSIS — K760 Fatty (change of) liver, not elsewhere classified: Secondary | ICD-10-CM | POA: Diagnosis not present

## 2021-09-04 DIAGNOSIS — Z1231 Encounter for screening mammogram for malignant neoplasm of breast: Secondary | ICD-10-CM | POA: Diagnosis not present

## 2021-09-04 DIAGNOSIS — R059 Cough, unspecified: Secondary | ICD-10-CM | POA: Diagnosis not present

## 2021-09-04 DIAGNOSIS — E669 Obesity, unspecified: Secondary | ICD-10-CM | POA: Diagnosis not present

## 2021-09-04 DIAGNOSIS — E1169 Type 2 diabetes mellitus with other specified complication: Secondary | ICD-10-CM

## 2021-09-04 MED ORDER — MECLIZINE HCL 25 MG PO TABS: 25.0000 mg | ORAL_TABLET | Freq: Three times a day (TID) | ORAL | 0 refills | Status: AC | PRN

## 2021-09-04 NOTE — Assessment & Plan Note (Signed)
Presumed by ultrasound changes and serologies negative for autoimmune causes of hepatitis.  Current liver enzymes are normal and all modifiable risk factors including obesity, diabetes and hyperlipidemia have been addressed  

## 2021-09-04 NOTE — Assessment & Plan Note (Signed)
Improved control with Mounjaro 12.5 mg weekly and metformin. No changes today.  Lab Results  Component Value Date   HGBA1C 6.1 07/04/2021   Lab Results  Component Value Date   MICROALBUR <0.7 11/09/2020   MICROALBUR <0.7 09/12/2019

## 2021-09-04 NOTE — Progress Notes (Signed)
Subjective:  Patient ID: Rachel Riggs, female    DOB: 03/27/1961  Age: 61 y.o. MRN: [id number]  CC: The primary encounter diagnosis was Chronic cough. Diagnoses of Type 2 diabetes mellitus with obesity (HCC) and Hepatic steatosis were also pertinent to this visit.   This visit occurred during the SARS-CoV-2 public health emergency.  Safety protocols were in place, including screening questions prior to the visit, additional usage of staff PPE, and extensive cleaning of exam room while observing appropriate contact time as indicated for disinfecting solutions.    HPI Rachel Riggs presents for  Chief Complaint  Patient presents with   Follow-up    Follow up on weight management   1) Diabetes, obesity; fatty liver:   taking mounjaro and metformin to manage diabetes.  Not focusing on weight loss due to current family issues.  Alcohol abstinent.  LFTS's normalizing    2) chronic cough:  CHEST RAY REQUESTED . Has not smoked in years.  HISTORY of asthma   s 3) Anxiety;  she  is managing her husband's diagnosis of lung cancer,  her fathers's recent diagnosis of NHL, and the sadness of having  to euthanize her beloved dog yesterday,  all while working full time.  Has returned to church and finds the fellowship of the choir and the spiritual support very helpful.  Defers medication at this point , but open to the idea of SSRI therapy in the future.     Outpatient Medications Prior to Visit  Medication Sig Dispense Refill   atorvastatin (LIPITOR) 20 MG tablet TAKE ONE TABLET BY MOUTH ONE TIME DAILY 90 tablet 3   butalbital-acetaminophen-caffeine (FIORICET) 50-325-40 MG tablet TAKE 1 TO 2 TABLETS BY MOUTH EVERY 6 HOURS AS NEEDED FOR HEADACHE 60 tablet 2   EPINEPHrine 0.3 mg/0.3 mL IJ SOAJ injection Inject 0.3 mLs (0.3 mg total) into the muscle as needed for anaphylaxis. 1 each 1   etodolac (LODINE) 500 MG tablet Take 1 tablet (500 mg total) by mouth 2 (two) times daily. 60 tablet 0    famotidine (PEPCID) 20 MG tablet Take 1 tablet (20 mg total) by mouth 2 (two) times daily. 60 tablet 11   levocetirizine (XYZAL) 5 MG tablet Take 5 mg by mouth every evening.     metFORMIN (GLUCOPHAGE) 500 MG tablet Take 1 tablet (500 mg total) by mouth 2 (two) times daily with a meal. 180 tablet 3   montelukast (SINGULAIR) 10 MG tablet TAKE ONE TABLET BY MOUTH AT BEDTIME 30 tablet 3   tirzepatide (MOUNJARO) 12.5 MG/0.5ML Pen Inject 12.5 mg into the skin once a week. 2 mL 2   triamcinolone cream (KENALOG) 0.1 % Apply 1 application topically 2 (two) times daily. To rash until resolved. 85 g 0   VENTOLIN HFA 108 (90 Base) MCG/ACT inhaler INHALE 2 PUFFS BY MOUTH EVERY 6 HOURS AS NEEDED FOR WHEEZING 18 g 0   meclizine (ANTIVERT) 25 MG tablet TAKE 1 TABLET BY MOUTH THREE TIMES DAILY AS NEEDED 90 tablet 0   No facility-administered medications prior to visit.    Review of Systems;  Patient denies headache, fevers, malaise, unintentional weight loss, skin rash, eye pain, sinus congestion and sinus pain, sore throat, dysphagia,  hemoptysis , cough, dyspnea, wheezing, chest pain, palpitations, orthopnea, edema, abdominal pain, nausea, melena, diarrhea, constipation, flank pain, dysuria, hematuria, urinary  Frequency, nocturia, numbness, tingling, seizures,  Focal weakness, Loss of consciousness,  Tremor, insomnia, depression, and suicidal ideation.      Objective:  BP 118/68 (BP Location: Left Arm, Patient Position: Sitting, Cuff Size: Large)    Pulse 90    Temp 98.6 F (37 C) (Oral)    Ht 5\' 7"  (1.702 m)    Wt 232 lb 3.2 oz (105.3 kg)    LMP 01/03/2018    SpO2 99%    BMI 36.37 kg/m   BP Readings from Last 3 Encounters:  09/04/21 118/68  03/28/21 130/78  12/31/20 (!) 134/92    Wt Readings from Last 3 Encounters:  09/04/21 232 lb 3.2 oz (105.3 kg)  03/28/21 230 lb (104.3 kg)  06/08/20 205 lb (93 kg)    General appearance: alert, cooperative and appears stated age Ears: normal TM's and  external ear canals both ears Throat: lips, mucosa, and tongue normal; teeth and gums normal Neck: no adenopathy, no carotid bruit, supple, symmetrical, trachea midline and thyroid not enlarged, symmetric, no tenderness/mass/nodules Back: symmetric, no curvature. ROM normal. No CVA tenderness. Lungs: clear to auscultation bilaterally Heart: regular rate and rhythm, S1, S2 normal, no murmur, click, rub or gallop Abdomen: soft, non-tender; bowel sounds normal; no masses,  no organomegaly Pulses: 2+ and symmetric Skin: Skin color, texture, turgor normal. No rashes or lesions Lymph nodes: Cervical, supraclavicular, and axillary nodes normal. Psych: affect normal, makes good eye contact. No fidgeting,  Smiles easily.  Denies suicidal thoughts    Lab Results  Component Value Date   HGBA1C 6.1 07/04/2021   HGBA1C 6.3 11/09/2020   HGBA1C 9.4 (H) 09/09/2019    Lab Results  Component Value Date   CREATININE 0.78 07/04/2021   CREATININE 0.73 11/09/2020   CREATININE 0.63 09/09/2019    Lab Results  Component Value Date   WBC 9.2 03/25/2019   HGB 11.5 (L) 03/25/2019   HCT 35.6 (L) 03/25/2019   PLT 327.0 03/25/2019   GLUCOSE 104 (H) 07/04/2021   CHOL 158 07/04/2021   TRIG 75.0 07/04/2021   HDL 74.40 07/04/2021   LDLDIRECT 107.0 07/01/2016   LDLCALC 68 07/04/2021   ALT 33 07/04/2021   AST 22 07/04/2021   NA 140 07/04/2021   K 4.6 07/04/2021   CL 104 07/04/2021   CREATININE 0.78 07/04/2021   BUN 17 07/04/2021   CO2 29 07/04/2021   TSH 3.85 03/25/2019   HGBA1C 6.1 07/04/2021   MICROALBUR <0.7 11/09/2020    MM 3D SCREEN BREAST BILATERAL  Result Date: 09/04/2021 CLINICAL DATA:  Screening. EXAM: DIGITAL SCREENING BILATERAL MAMMOGRAM WITH TOMOSYNTHESIS AND CAD TECHNIQUE: Bilateral screening digital craniocaudal and mediolateral oblique mammograms were obtained. Bilateral screening digital breast tomosynthesis was performed. The images were evaluated with computer-aided detection.  COMPARISON:  Previous exam(s). ACR Breast Density Category b: There are scattered areas of fibroglandular density. FINDINGS: There are no findings suspicious for malignancy. IMPRESSION: No mammographic evidence of malignancy. A result letter of this screening mammogram will be mailed directly to the patient. RECOMMENDATION: Screening mammogram in one year. (Code:SM-B-01Y) BI-RADS CATEGORY  1: Negative. Electronically Signed   By: Nolon Nations M.D.   On: 09/04/2021 09:43   Assessment & Plan:   Problem List Items Addressed This Visit     Type 2 diabetes mellitus with obesity ([place])    Improved control with Mounjaro 12.5 mg weekly and metformin. No changes today.  Lab Results  Component Value Date   HGBA1C 6.1 07/04/2021   Lab Results  Component Value Date   MICROALBUR <0.7 11/09/2020   MICROALBUR <0.7 09/12/2019          Hepatic steatosis  Presumed by ultrasound changes and serologies negative for autoimmune causes of hepatitis.  Current liver enzymes are normal and all modifiable risk factors including obesity, diabetes and hyperlipidemia have been addressed       Chronic cough - Primary    Chest xray ordered.  H/o asthma and remote tobacco abuse      Relevant Orders   DG Chest 2 View    I spent 30 minutes dedicated to the care of this patient on the date of this encounter to include pre-visit review of patient's medical history,  most recent imaging studies, Face-to-face time with the patient , and post visit ordering of testing and therapeutics.    Follow-up: No follow-ups on file.   Crecencio Mc, MD

## 2021-09-04 NOTE — Assessment & Plan Note (Signed)
Chest xray ordered.  H/o asthma and remote tobacco abuse

## 2021-09-05 ENCOUNTER — Encounter: Payer: Self-pay | Admitting: Internal Medicine

## 2021-10-08 ENCOUNTER — Ambulatory Visit: Payer: Self-pay | Admitting: Pharmacist

## 2021-10-08 NOTE — Patient Instructions (Signed)
Hi Breniya,  ? ?Unfortunately, I am being asked to quickly transition into another role within the health system, so I am unable to keep our next appointment. Please continue to follow up with your primary care provider as scheduled.  ? ?It has been a pleasure working with you! ? ?Catie Feliz Beam, PharmD ? ?

## 2021-10-08 NOTE — Chronic Care Management (AMB) (Signed)
?  Chronic Care Management  ? ?Note ? ?10/08/2021 ?Name: Rachel Riggs MRN: [id number] DOB: September 10, 1960 ? ? ? ?Closing pharmacy CCM case at this time.  Patient has clinic contact information for future questions or concerns.  ? ?Catie Feliz Beam, PharmD, [place], CPP ?Clinical Pharmacist ?Nature conservation officer at [employer] ?[phone] ? ?

## 2021-10-17 ENCOUNTER — Telehealth: Payer: Self-pay

## 2021-10-17 MED ORDER — TIRZEPATIDE 12.5 MG/0.5ML ~~LOC~~ SOAJ: 12.5000 mg | SUBCUTANEOUS | 2 refills | Status: DC

## 2021-10-28 ENCOUNTER — Telehealth: Payer: Self-pay | Admitting: Internal Medicine

## 2021-10-28 NOTE — Telephone Encounter (Signed)
Pt called in stating that her pharmacy doesn't have medication (tirzepatide (MOUNJARO) 12.5 MG/0.5ML Pen) in stock... Pt was wondering if our office have samples until her pharmacy get medication in stock... Pt requesting callback.Marland Kitchen  ?

## 2021-10-29 NOTE — Telephone Encounter (Signed)
Noted  

## 2021-10-29 NOTE — Telephone Encounter (Signed)
Patient returned office phone call. Note was read and patient is going to try and find at another pharmacy. If she does she will call back. ?

## 2021-10-29 NOTE — Telephone Encounter (Signed)
Lm for pt to cb - no samples of Mounjaro  ?

## 2021-11-01 ENCOUNTER — Telehealth: Payer: BC Managed Care – PPO

## 2021-11-12 ENCOUNTER — Telehealth: Payer: Self-pay

## 2021-11-12 NOTE — Telephone Encounter (Signed)
PA for Mounjaro has been submitted on covermymeds.  

## 2021-11-12 NOTE — Telephone Encounter (Signed)
error 

## 2021-11-13 ENCOUNTER — Other Ambulatory Visit: Payer: Self-pay | Admitting: Internal Medicine

## 2022-01-21 ENCOUNTER — Other Ambulatory Visit: Payer: Self-pay

## 2022-01-21 MED ORDER — TIRZEPATIDE 12.5 MG/0.5ML ~~LOC~~ SOAJ: 12.5000 mg | SUBCUTANEOUS | 2 refills | Status: DC

## 2022-03-03 ENCOUNTER — Telehealth: Payer: BC Managed Care – PPO | Admitting: Physician Assistant

## 2022-03-03 DIAGNOSIS — B9689 Other specified bacterial agents as the cause of diseases classified elsewhere: Secondary | ICD-10-CM

## 2022-03-03 DIAGNOSIS — J208 Acute bronchitis due to other specified organisms: Secondary | ICD-10-CM

## 2022-03-03 MED ORDER — AZITHROMYCIN 250 MG PO TABS: ORAL_TABLET | ORAL | 0 refills | Status: AC

## 2022-03-03 MED ORDER — PREDNISONE 20 MG PO TABS: 20.0000 mg | ORAL_TABLET | Freq: Every day | ORAL | 0 refills | Status: DC

## 2022-03-03 NOTE — Progress Notes (Signed)
Virtual Visit Consent   Rachel Riggs, you are scheduled for a virtual visit with a [facility] provider today. Just as with appointments in the office, your consent must be obtained to participate. Your consent will be active for this visit and any virtual visit you may have with one of our providers in the next 365 days. If you have a MyChart account, a copy of this consent can be sent to you electronically.  As this is a virtual visit, video technology does not allow for your provider to perform a traditional examination. This may limit your provider's ability to fully assess your condition. If your provider identifies any concerns that need to be evaluated in person or the need to arrange testing (such as labs, EKG, etc.), we will make arrangements to do so. Although advances in technology are sophisticated, we cannot ensure that it will always work on either your end or our end. If the connection with a video visit is poor, the visit may have to be switched to a telephone visit. With either a video or telephone visit, we are not always able to ensure that we have a secure connection.  By engaging in this virtual visit, you consent to the provision of healthcare and authorize for your insurance to be billed (if applicable) for the services provided during this visit. Depending on your insurance coverage, you may receive a charge related to this service.  I need to obtain your verbal consent now. Are you willing to proceed with your visit today? ARWA YERO has provided verbal consent on 03/03/2022 for a virtual visit (video or telephone). Margaretann Loveless, PA-C  Date: 03/03/2022 2:30 PM  Virtual Visit via Video Note   I, Margaretann Loveless, connected with  Rachel Riggs  ([id number], [birth year]) on 03/03/22 at  2:30 PM EDT by a video-enabled telemedicine application and verified that I am speaking with the correct person using two identifiers.  Location: Patient: Virtual Visit  Location Patient: Home Provider: Virtual Visit Location Provider: Home Office   I discussed the limitations of evaluation and management by telemedicine and the availability of in person appointments. The patient expressed understanding and agreed to proceed.    History of Present Illness: Rachel Riggs is a 61 y.o. who identifies as a female who was assigned female at birth, and is being seen today for cough.  HPI: Cough This is a new problem. The current episode started in the past 7 days. The problem has been gradually worsening. The problem occurs constantly. The cough is Productive of sputum and productive of purulent sputum. Associated symptoms include chest pain (heaviness) and wheezing. Pertinent negatives include no myalgias, nasal congestion, postnasal drip or rhinorrhea. The treatment provided no relief. Her past medical history is significant for asthma and bronchitis.   Husband has stage 4 lung cancer.   Problems:  Patient Active Problem List   Diagnosis Date Noted   Chronic cough 09/04/2021   Post-menopausal bleeding 02/22/2019   Contact with and (suspected) exposure to covid-19 02/22/2019   Aspirin contraindicated 08/05/2016   Generalized anxiety disorder 06/26/2015   Anemia, iron deficiency 09/14/2014   Hives of unknown origin 09/11/2014   Hepatic steatosis 12/22/2013   Leukocytosis, unspecified 12/22/2013   Type 2 diabetes mellitus with obesity (HCC) 12/21/2013   Encounter for preventive health examination 10/13/2012   Screening for cervical cancer 10/06/2012   Allergic rhinitis 09/08/2012   History of asthma 09/07/2012   Hyperlipidemia 09/07/2012   Obesity 09/07/2012  Family history of cervical cancer 09/07/2012    Allergies:  Allergies  Allergen Reactions   Amoxicillin Anaphylaxis   Aspirin Anaphylaxis   Ibuprofen Anaphylaxis   Penicillins Anaphylaxis   Sulfa Antibiotics Anaphylaxis   Medications:  Current Outpatient Medications:    azithromycin  (ZITHROMAX) 250 MG tablet, Take 2 tablets on day 1, then 1 tablet daily on days 2 through 5, Disp: 6 tablet, Rfl: 0   predniSONE (DELTASONE) 20 MG tablet, Take 1 tablet (20 mg total) by mouth daily with breakfast., Disp: 5 tablet, Rfl: 0   atorvastatin (LIPITOR) 20 MG tablet, TAKE ONE TABLET BY MOUTH ONE TIME DAILY, Disp: 90 tablet, Rfl: 3   butalbital-acetaminophen-caffeine (FIORICET) 50-325-40 MG tablet, TAKE 1 TO 2 TABLETS BY MOUTH EVERY 6 HOURS AS NEEDED FOR HEADACHE, Disp: 60 tablet, Rfl: 2   EPINEPHrine 0.3 mg/0.3 mL IJ SOAJ injection, Inject 0.3 mLs (0.3 mg total) into the muscle as needed for anaphylaxis., Disp: 1 each, Rfl: 1   etodolac (LODINE) 500 MG tablet, Take 1 tablet (500 mg total) by mouth 2 (two) times daily., Disp: 60 tablet, Rfl: 0   famotidine (PEPCID) 20 MG tablet, Take 1 tablet (20 mg total) by mouth 2 (two) times daily., Disp: 60 tablet, Rfl: 11   levocetirizine (XYZAL) 5 MG tablet, Take 5 mg by mouth every evening., Disp: , Rfl:    meclizine (ANTIVERT) 25 MG tablet, Take 1 tablet (25 mg total) by mouth 3 (three) times daily as needed., Disp: 90 tablet, Rfl: 0   metFORMIN (GLUCOPHAGE) 500 MG tablet, Take 1 tablet (500 mg total) by mouth 2 (two) times daily with a meal., Disp: 180 tablet, Rfl: 3   montelukast (SINGULAIR) 10 MG tablet, TAKE ONE TABLET BY MOUTH AT BEDTIME, Disp: 30 tablet, Rfl: 3   tirzepatide (MOUNJARO) 12.5 MG/0.5ML Pen, Inject 12.5 mg into the skin once a week., Disp: 2 mL, Rfl: 2   triamcinolone cream (KENALOG) 0.1 %, Apply 1 application topically 2 (two) times daily. To rash until resolved., Disp: 85 g, Rfl: 0   VENTOLIN HFA 108 (90 Base) MCG/ACT inhaler, INHALE 2 PUFFS BY MOUTH EVERY 6 HOURS AS NEEDED FOR WHEEZING, Disp: 18 g, Rfl: 0  Observations/Objective: Patient is well-developed, well-nourished in no acute distress.  Resting comfortably at home.  Head is normocephalic, atraumatic.  No labored breathing.  Speech is clear and coherent with logical  content.  Patient is alert and oriented at baseline.    Assessment and Plan: 1. Acute bacterial bronchitis - azithromycin (ZITHROMAX) 250 MG tablet; Take 2 tablets on day 1, then 1 tablet daily on days 2 through 5  Dispense: 6 tablet; Refill: 0 - predniSONE (DELTASONE) 20 MG tablet; Take 1 tablet (20 mg total) by mouth daily with breakfast.  Dispense: 5 tablet; Refill: 0  - Worsening over a week despite OTC medications - Will treat with Z-pack and prednisone - Can continue Mucinex and tessalon perles as needed - Continue inhalers as prescribed - Push fluids.  - Rest.  - Steam and humidifier can help - Seek in person evaluation if worsening or symptoms fail to improve    Follow Up Instructions: I discussed the assessment and treatment plan with the patient. The patient was provided an opportunity to ask questions and all were answered. The patient agreed with the plan and demonstrated an understanding of the instructions.  A copy of instructions were sent to the patient via MyChart unless otherwise noted below.    The patient was advised to  call back or seek an in-person evaluation if the symptoms worsen or if the condition fails to improve as anticipated.  Time:  I spent 8 minutes with the patient via telehealth technology discussing the above problems/concerns.    Margaretann Loveless, PA-C

## 2022-03-03 NOTE — Patient Instructions (Signed)
Rachel Riggs, thank you for joining Margaretann Loveless, PA-C for today's virtual visit.  While this provider is not your primary care provider (PCP), if your PCP is located in our provider database this encounter information will be shared with them immediately following your visit.  Consent: (Patient) Rachel Riggs provided verbal consent for this virtual visit at the beginning of the encounter.  Current Medications:  Current Outpatient Medications:    azithromycin (ZITHROMAX) 250 MG tablet, Take 2 tablets on day 1, then 1 tablet daily on days 2 through 5, Disp: 6 tablet, Rfl: 0   predniSONE (DELTASONE) 20 MG tablet, Take 1 tablet (20 mg total) by mouth daily with breakfast., Disp: 5 tablet, Rfl: 0   atorvastatin (LIPITOR) 20 MG tablet, TAKE ONE TABLET BY MOUTH ONE TIME DAILY, Disp: 90 tablet, Rfl: 3   butalbital-acetaminophen-caffeine (FIORICET) 50-325-40 MG tablet, TAKE 1 TO 2 TABLETS BY MOUTH EVERY 6 HOURS AS NEEDED FOR HEADACHE, Disp: 60 tablet, Rfl: 2   EPINEPHrine 0.3 mg/0.3 mL IJ SOAJ injection, Inject 0.3 mLs (0.3 mg total) into the muscle as needed for anaphylaxis., Disp: 1 each, Rfl: 1   etodolac (LODINE) 500 MG tablet, Take 1 tablet (500 mg total) by mouth 2 (two) times daily., Disp: 60 tablet, Rfl: 0   famotidine (PEPCID) 20 MG tablet, Take 1 tablet (20 mg total) by mouth 2 (two) times daily., Disp: 60 tablet, Rfl: 11   levocetirizine (XYZAL) 5 MG tablet, Take 5 mg by mouth every evening., Disp: , Rfl:    meclizine (ANTIVERT) 25 MG tablet, Take 1 tablet (25 mg total) by mouth 3 (three) times daily as needed., Disp: 90 tablet, Rfl: 0   metFORMIN (GLUCOPHAGE) 500 MG tablet, Take 1 tablet (500 mg total) by mouth 2 (two) times daily with a meal., Disp: 180 tablet, Rfl: 3   montelukast (SINGULAIR) 10 MG tablet, TAKE ONE TABLET BY MOUTH AT BEDTIME, Disp: 30 tablet, Rfl: 3   tirzepatide (MOUNJARO) 12.5 MG/0.5ML Pen, Inject 12.5 mg into the skin once a week., Disp: 2 mL, Rfl: 2    triamcinolone cream (KENALOG) 0.1 %, Apply 1 application topically 2 (two) times daily. To rash until resolved., Disp: 85 g, Rfl: 0   VENTOLIN HFA 108 (90 Base) MCG/ACT inhaler, INHALE 2 PUFFS BY MOUTH EVERY 6 HOURS AS NEEDED FOR WHEEZING, Disp: 18 g, Rfl: 0   Medications ordered in this encounter:  Meds ordered this encounter  Medications   azithromycin (ZITHROMAX) 250 MG tablet    Sig: Take 2 tablets on day 1, then 1 tablet daily on days 2 through 5    Dispense:  6 tablet    Refill:  0    Order Specific Question:   Supervising Provider    Answer:   Hyacinth Meeker, BRIAN [[id number]]   predniSONE (DELTASONE) 20 MG tablet    Sig: Take 1 tablet (20 mg total) by mouth daily with breakfast.    Dispense:  5 tablet    Refill:  0    Order Specific Question:   Supervising Provider    Answer:   Hyacinth Meeker, BRIAN [[id number]]     *If you need refills on other medications prior to your next appointment, please contact your pharmacy*  Follow-Up: Call back or seek an in-person evaluation if the symptoms worsen or if the condition fails to improve as anticipated.  Other Instructions Acute Bronchitis, Adult  Acute bronchitis is sudden inflammation of the main airways (bronchi) that come off the windpipe (trachea) in the  lungs. The swelling causes the airways to get smaller and make more mucus than normal. This can make it hard to breathe and can cause coughing or noisy breathing (wheezing). Acute bronchitis may last several weeks. The cough may last longer. Allergies, asthma, and exposure to smoke may make the condition worse. What are the causes? This condition can be caused by germs and by substances that irritate the lungs, including: Cold and flu viruses. The most common cause of this condition is the virus that causes the common cold. Bacteria. This is less common. Breathing in substances that irritate the lungs, including: Smoke from cigarettes and other forms of tobacco. Dust and pollen. Fumes from household  cleaning products, gases, or burned fuel. Indoor or outdoor air pollution. What increases the risk? The following factors may make you more likely to develop this condition: A weak body's defense system, also called the immune system. A condition that affects your lungs and breathing, such as asthma. What are the signs or symptoms? Common symptoms of this condition include: Coughing. This may bring up clear, yellow, or green mucus from your lungs (sputum). Wheezing. Runny or stuffy nose. Having too much mucus in your lungs (chest congestion). Shortness of breath. Aches and pains, including sore throat or chest. How is this diagnosed? This condition is usually diagnosed based on: Your symptoms and medical history. A physical exam. You may also have other tests, including tests to rule out other conditions, such as pneumonia. These tests include: A test of lung function. Test of a mucus sample to look for the presence of bacteria. Tests to check the oxygen level in your blood. Blood tests. Chest X-ray. How is this treated? Most cases of acute bronchitis clear up over time without treatment. Your health care provider may recommend: Drinking more fluids to help thin your mucus so it is easier to cough up. Taking inhaled medicine (inhaler) to improve air flow in and out of your lungs. Using a vaporizer or a humidifier. These are machines that add water to the air to help you breathe better. Taking a medicine that thins mucus and clears congestion (expectorant). Taking a medicine that prevents or stops coughing (cough suppressant). It is notcommon to take an antibiotic medicine for this condition. Follow these instructions at home:  Take over-the-counter and prescription medicines only as told by your health care provider. Use an inhaler, vaporizer, or humidifier as told by your health care provider. Take two teaspoons (10 mL) of honey at bedtime to lessen coughing at night. Drink  enough fluid to keep your urine pale yellow. Do not use any products that contain nicotine or tobacco. These products include cigarettes, chewing tobacco, and vaping devices, such as e-cigarettes. If you need help quitting, ask your health care provider. Get plenty of rest. Return to your normal activities as told by your health care provider. Ask your health care provider what activities are safe for you. Keep all follow-up visits. This is important. How is this prevented? To lower your risk of getting this condition again: Wash your hands often with soap and water for at least 20 seconds. If soap and water are not available, use hand sanitizer. Avoid contact with people who have cold symptoms. Try not to touch your mouth, nose, or eyes with your hands. Avoid breathing in smoke or chemical fumes. Breathing smoke or chemical fumes will make your condition worse. Get the flu shot every year. Contact a health care provider if: Your symptoms do not improve after 2  weeks. You have trouble coughing up the mucus. Your cough keeps you awake at night. You have a fever. Get help right away if you: Cough up blood. Feel pain in your chest. Have severe shortness of breath. Faint or keep feeling like you are going to faint. Have a severe headache. Have a fever or chills that get worse. These symptoms may represent a serious problem that is an emergency. Do not wait to see if the symptoms will go away. Get medical help right away. Call your local emergency services (911 in the [place] Do not drive yourself to the hospital. Summary Acute bronchitis is inflammation of the main airways (bronchi) that come off the windpipe (trachea) in the lungs. The swelling causes the airways to get smaller and make more mucus than normal. Drinking more fluids can help thin your mucus so it is easier to cough up. Take over-the-counter and prescription medicines only as told by your health care provider. Do not use any  products that contain nicotine or tobacco. These products include cigarettes, chewing tobacco, and vaping devices, such as e-cigarettes. If you need help quitting, ask your health care provider. Contact a health care provider if your symptoms do not improve after 2 weeks. This information is not intended to replace advice given to you by your health care provider. Make sure you discuss any questions you have with your health care provider. Document Revised: 11/21/2020 Document Reviewed: 11/21/2020 Elsevier Patient Education  2023 Elsevier Inc.    If you have been instructed to have an in-person evaluation today at a local Urgent Care facility, please use the link below. It will take you to a list of all of our available [facility], including address, phone number and hours of operation. Please do not delay care.  [facility]  If you or a family member do not have a primary care provider, use the link below to schedule a visit and establish care. When you choose a [facility] primary care physician or advanced practice provider, you gain a long-term partner in health. Find a Primary Care Provider  Learn more about [facility]'s in-office and virtual care options: [facility] - Get Care Now

## 2022-03-04 ENCOUNTER — Encounter: Payer: Self-pay | Admitting: Physician Assistant

## 2022-03-04 MED ORDER — BENZONATATE 100 MG PO CAPS: 100.0000 mg | ORAL_CAPSULE | Freq: Three times a day (TID) | ORAL | 0 refills | Status: DC | PRN

## 2022-03-13 ENCOUNTER — Other Ambulatory Visit: Payer: Self-pay | Admitting: Internal Medicine

## 2022-04-10 ENCOUNTER — Other Ambulatory Visit: Payer: Self-pay | Admitting: Internal Medicine

## 2022-05-12 ENCOUNTER — Other Ambulatory Visit: Payer: Self-pay | Admitting: Internal Medicine

## 2022-05-12 DIAGNOSIS — E1169 Type 2 diabetes mellitus with other specified complication: Secondary | ICD-10-CM

## 2022-07-09 ENCOUNTER — Ambulatory Visit (INDEPENDENT_AMBULATORY_CARE_PROVIDER_SITE_OTHER): Payer: BC Managed Care – PPO | Admitting: Internal Medicine

## 2022-07-09 ENCOUNTER — Encounter: Payer: Self-pay | Admitting: Internal Medicine

## 2022-07-09 ENCOUNTER — Other Ambulatory Visit (HOSPITAL_COMMUNITY)
Admission: RE | Admit: 2022-07-09 | Discharge: 2022-07-09 | Disposition: A | Discharge status: Home / Self Care | Payer: BC Managed Care – PPO | Source: Ambulatory Visit | Attending: Internal Medicine | Admitting: Internal Medicine

## 2022-07-09 VITALS — BP 130/74 | HR 84 | Temp 97.9°F | Ht 67.0 in | Wt 227.0 lb

## 2022-07-09 DIAGNOSIS — Z23 Encounter for immunization: Secondary | ICD-10-CM | POA: Diagnosis not present

## 2022-07-09 DIAGNOSIS — Z124 Encounter for screening for malignant neoplasm of cervix: Secondary | ICD-10-CM | POA: Diagnosis not present

## 2022-07-09 DIAGNOSIS — E1169 Type 2 diabetes mellitus with other specified complication: Secondary | ICD-10-CM

## 2022-07-09 DIAGNOSIS — Z1211 Encounter for screening for malignant neoplasm of colon: Secondary | ICD-10-CM

## 2022-07-09 DIAGNOSIS — E782 Mixed hyperlipidemia: Secondary | ICD-10-CM | POA: Diagnosis not present

## 2022-07-09 DIAGNOSIS — Z1231 Encounter for screening mammogram for malignant neoplasm of breast: Secondary | ICD-10-CM

## 2022-07-09 DIAGNOSIS — D508 Other iron deficiency anemias: Secondary | ICD-10-CM | POA: Diagnosis not present

## 2022-07-09 DIAGNOSIS — E669 Obesity, unspecified: Secondary | ICD-10-CM

## 2022-07-09 DIAGNOSIS — Z114 Encounter for screening for human immunodeficiency virus [HIV]: Secondary | ICD-10-CM | POA: Diagnosis not present

## 2022-07-09 DIAGNOSIS — Z0000 Encounter for general adult medical examination without abnormal findings: Secondary | ICD-10-CM | POA: Diagnosis not present

## 2022-07-09 DIAGNOSIS — Z634 Disappearance and death of family member: Secondary | ICD-10-CM

## 2022-07-09 DIAGNOSIS — Z79899 Other long term (current) drug therapy: Secondary | ICD-10-CM

## 2022-07-09 LAB — COMPREHENSIVE METABOLIC PANEL
ALT: 37 U/L — ABNORMAL HIGH (ref 0–35)
AST: 35 U/L (ref 0–37)
Albumin: 4.7 g/dL (ref 3.5–5.2)
Alkaline Phosphatase: 109 U/L (ref 39–117)
BUN: 12 mg/dL (ref 6–23)
CO2: 28 mEq/L (ref 19–32)
Calcium: 9.6 mg/dL (ref 8.4–10.5)
Chloride: 102 mEq/L (ref 96–112)
Creatinine, Ser: 0.77 mg/dL (ref 0.40–1.20)
GFR: 83.02 mL/min (ref >60.00)
Glucose, Bld: 87 mg/dL (ref 70–99)
Potassium: 4.5 mEq/L (ref 3.5–5.1)
Sodium: 140 mEq/L (ref 135–145)
Total Bilirubin: 1.1 mg/dL (ref 0.2–1.2)
Total Protein: 7.1 g/dL (ref 6.0–8.3)

## 2022-07-09 LAB — CBC WITH DIFFERENTIAL/PLATELET
Basophils Absolute: 0 10*3/uL (ref 0.0–0.1)
Basophils Relative: 0.5 % (ref 0.0–3.0)
Eosinophils Absolute: 0.3 10*3/uL (ref 0.0–0.7)
Eosinophils Relative: 4.9 % (ref 0.0–5.0)
HCT: 39.8 % (ref 36.0–46.0)
Hemoglobin: 13 g/dL (ref 12.0–15.0)
Lymphocytes Relative: 38.7 % (ref 12.0–46.0)
Lymphs Abs: 2.3 10*3/uL (ref 0.7–4.0)
MCHC: 32.7 g/dL (ref 30.0–36.0)
MCV: 84.1 fl (ref 78.0–100.0)
Monocytes Absolute: 0.5 10*3/uL (ref 0.1–1.0)
Monocytes Relative: 9 % (ref 3.0–12.0)
Neutro Abs: 2.8 10*3/uL (ref 1.4–7.7)
Neutrophils Relative %: 46.9 % (ref 43.0–77.0)
Platelets: 290 10*3/uL (ref 150.0–400.0)
RBC: 4.73 Mil/uL (ref 3.87–5.11)
RDW: 15.8 % — ABNORMAL HIGH (ref 11.5–15.5)
WBC: 6 10*3/uL (ref 4.0–10.5)

## 2022-07-09 LAB — LIPID PANEL
Cholesterol: 165 mg/dL (ref 0–200)
HDL: 77.4 mg/dL (ref >39.00)
LDL Cholesterol: 67 mg/dL (ref 0–99)
NonHDL: 87.66
Total CHOL/HDL Ratio: 2
Triglycerides: 101 mg/dL (ref 0.0–149.0)
VLDL: 20.2 mg/dL (ref 0.0–40.0)

## 2022-07-09 LAB — MICROALBUMIN / CREATININE URINE RATIO
Creatinine,U: 148.3 mg/dL
Microalb Creat Ratio: 1 mg/g (ref 0.0–30.0)
Microalb, Ur: 1.4 mg/dL (ref 0.0–1.9)

## 2022-07-09 LAB — LDL CHOLESTEROL, DIRECT: Direct LDL: 77 mg/dL

## 2022-07-09 LAB — HEMOGLOBIN A1C: Hgb A1c MFr Bld: 6 % (ref 4.6–6.5)

## 2022-07-09 LAB — TSH: TSH: 2.81 u[IU]/mL (ref 0.35–5.50)

## 2022-07-09 MED ORDER — EPINEPHRINE 0.3 MG/0.3ML IJ SOAJ: 0.3000 mg | INTRAMUSCULAR | 1 refills | Status: AC | PRN

## 2022-07-09 MED ORDER — MOUNJARO 12.5 MG/0.5ML ~~LOC~~ SOAJ: SUBCUTANEOUS | 2 refills | Status: DC

## 2022-07-09 NOTE — Patient Instructions (Addendum)
You received your ten year Tetanus booster vaccine today  Mammogram has been ordered  Your annual mammogram has been ordered  and is due now.  You can call [facility] and schedule it in February 36 [phone]    Referral for colonoscopy has been ordered

## 2022-07-09 NOTE — Assessment & Plan Note (Signed)
She has been lost to follow up for over a year due to her husband's battle with colon CA he passed 62 days ago

## 2022-07-09 NOTE — Progress Notes (Unsigned)
The patient is here for annual preventive examination and management of other chronic and acute problems.   The risk factors are reflected in the social history.   The roster of all physicians providing medical care to patient - is listed in the Snapshot section of the chart.   Activities of daily living:  The patient is 100% independent in all ADLs: dressing, toileting, feeding as well as independent mobility   Home safety : The patient has smoke detectors in the home. They wear seatbelts.  There are no unsecured firearms at home. There is no violence in the home.    There is no risks for hepatitis, STDs or HIV. There is no   history of blood transfusion. They have no travel history to infectious disease endemic areas of the world.   The patient has seen their dentist in the last six month. They have seen their eye doctor in the last year. The patinet  denies slight hearing difficulty with regard to whispered voices and some television programs.  They have deferred audiologic testing in the last year.  They do not  have excessive sun exposure. Discussed the need for sun protection: hats, long sleeves and use of sunscreen if there is significant sun exposure.    Diet: the importance of a healthy diet is discussed. They do have a healthy diet.   The benefits of regular aerobic exercise were discussed. The patient  exercises  3 to 5 days per week  for  60 minutes.    Depression screen: there are no signs or vegative symptoms of depression- irritability, change in appetite, anhedonia, sadness/tearfullness.   The following portions of the patient's history were reviewed and updated as appropriate: allergies, current medications, past family history, past medical history,  past surgical history, past social history  and problem list.   Visual acuity was not assessed per patient preference since the patient has regular follow up with an  ophthalmologist. Hearing and body mass index were assessed and  reviewed.    During the course of the visit the patient was educated and counseled about appropriate screening and preventive services including : fall prevention , diabetes screening, nutrition counseling, colorectal cancer screening, and recommended immunizations.    Chief Complaint:  HPI     Annual Exam    Additional comments: Physical with pap smear      Last edited by Sandy Salaam, CMA on 07/09/2022  9:40 AM.        Review of Symptoms  Patient denies headache, fevers, malaise, unintentional weight loss, skin rash, eye pain, sinus congestion and sinus pain, sore throat, dysphagia,  hemoptysis , cough, dyspnea, wheezing, chest pain, palpitations, orthopnea, edema, abdominal pain, nausea, melena, diarrhea, constipation, flank pain, dysuria, hematuria, urinary  Frequency, nocturia, numbness, tingling, seizures,  Focal weakness, Loss of consciousness,  Tremor, insomnia, depression, anxiety, and suicidal ideation.    Physical Exam:  BP 130/74   Pulse 84   Temp 97.9 F (36.6 C) (Oral)   Ht 5\' 7"  (1.702 m)   Wt 227 lb (103 kg)   LMP 01/03/2018   SpO2 99%   BMI 35.55 kg/m      Assessment and Plan:  No problem-specific Assessment & Plan notes found for this encounter.   Updated Medication List Outpatient Encounter Medications as of 07/09/2022  Medication Sig   atorvastatin (LIPITOR) 20 MG tablet TAKE ONE TABLET BY MOUTH ONE TIME DAILY   butalbital-acetaminophen-caffeine (FIORICET) 50-325-40 MG tablet TAKE 1 TO 2 TABLETS BY MOUTH  EVERY 6 HOURS AS NEEDED FOR HEADACHE   etodolac (LODINE) 500 MG tablet Take 1 tablet (500 mg total) by mouth 2 (two) times daily.   famotidine (PEPCID) 20 MG tablet Take 1 tablet (20 mg total) by mouth 2 (two) times daily.   levocetirizine (XYZAL) 5 MG tablet Take 5 mg by mouth every evening.   meclizine (ANTIVERT) 25 MG tablet Take 1 tablet (25 mg total) by mouth 3 (three) times daily as needed.   metFORMIN (GLUCOPHAGE) 500 MG tablet TAKE ONE  TABLET BY MOUTH TWICE A DAY WITH A MEAL   montelukast (SINGULAIR) 10 MG tablet TAKE ONE TABLET BY MOUTH AT BEDTIME   triamcinolone cream (KENALOG) 0.1 % Apply 1 application topically 2 (two) times daily. To rash until resolved.   VENTOLIN HFA 108 (90 Base) MCG/ACT inhaler INHALE 2 PUFFS BY MOUTH EVERY 6 HOURS AS NEEDED FOR WHEEZING   [DISCONTINUED] EPINEPHrine 0.3 mg/0.3 mL IJ SOAJ injection Inject 0.3 mLs (0.3 mg total) into the muscle as needed for anaphylaxis.   [DISCONTINUED] MOUNJARO 12.5 MG/0.5ML Pen INJECT THE CONTENTS OF ONE PEN UNDER THE SKIN WEEKLY ON THE SAME DAY EACH WEEK   EPINEPHrine 0.3 mg/0.3 mL IJ SOAJ injection Inject 0.3 mg into the muscle as needed for anaphylaxis.   tirzepatide (MOUNJARO) 12.5 MG/0.5ML Pen INJECT THE CONTENTS OF ONE PEN UNDER THE SKIN WEEKLY ON THE SAME DAY EACH WEEK   [DISCONTINUED] benzonatate (TESSALON) 100 MG capsule Take 1 capsule (100 mg total) by mouth 3 (three) times daily as needed for cough. (Patient not taking: Reported on 07/09/2022)   [DISCONTINUED] predniSONE (DELTASONE) 20 MG tablet Take 1 tablet (20 mg total) by mouth daily with breakfast. (Patient not taking: Reported on 07/09/2022)   No facility-administered encounter medications on file as of 07/09/2022.

## 2022-07-10 DIAGNOSIS — Z634 Disappearance and death of family member: Secondary | ICD-10-CM | POA: Insufficient documentation

## 2022-07-10 LAB — IRON,TIBC AND FERRITIN PANEL
%SAT: 14 % (calc) — ABNORMAL LOW (ref 16–45)
Ferritin: 47 ng/mL (ref 16–288)
Iron: 55 ug/dL (ref 45–160)
TIBC: 385 mcg/dL (calc) (ref 250–450)

## 2022-07-10 LAB — CYTOLOGY - PAP
Comment: NEGATIVE
Diagnosis: NEGATIVE
High risk HPV: NEGATIVE

## 2022-07-10 LAB — HIV ANTIBODY (ROUTINE TESTING W REFLEX): HIV 1&2 Ab, 4th Generation: NONREACTIVE

## 2022-07-10 NOTE — Assessment & Plan Note (Signed)
Patient is dealing with the  loss of spouse and has adequate coping skills and emotional support .  i have asked patient to return in one month to   address her medical issues that have been deferred during the last year due to his battle with CA

## 2022-07-10 NOTE — Assessment & Plan Note (Signed)

## 2022-07-14 ENCOUNTER — Telehealth: Payer: Self-pay

## 2022-07-14 ENCOUNTER — Other Ambulatory Visit: Payer: Self-pay

## 2022-07-14 DIAGNOSIS — Z1211 Encounter for screening for malignant neoplasm of colon: Secondary | ICD-10-CM

## 2022-07-14 MED ORDER — CLENPIQ 10-3.5-12 MG-GM -GM/175ML PO SOLN: 175.0000 mL | Freq: Once | ORAL | 0 refills | Status: AC

## 2022-07-14 NOTE — Telephone Encounter (Signed)
Gastroenterology Pre-Procedure Review  Request Date: 09/12/2021 Requesting Physician: Dr. Allegra Lai  PATIENT REVIEW QUESTIONS: The patient responded to the following health history questions as indicated:    1. Are you having any GI issues? no 2. Do you have a personal history of Polyps? no 3. Do you have a family history of Colon Cancer or Polyps? no 4. Diabetes Mellitus? Yes metformin and Mounjaro 5. Joint replacements in the past 12 months?no 6. Major health problems in the past 3 months?no 7. Any artificial heart valves, MVP, or defibrillator?no    MEDICATIONS & ALLERGIES:    Patient reports the following regarding taking any anticoagulation/antiplatelet therapy:   Plavix, Coumadin, Eliquis, Xarelto, Lovenox, Pradaxa, Brilinta, or Effient? no Aspirin? no  Patient confirms/reports the following medications:  Current Outpatient Medications  Medication Sig Dispense Refill   atorvastatin (LIPITOR) 20 MG tablet TAKE ONE TABLET BY MOUTH ONE TIME DAILY 90 tablet 3   butalbital-acetaminophen-caffeine (FIORICET) 50-325-40 MG tablet TAKE 1 TO 2 TABLETS BY MOUTH EVERY 6 HOURS AS NEEDED FOR HEADACHE 60 tablet 2   EPINEPHrine 0.3 mg/0.3 mL IJ SOAJ injection Inject 0.3 mg into the muscle as needed for anaphylaxis. 1 each 1   etodolac (LODINE) 500 MG tablet Take 1 tablet (500 mg total) by mouth 2 (two) times daily. 60 tablet 0   famotidine (PEPCID) 20 MG tablet Take 1 tablet (20 mg total) by mouth 2 (two) times daily. 60 tablet 11   levocetirizine (XYZAL) 5 MG tablet Take 5 mg by mouth every evening.     meclizine (ANTIVERT) 25 MG tablet Take 1 tablet (25 mg total) by mouth 3 (three) times daily as needed. 90 tablet 0   metFORMIN (GLUCOPHAGE) 500 MG tablet TAKE ONE TABLET BY MOUTH TWICE A DAY WITH A MEAL 180 tablet 0   montelukast (SINGULAIR) 10 MG tablet TAKE ONE TABLET BY MOUTH AT BEDTIME 90 tablet 3   tirzepatide (MOUNJARO) 12.5 MG/0.5ML Pen INJECT THE CONTENTS OF ONE PEN UNDER THE SKIN WEEKLY ON  THE SAME DAY EACH WEEK 2 mL 2   triamcinolone cream (KENALOG) 0.1 % Apply 1 application topically 2 (two) times daily. To rash until resolved. 85 g 0   VENTOLIN HFA 108 (90 Base) MCG/ACT inhaler INHALE 2 PUFFS BY MOUTH EVERY 6 HOURS AS NEEDED FOR WHEEZING 18 g 0   No current facility-administered medications for this visit.    Patient confirms/reports the following allergies:  Allergies  Allergen Reactions   Amoxicillin Anaphylaxis   Aspirin Anaphylaxis   Ibuprofen Anaphylaxis   Penicillins Anaphylaxis   Sulfa Antibiotics Anaphylaxis    No orders of the defined types were placed in this encounter.   AUTHORIZATION INFORMATION Primary Insurance: 1D#: Group #:  Secondary Insurance: 1D#: Group #:  SCHEDULE INFORMATION: Date:  Time: Location:

## 2022-07-18 ENCOUNTER — Telehealth: Payer: Self-pay

## 2022-07-18 NOTE — Telephone Encounter (Signed)
Pt called in stating Tuesday and Wednesday she felt sick had a fever and on today she tested for COVID at home and it was positive. Pt states she is feeling better thans he did Monday and Tuesday no real sx's she is curious how long before she is no longer contagious/ she have to quarantine?   Pt would like a CB on her cell

## 2022-07-18 NOTE — Telephone Encounter (Signed)
After speaking with Dr. Darrick Huntsman I called the pt to let her know that the quarantine time is now 5 days from the day of symptom onset as long as her symptoms are improving and she does not have a fever. Pt gave a verbal understanding.

## 2022-08-09 ENCOUNTER — Encounter: Payer: Self-pay | Admitting: Internal Medicine

## 2022-08-10 ENCOUNTER — Other Ambulatory Visit: Payer: Self-pay | Admitting: Internal Medicine

## 2022-08-20 DIAGNOSIS — E119 Type 2 diabetes mellitus without complications: Secondary | ICD-10-CM | POA: Diagnosis not present

## 2022-08-20 LAB — HM DIABETES EYE EXAM

## 2022-09-05 ENCOUNTER — Ambulatory Visit
Admission: RE | Admit: 2022-09-05 | Discharge: 2022-09-05 | Disposition: A | Discharge status: Home / Self Care | Payer: BC Managed Care – PPO | Source: Ambulatory Visit | Attending: Internal Medicine | Admitting: Internal Medicine

## 2022-09-05 DIAGNOSIS — Z1231 Encounter for screening mammogram for malignant neoplasm of breast: Secondary | ICD-10-CM | POA: Diagnosis not present

## 2022-09-09 ENCOUNTER — Encounter: Payer: Self-pay | Admitting: Internal Medicine

## 2022-09-09 ENCOUNTER — Other Ambulatory Visit: Payer: Self-pay | Admitting: Internal Medicine

## 2022-09-09 DIAGNOSIS — R928 Other abnormal and inconclusive findings on diagnostic imaging of breast: Secondary | ICD-10-CM

## 2022-09-09 DIAGNOSIS — N6489 Other specified disorders of breast: Secondary | ICD-10-CM

## 2022-09-09 NOTE — Telephone Encounter (Signed)
This is in regards to her mammogram results.

## 2022-09-10 NOTE — Telephone Encounter (Signed)
Patient called and said that Rachel Riggs can see the orders, but Dr Derrel Nip has not approved.

## 2022-09-10 NOTE — Telephone Encounter (Signed)
Orders need to be cosigned.

## 2022-09-11 ENCOUNTER — Ambulatory Visit
Admission: RE | Admit: 2022-09-11 | Discharge: 2022-09-11 | Disposition: A | Discharge status: Home / Self Care | Payer: BC Managed Care – PPO | Source: Ambulatory Visit | Attending: Internal Medicine | Admitting: Internal Medicine

## 2022-09-11 ENCOUNTER — Encounter: Payer: Self-pay | Admitting: Gastroenterology

## 2022-09-11 DIAGNOSIS — N6489 Other specified disorders of breast: Secondary | ICD-10-CM | POA: Diagnosis not present

## 2022-09-11 DIAGNOSIS — R928 Other abnormal and inconclusive findings on diagnostic imaging of breast: Secondary | ICD-10-CM

## 2022-09-12 ENCOUNTER — Ambulatory Visit: Payer: BC Managed Care – PPO | Admitting: Registered Nurse

## 2022-09-12 ENCOUNTER — Ambulatory Visit
Admission: RE | Admit: 2022-09-12 | Discharge: 2022-09-12 | Disposition: A | Discharge status: Home / Self Care | Payer: BC Managed Care – PPO | Attending: Gastroenterology | Admitting: Gastroenterology

## 2022-09-12 ENCOUNTER — Encounter
Admission: RE | Disposition: A | Discharge status: Home / Self Care | Payer: Self-pay | Source: Home / Self Care | Attending: Gastroenterology

## 2022-09-12 ENCOUNTER — Encounter: Payer: Self-pay | Admitting: Gastroenterology

## 2022-09-12 DIAGNOSIS — Z87891 Personal history of nicotine dependence: Secondary | ICD-10-CM | POA: Insufficient documentation

## 2022-09-12 DIAGNOSIS — D649 Anemia, unspecified: Secondary | ICD-10-CM | POA: Diagnosis not present

## 2022-09-12 DIAGNOSIS — Z1211 Encounter for screening for malignant neoplasm of colon: Secondary | ICD-10-CM | POA: Diagnosis not present

## 2022-09-12 DIAGNOSIS — E119 Type 2 diabetes mellitus without complications: Secondary | ICD-10-CM | POA: Insufficient documentation

## 2022-09-12 DIAGNOSIS — J45909 Unspecified asthma, uncomplicated: Secondary | ICD-10-CM | POA: Insufficient documentation

## 2022-09-12 DIAGNOSIS — D759 Disease of blood and blood-forming organs, unspecified: Secondary | ICD-10-CM | POA: Diagnosis not present

## 2022-09-12 DIAGNOSIS — Z7984 Long term (current) use of oral hypoglycemic drugs: Secondary | ICD-10-CM | POA: Diagnosis not present

## 2022-09-12 HISTORY — PX: COLONOSCOPY WITH PROPOFOL: SHX5780

## 2022-09-12 LAB — GLUCOSE, CAPILLARY: Glucose-Capillary: 119 mg/dL — ABNORMAL HIGH (ref 70–99)

## 2022-09-12 SURGERY — COLONOSCOPY WITH PROPOFOL
Anesthesia: General

## 2022-09-12 MED ORDER — SODIUM CHLORIDE 0.9 % IV SOLN: INTRAVENOUS | Status: DC

## 2022-09-12 MED ORDER — LIDOCAINE HCL (CARDIAC) PF 100 MG/5ML IV SOSY
PREFILLED_SYRINGE | INTRAVENOUS | Status: DC | PRN
  Administered 2022-09-12: 60 mg via INTRAVENOUS

## 2022-09-12 MED ORDER — PROPOFOL 500 MG/50ML IV EMUL
INTRAVENOUS | Status: DC | PRN
  Administered 2022-09-12: 175 ug/kg/min via INTRAVENOUS

## 2022-09-12 MED ORDER — DEXMEDETOMIDINE HCL IN NACL 80 MCG/20ML IV SOLN
INTRAVENOUS | Status: DC | PRN
  Administered 2022-09-12: 8 ug via INTRAVENOUS

## 2022-09-12 MED ORDER — PROPOFOL 10 MG/ML IV BOLUS
INTRAVENOUS | Status: DC | PRN
  Administered 2022-09-12: 80 mg via INTRAVENOUS

## 2022-09-12 NOTE — Anesthesia Preprocedure Evaluation (Signed)
Anesthesia Evaluation  Patient identified by MRN, date of birth, ID band Patient awake    Reviewed: Allergy & Precautions, NPO status , Patient's Chart, lab work & pertinent test results  Airway Mallampati: II  TM Distance: >3 FB Neck ROM: full    Dental  (+) Teeth Intact   Pulmonary neg pulmonary ROS, asthma , former smoker   Pulmonary exam normal breath sounds clear to auscultation       Cardiovascular Exercise Tolerance: Good negative cardio ROS Normal cardiovascular exam Rhythm:Regular     Neuro/Psych  Headaches negative neurological ROS  negative psych ROS   GI/Hepatic negative GI ROS, Neg liver ROS,,,  Endo/Other  negative endocrine ROSdiabetes, Type 2, Oral Hypoglycemic Agents    Renal/GU negative Renal ROS  negative genitourinary   Musculoskeletal   Abdominal  (+) + obese  Peds negative pediatric ROS (+)  Hematology negative hematology ROS (+) Blood dyscrasia, anemia   Anesthesia Other Findings Past Medical History: No date: Asthma No date: Diabetes mellitus without complication ([place]) 123456: Fatty liver No date: Hyperlipidemia No date: Migraines 03/30/2021: Otitis media  Past Surgical History: 1991: HERNIA REPAIR  BMI    Body Mass Index: 35.90 kg/m      Reproductive/Obstetrics negative OB ROS                             Anesthesia Physical Anesthesia Plan  ASA: 2  Anesthesia Plan: General   Post-op Pain Management:    Induction: Intravenous  PONV Risk Score and Plan: Propofol infusion and TIVA  Airway Management Planned: Natural Airway and Nasal Cannula  Additional Equipment:   Intra-op Plan:   Post-operative Plan:   Informed Consent: I have reviewed the patients History and Physical, chart, labs and discussed the procedure including the risks, benefits and alternatives for the proposed anesthesia with the patient or authorized representative who has  indicated his/her understanding and acceptance.     Dental Advisory Given  Plan Discussed with: CRNA and Surgeon  Anesthesia Plan Comments:        Anesthesia Quick Evaluation

## 2022-09-12 NOTE — Op Note (Signed)
[facility] Patient Name: Rachel Riggs Procedure Date: 09/12/2022 9:32 AM MRN: [id number] Account #: [id number] Date of Birth: 1961-06-01 Admit Type: Outpatient Age: 62 Room: [facility] ENDO ROOM 2 Gender: Female Note Status: Finalized Instrument Name: Jasper Riling [id number] Procedure:             Colonoscopy Indications:           Screening for colorectal malignant neoplasm, This is                         the patient's first colonoscopy Providers:             Lin Landsman MD, MD Referring MD:          Deborra Medina, MD (Referring MD) Medicines:             General Anesthesia Complications:         No immediate complications. Estimated blood loss: None. Procedure:             Pre-Anesthesia Assessment:                        - Prior to the procedure, a History and Physical was                         performed, and patient medications and allergies were                         reviewed. The patient is competent. The risks and                         benefits of the procedure and the sedation options and                         risks were discussed with the patient. All questions                         were answered and informed consent was obtained.                         Patient identification and proposed procedure were                         verified by the physician, the nurse, the                         anesthesiologist, the anesthetist and the technician                         in the pre-procedure area in the procedure room in the                         endoscopy suite. Mental Status Examination: alert and                         oriented. Airway Examination: normal oropharyngeal                         airway and neck mobility. Respiratory Examination:  clear to auscultation. CV Examination: normal.                         Prophylactic Antibiotics: The patient does not require                         prophylactic  antibiotics. Prior Anticoagulants: The                         patient has taken no anticoagulant or antiplatelet                         agents. ASA Grade Assessment: II - A patient with mild                         systemic disease. After reviewing the risks and                         benefits, the patient was deemed in satisfactory                         condition to undergo the procedure. The anesthesia                         plan was to use general anesthesia. Immediately prior                         to administration of medications, the patient was                         re-assessed for adequacy to receive sedatives. The                         heart rate, respiratory rate, oxygen saturations,                         blood pressure, adequacy of pulmonary ventilation, and                         response to care were monitored throughout the                         procedure. The physical status of the patient was                         re-assessed after the procedure.                        After obtaining informed consent, the colonoscope was                         passed under direct vision. Throughout the procedure,                         the patient's blood pressure, pulse, and oxygen                         saturations were monitored continuously. The  Colonoscope was introduced through the anus and                         advanced to the the cecum, identified by appendiceal                         orifice and ileocecal valve. The colonoscopy was                         performed without difficulty. The patient tolerated                         the procedure well. The quality of the bowel                         preparation was evaluated using the BBPS [facility] Bowel                         Preparation Scale) with scores of: Right Colon = 3,                         Transverse Colon = 3 and Left Colon = 3 (entire mucosa                         seen  well with no residual staining, small fragments                         of stool or opaque liquid). The total BBPS score                         equals 9. The ileocecal valve, appendiceal orifice,                         and rectum were photographed. Findings:      The perianal and digital rectal examinations were normal. Pertinent       negatives include normal sphincter tone and no palpable rectal lesions.      The entire examined colon appeared normal.      The retroflexed view of the distal rectum and anal verge was normal and       showed no anal or rectal abnormalities. Impression:            - The entire examined colon is normal.                        - The distal rectum and anal verge are normal on                         retroflexion view.                        - No specimens collected. Recommendation:        - Discharge patient to home (with escort).                        - Resume previous diet today.                        -  Continue present medications.                        - Repeat colonoscopy in 10 years for screening                         purposes. Procedure Code(s):     --- Professional ---                        [id number], Colorectal cancer screening; colonoscopy on                         individual not meeting criteria for high risk Diagnosis Code(s):     --- Professional ---                        Z12.11, Encounter for screening for malignant neoplasm                         of colon CPT copyright 2022 American Medical Association. All rights reserved. The codes documented in this report are preliminary and upon coder review may  be revised to meet current compliance requirements. Dr. Ulyess Mort Lin Landsman MD, MD 09/12/2022 10:14:14 AM This report has been signed electronically. Number of Addenda: 0 Note Initiated On: 09/12/2022 9:32 AM Scope Withdrawal Time: 0 hours 9 minutes 36 seconds  Total Procedure Duration: 0 hours 14 minutes 59 seconds   Estimated Blood Loss:  Estimated blood loss: none.      [facility]

## 2022-09-12 NOTE — H&P (Addendum)
Cephas Darby, MD [address]  [place]  [place], [place] [address]  Main: [phone]  Fax: [phone] Pager: [phone]  Primary Care Physician:  Crecencio Mc, MD Primary Gastroenterologist:  Dr. Cephas Darby  Pre-Procedure History & Physical: HPI:  Rachel Riggs is a 62 y.o. female is here for an colonoscopy.   Past Medical History:  Diagnosis Date   Asthma    Diabetes mellitus without complication ([place])    Fatty liver 2022   Hyperlipidemia    Migraines    Otitis media 03/30/2021    Past Surgical History:  Procedure Laterality Date   HERNIA REPAIR  1991    Prior to Admission medications   Medication Sig Start Date End Date Taking? Authorizing Provider  atorvastatin (LIPITOR) 20 MG tablet TAKE ONE TABLET BY MOUTH ONE TIME DAILY 08/11/22   Crecencio Mc, MD  butalbital-acetaminophen-caffeine (FIORICET) 50-325-40 MG tablet TAKE 1 TO 2 TABLETS BY MOUTH EVERY 6 HOURS AS NEEDED FOR HEADACHE 05/16/19   Crecencio Mc, MD  EPINEPHrine 0.3 mg/0.3 mL IJ SOAJ injection Inject 0.3 mg into the muscle as needed for anaphylaxis. 07/09/22   Crecencio Mc, MD  etodolac (LODINE) 500 MG tablet Take 1 tablet (500 mg total) by mouth 2 (two) times daily. 11/11/20   Crecencio Mc, MD  famotidine (PEPCID) 20 MG tablet Take 1 tablet (20 mg total) by mouth 2 (two) times daily. 03/28/21   Crecencio Mc, MD  levocetirizine (XYZAL) 5 MG tablet Take 5 mg by mouth every evening. 12/02/17   [provider]  meclizine (ANTIVERT) 25 MG tablet Take 1 tablet (25 mg total) by mouth 3 (three) times daily as needed. 09/04/21   Crecencio Mc, MD  metFORMIN (GLUCOPHAGE) 500 MG tablet TAKE ONE TABLET BY MOUTH TWICE A DAY WITH A MEAL 05/12/22   Crecencio Mc, MD  montelukast (SINGULAIR) 10 MG tablet TAKE ONE TABLET BY MOUTH AT BEDTIME 03/13/22   Crecencio Mc, MD  tirzepatide [facility]) 12.5 MG/0.5ML Pen INJECT THE CONTENTS OF ONE PEN UNDER THE SKIN WEEKLY ON THE SAME DAY EACH WEEK  07/09/22   Crecencio Mc, MD  triamcinolone cream (KENALOG) 0.1 % Apply 1 application topically 2 (two) times daily. To rash until resolved. 12/28/20   Crecencio Mc, MD  VENTOLIN HFA 108 (90 Base) MCG/ACT inhaler INHALE 2 PUFFS BY MOUTH EVERY 6 HOURS AS NEEDED FOR WHEEZING 11/09/20   Crecencio Mc, MD    Allergies as of 07/14/2022 - Review Complete 07/09/2022  Allergen Reaction Noted   Amoxicillin Anaphylaxis 09/07/2012   Aspirin Anaphylaxis 09/07/2012   Ibuprofen Anaphylaxis 09/07/2012   Penicillins Anaphylaxis 09/07/2012   Sulfa antibiotics Anaphylaxis 09/07/2012    Family History  Problem Relation Age of Onset   Parkinsonism Mother 31   Cancer Mother        uterus   Heart failure Mother    Cancer Father 11       NHL   Hypertension Father    Hyperlipidemia Father    Diabetes Father    Heart disease Father 22       CAD s/p 4 vessel CABG   Cancer Brother 46       prostate CA   Diabetes Maternal Grandmother    Stroke Maternal Grandmother    Heart disease Paternal Grandfather    COPD Paternal Grandfather    Breast cancer Neg Hx     Social History   Socioeconomic History  Marital status: Widowed    Spouse name: Not on file   Number of children: Not on file   Years of education: Not on file   Highest education level: Not on file  Occupational History   Not on file  Tobacco Use   Smoking status: Former    Types: Cigarettes    Quit date: 08/04/1984    Years since quitting: 38.1   Smokeless tobacco: Never  Vaping Use   Vaping Use: Never used  Substance and Sexual Activity   Alcohol use: Yes    Alcohol/week: 1.0 standard drink of alcohol    Types: 1 Glasses of wine per week   Drug use: No   Sexual activity: Not on file  Other Topics Concern   Not on file  Social History Narrative   Not on file   Social Determinants of Health   Financial Resource Strain: Low Risk  (05/27/2021)   Overall Financial Resource Strain (CARDIA)    Difficulty of Paying Living  Expenses: Not hard at all  Food Insecurity: Not on file  Transportation Needs: Not on file  Physical Activity: Not on file  Stress: Not on file  Social Connections: Not on file  Intimate Partner Violence: Not on file    Review of Systems: See HPI, otherwise negative ROS  Physical Exam: BP 91/60   Pulse 82   Temp (!) 97.5 F (36.4 C) (Temporal)   Resp 12   Wt 104 kg   LMP 01/03/2018   SpO2 100%   BMI 35.90 kg/m  General:   Alert,  pleasant and cooperative in NAD Head:  Normocephalic and atraumatic. Neck:  Supple; no masses or thyromegaly. Lungs:  Clear throughout to auscultation.    Heart:  Regular rate and rhythm. Abdomen:  Soft, nontender and nondistended. Normal bowel sounds, without guarding, and without rebound.   Neurologic:  Alert and  oriented x4;  grossly normal neurologically.  Impression/Plan: Rachel Riggs is here for an colonoscopy to be performed for colon cancer screening  Risks, benefits, limitations, and alternatives regarding  colonoscopy have been reviewed with the patient.  Questions have been answered.  All parties agreeable.   Sherri Sear, MD  09/12/2022, 10:24 AM

## 2022-09-12 NOTE — Anesthesia Postprocedure Evaluation (Signed)
Anesthesia Post Note  Patient: Rachel Riggs  Procedure(s) Performed: COLONOSCOPY WITH PROPOFOL  Patient location during evaluation: PACU Anesthesia Type: General Level of consciousness: awake and awake and alert Pain management: satisfactory to patient Vital Signs Assessment: post-procedure vital signs reviewed and stable Respiratory status: spontaneous breathing and respiratory function stable Cardiovascular status: stable Anesthetic complications: no  No notable events documented.   Last Vitals:  Vitals:   09/12/22 1026 09/12/22 1036  BP: 99/66 99/68  Pulse: 77 80  Resp: 19 18  Temp:    SpO2: 100% 100%    Last Pain:  Vitals:   09/12/22 1036  TempSrc:   PainSc: 0-No pain                 VAN STAVEREN,Remy Voiles

## 2022-09-12 NOTE — Transfer of Care (Signed)
Immediate Anesthesia Transfer of Care Note  Patient: Rachel Riggs  Procedure(s) Performed: COLONOSCOPY WITH PROPOFOL  Patient Location: PACU  Anesthesia Type:General  Level of Consciousness: awake, alert , and oriented  Airway & Oxygen Therapy: Patient Spontanous Breathing  Post-op Assessment: Report given to RN and Post -op Vital signs reviewed and stable  Post vital signs: Reviewed and stable  Last Vitals:  Vitals Value Taken Time  BP    Temp    Pulse    Resp    SpO2      Last Pain:  Vitals:   09/12/22 0932  TempSrc: Temporal         Complications: No notable events documented.

## 2022-09-14 NOTE — Progress Notes (Signed)
Voicemail.  No Message Left. 

## 2022-09-15 ENCOUNTER — Encounter: Payer: Self-pay | Admitting: Gastroenterology

## 2022-09-16 ENCOUNTER — Other Ambulatory Visit: Payer: BC Managed Care – PPO

## 2022-10-08 ENCOUNTER — Ambulatory Visit: Payer: BC Managed Care – PPO | Admitting: Internal Medicine

## 2022-10-08 ENCOUNTER — Encounter: Payer: Self-pay | Admitting: Internal Medicine

## 2022-10-08 VITALS — BP 108/68 | HR 80 | Temp 98.2°F | Ht 67.0 in | Wt 231.0 lb

## 2022-10-08 DIAGNOSIS — E782 Mixed hyperlipidemia: Secondary | ICD-10-CM | POA: Diagnosis not present

## 2022-10-08 DIAGNOSIS — E669 Obesity, unspecified: Secondary | ICD-10-CM

## 2022-10-08 DIAGNOSIS — E1169 Type 2 diabetes mellitus with other specified complication: Secondary | ICD-10-CM

## 2022-10-08 DIAGNOSIS — K4090 Unilateral inguinal hernia, without obstruction or gangrene, not specified as recurrent: Secondary | ICD-10-CM

## 2022-10-08 DIAGNOSIS — L509 Urticaria, unspecified: Secondary | ICD-10-CM

## 2022-10-08 MED ORDER — METFORMIN HCL 500 MG PO TABS: ORAL_TABLET | ORAL | 0 refills | Status: DC

## 2022-10-08 MED ORDER — FAMOTIDINE 20 MG PO TABS: 20.0000 mg | ORAL_TABLET | Freq: Two times a day (BID) | ORAL | 11 refills | Status: DC

## 2022-10-08 MED ORDER — TIRZEPATIDE 15 MG/0.5ML ~~LOC~~ SOAJ: 15.0000 mg | SUBCUTANEOUS | 2 refills | Status: DC

## 2022-10-08 MED ORDER — MOUNJARO 12.5 MG/0.5ML ~~LOC~~ SOAJ: SUBCUTANEOUS | 2 refills | Status: DC

## 2022-10-08 NOTE — Assessment & Plan Note (Addendum)
Improving control with low GI diet  .  foot exam was done today.  There is  no proteinuria on prior micro urinalysis.  She is asking to  increase the dose of Mounjaro to  15 mg weekly .  Continue atorvastatin for primary prevention     Lab Results  Component Value Date   HGBA1C 6.0 07/09/2022   Lab Results  Component Value Date   MICROALBUR 1.4 07/09/2022   MICROALBUR <0.7 11/09/2020

## 2022-10-08 NOTE — Progress Notes (Signed)
Subjective:  Patient ID: Rachel Riggs, female    DOB: Aug 16, 1960  Age: 62 y.o. MRN: [id number]  CC: The primary encounter diagnosis was Mixed hyperlipidemia. Diagnoses of Type 2 diabetes mellitus with obesity ([place]), Inguinal hernia of right side without obstruction or gangrene, and Hives were also pertinent to this visit.   HPI Rachel Riggs presents for  Chief Complaint  Patient presents with   Medical Management of Chronic Issues    3 month follow up on diabetes, discuss increasing Mounjaro. The montelukast, famotidine, and levocetirizine does help with the hives. Pt would like to discuss coming off of the Atorvastatin.    1) HIves controlled with  singulair , xyzal and famotidine regimen  2) Grief : lost husband Joe to cancer several months ago.   3) obesity with type 2 DM:  she is tolerating mounjaro 12.5 mg but has not lost any weight bc she is "eating the wrong stuff".  Since Joe died she has lost interest in cooking for one.  Not exercising.   4) Right inguinal bulge,  tender,  first appreciated a few days ago.   Outpatient Medications Prior to Visit  Medication Sig Dispense Refill   atorvastatin (LIPITOR) 20 MG tablet TAKE ONE TABLET BY MOUTH ONE TIME DAILY 90 tablet 3   butalbital-acetaminophen-caffeine (FIORICET) 50-325-40 MG tablet TAKE 1 TO 2 TABLETS BY MOUTH EVERY 6 HOURS AS NEEDED FOR HEADACHE 60 tablet 2   EPINEPHrine 0.3 mg/0.3 mL IJ SOAJ injection Inject 0.3 mg into the muscle as needed for anaphylaxis. 1 each 1   levocetirizine (XYZAL) 5 MG tablet Take 5 mg by mouth every evening.     meclizine (ANTIVERT) 25 MG tablet Take 1 tablet (25 mg total) by mouth 3 (three) times daily as needed. 90 tablet 0   montelukast (SINGULAIR) 10 MG tablet TAKE ONE TABLET BY MOUTH AT BEDTIME 90 tablet 3   triamcinolone cream (KENALOG) 0.1 % Apply 1 application topically 2 (two) times daily. To rash until resolved. 85 g 0   VENTOLIN HFA 108 (90 Base) MCG/ACT inhaler INHALE 2 PUFFS  BY MOUTH EVERY 6 HOURS AS NEEDED FOR WHEEZING 18 g 0   etodolac (LODINE) 500 MG tablet Take 1 tablet (500 mg total) by mouth 2 (two) times daily. (Patient not taking: Reported on 09/12/2022) 60 tablet 0   famotidine (PEPCID) 20 MG tablet Take 1 tablet (20 mg total) by mouth 2 (two) times daily. 60 tablet 11   metFORMIN (GLUCOPHAGE) 500 MG tablet TAKE ONE TABLET BY MOUTH TWICE A DAY WITH A MEAL 180 tablet 0   tirzepatide (MOUNJARO) 12.5 MG/0.5ML Pen INJECT THE CONTENTS OF ONE PEN UNDER THE SKIN WEEKLY ON THE SAME DAY EACH WEEK 2 mL 2   No facility-administered medications prior to visit.    Review of Systems;  Patient denies headache, fevers, malaise, unintentional weight loss, skin rash, eye pain, sinus congestion and sinus pain, sore throat, dysphagia,  hemoptysis , cough, dyspnea, wheezing, chest pain, palpitations, orthopnea, edema, abdominal pain, nausea, melena, diarrhea, constipation, flank pain, dysuria, hematuria, urinary  Frequency, nocturia, numbness, tingling, seizures,  Focal weakness, Loss of consciousness,  Tremor, insomnia, depression, anxiety, and suicidal ideation.      Objective:  BP 108/68   Pulse 80   Temp 98.2 F (36.8 C) (Oral)   Ht '5\' 7"'$  (1.702 m)   Wt 231 lb (104.8 kg)   LMP 01/03/2018   SpO2 99%   BMI 36.18 kg/m   BP Readings from  Last 3 Encounters:  10/08/22 108/68  09/12/22 99/68  07/09/22 130/74    Wt Readings from Last 3 Encounters:  10/08/22 231 lb (104.8 kg)  09/12/22 229 lb 3.2 oz (104 kg)  07/09/22 227 lb (103 kg)    Physical Exam Vitals reviewed.  Constitutional:      General: She is not in acute distress.    Appearance: Normal appearance. She is normal weight. She is not ill-appearing, toxic-appearing or diaphoretic.  HENT:     Head: Normocephalic.  Eyes:     General: No scleral icterus.       Right eye: No discharge.        Left eye: No discharge.     Conjunctiva/sclera: Conjunctivae normal.  Cardiovascular:     Rate and Rhythm:  Normal rate and regular rhythm.     Heart sounds: Normal heart sounds.  Pulmonary:     Effort: Pulmonary effort is normal. No respiratory distress.     Breath sounds: Normal breath sounds.  Abdominal:     Hernia: A hernia is present.       Comments: Hernia   Musculoskeletal:        General: Normal range of motion.  Skin:    General: Skin is warm and dry.  Neurological:     General: No focal deficit present.     Mental Status: She is alert and oriented to person, place, and time. Mental status is at baseline.  Psychiatric:        Mood and Affect: Mood normal.        Behavior: Behavior normal.        Thought Content: Thought content normal.        Judgment: Judgment normal.     Lab Results  Component Value Date   HGBA1C 6.0 07/09/2022   HGBA1C 6.1 07/04/2021   HGBA1C 6.3 11/09/2020    Lab Results  Component Value Date   CREATININE 0.77 07/09/2022   CREATININE 0.78 07/04/2021   CREATININE 0.73 11/09/2020    Lab Results  Component Value Date   WBC 6.0 07/09/2022   HGB 13.0 07/09/2022   HCT 39.8 07/09/2022   PLT 290.0 07/09/2022   GLUCOSE 87 07/09/2022   CHOL 165 07/09/2022   TRIG 101.0 07/09/2022   HDL 77.40 07/09/2022   LDLDIRECT 77.0 07/09/2022   LDLCALC 67 07/09/2022   ALT 37 (H) 07/09/2022   AST 35 07/09/2022   NA 140 07/09/2022   K 4.5 07/09/2022   CL 102 07/09/2022   CREATININE 0.77 07/09/2022   BUN 12 07/09/2022   CO2 28 07/09/2022   TSH 2.81 07/09/2022   HGBA1C 6.0 07/09/2022   MICROALBUR 1.4 07/09/2022    No results found.  Assessment & Plan:  .Mixed hyperlipidemia -     Lipid panel; Future -     LDL cholesterol, direct; Future  Type 2 diabetes mellitus with obesity (HCC) Assessment & Plan: Improving control with low GI diet  .  foot exam was done today.  There is  no proteinuria on prior micro urinalysis.  She is asking to  increase the dose of Mounjaro to  15 mg weekly .  Continue atorvastatin for primary prevention     Lab Results   Component Value Date   HGBA1C 6.0 07/09/2022   Lab Results  Component Value Date   MICROALBUR 1.4 07/09/2022   MICROALBUR <0.7 11/09/2020        Orders: -     metFORMIN HCl; TAKE ONE TABLET BY  MOUTH TWICE A DAY WITH A MEAL  Dispense: 180 tablet; Refill: 0 -     Hemoglobin A1c; Future -     Comprehensive metabolic panel; Future  Inguinal hernia of right side without obstruction or gangrene Assessment & Plan: Small,  non reducible, referring to [facility]   Orders: -     Ambulatory referral to General Surgery  Hives Assessment & Plan: No recurrence since starting  a regimen of singulair , famotidine 20 mg bid and zyrtec for daily use as preventive    Other orders -     Famotidine; Take 1 tablet (20 mg total) by mouth 2 (two) times daily.  Dispense: 60 tablet; Refill: 11 -     Tirzepatide; Inject 15 mg into the skin once a week.  Dispense: 6 mL; Refill: 2     Follow-up: Return in about 3 months (around 01/08/2023) for follow up diabetes.   Crecencio Mc, MD

## 2022-10-08 NOTE — Assessment & Plan Note (Signed)
No recurrence since starting  a regimen of singulair , famotidine 20 mg bid and zyrtec for daily use as preventive

## 2022-10-08 NOTE — Assessment & Plan Note (Signed)
Small,  non reducible, referring to [employer] [facility]

## 2022-10-23 DIAGNOSIS — R1031 Right lower quadrant pain: Secondary | ICD-10-CM | POA: Diagnosis not present

## 2022-10-27 ENCOUNTER — Telehealth: Payer: BC Managed Care – PPO | Admitting: Physician Assistant

## 2022-10-27 DIAGNOSIS — J208 Acute bronchitis due to other specified organisms: Secondary | ICD-10-CM | POA: Diagnosis not present

## 2022-10-27 DIAGNOSIS — B9689 Other specified bacterial agents as the cause of diseases classified elsewhere: Secondary | ICD-10-CM

## 2022-10-27 MED ORDER — BENZONATATE 100 MG PO CAPS: 100.0000 mg | ORAL_CAPSULE | Freq: Three times a day (TID) | ORAL | 0 refills | Status: DC | PRN

## 2022-10-27 MED ORDER — AZITHROMYCIN 250 MG PO TABS: ORAL_TABLET | ORAL | 0 refills | Status: AC

## 2022-10-27 MED ORDER — PREDNISONE 20 MG PO TABS: 40.0000 mg | ORAL_TABLET | Freq: Every day | ORAL | 0 refills | Status: DC

## 2022-10-27 NOTE — Patient Instructions (Signed)
Rachel Riggs, thank you for joining [name], PA-C for today's virtual visit.  While this provider is not your primary care provider (PCP), if your PCP is located in our provider database this encounter information will be shared with them immediately following your visit.   [facility] account gives you access to today's visit and all your visits, tests, and labs performed at [facility] " click here if you don't have a [facility] account or go to [email or web address]  Consent: (Patient) Rachel Riggs provided verbal consent for this virtual visit at the beginning of the encounter.  Current Medications:  Current Outpatient Medications:    azithromycin (ZITHROMAX) 250 MG tablet, Take 2 tablets on day 1, then 1 tablet daily on days 2 through 5, Disp: 6 tablet, Rfl: 0   benzonatate (TESSALON) 100 MG capsule, Take 1 capsule (100 mg total) by mouth 3 (three) times daily as needed., Disp: 30 capsule, Rfl: 0   predniSONE (DELTASONE) 20 MG tablet, Take 2 tablets (40 mg total) by mouth daily with breakfast., Disp: 10 tablet, Rfl: 0   atorvastatin (LIPITOR) 20 MG tablet, TAKE ONE TABLET BY MOUTH ONE TIME DAILY, Disp: 90 tablet, Rfl: 3   butalbital-acetaminophen-caffeine (FIORICET) 50-325-40 MG tablet, TAKE 1 TO 2 TABLETS BY MOUTH EVERY 6 HOURS AS NEEDED FOR HEADACHE, Disp: 60 tablet, Rfl: 2   EPINEPHrine 0.3 mg/0.3 mL IJ SOAJ injection, Inject 0.3 mg into the muscle as needed for anaphylaxis., Disp: 1 each, Rfl: 1   famotidine (PEPCID) 20 MG tablet, Take 1 tablet (20 mg total) by mouth 2 (two) times daily., Disp: 60 tablet, Rfl: 11   levocetirizine (XYZAL) 5 MG tablet, Take 5 mg by mouth every evening., Disp: , Rfl:    meclizine (ANTIVERT) 25 MG tablet, Take 1 tablet (25 mg total) by mouth 3 (three) times daily as needed., Disp: 90 tablet, Rfl: 0   metFORMIN (GLUCOPHAGE) 500 MG tablet, TAKE ONE TABLET BY MOUTH TWICE A DAY WITH A MEAL, Disp: 180  tablet, Rfl: 0   montelukast (SINGULAIR) 10 MG tablet, TAKE ONE TABLET BY MOUTH AT BEDTIME, Disp: 90 tablet, Rfl: 3   tirzepatide (MOUNJARO) 15 MG/0.5ML Pen, Inject 15 mg into the skin once a week., Disp: 6 mL, Rfl: 2   triamcinolone cream (KENALOG) 0.1 %, Apply 1 application topically 2 (two) times daily. To rash until resolved., Disp: 85 g, Rfl: 0   VENTOLIN HFA 108 (90 Base) MCG/ACT inhaler, INHALE 2 PUFFS BY MOUTH EVERY 6 HOURS AS NEEDED FOR WHEEZING, Disp: 18 g, Rfl: 0   Medications ordered in this encounter:  Meds ordered this encounter  Medications   benzonatate (TESSALON) 100 MG capsule    Sig: Take 1 capsule (100 mg total) by mouth 3 (three) times daily as needed.    Dispense:  30 capsule    Refill:  0    Order Specific Question:   Supervising Provider    Answer:   Chase Picket [id number]   azithromycin (ZITHROMAX) 250 MG tablet    Sig: Take 2 tablets on day 1, then 1 tablet daily on days 2 through 5    Dispense:  6 tablet    Refill:  0    Order Specific Question:   Supervising Provider    Answer:   Chase Picket [id number]   predniSONE (DELTASONE) 20 MG tablet    Sig: Take 2 tablets (40 mg total) by mouth daily with breakfast.    Dispense:  10 tablet    Refill:  0    Order Specific Question:   Supervising Provider    Answer:   Chase Picket [id number]     *If you need refills on other medications prior to your next appointment, please contact your pharmacy*  Follow-Up: Call back or seek an in-person evaluation if the symptoms worsen or if the condition fails to improve as anticipated.  [facility] [phone]  Other Instructions  Acute Bronchitis, Adult  Acute bronchitis is sudden inflammation of the main airways (bronchi) that come off the windpipe (trachea) in the lungs. The swelling causes the airways to get smaller and make more mucus than normal. This can make it hard to breathe and can cause coughing or noisy breathing  (wheezing). Acute bronchitis may last several weeks. The cough may last longer. Allergies, asthma, and exposure to smoke may make the condition worse. What are the causes? This condition can be caused by germs and by substances that irritate the lungs, including: Cold and flu viruses. The most common cause of this condition is the virus that causes the common cold. Bacteria. This is less common. Breathing in substances that irritate the lungs, including: Smoke from cigarettes and other forms of tobacco. Dust and pollen. Fumes from household cleaning products, gases, or burned fuel. Indoor or outdoor air pollution. What increases the risk? The following factors may make you more likely to develop this condition: A weak body's defense system, also called the immune system. A condition that affects your lungs and breathing, such as asthma. What are the signs or symptoms? Common symptoms of this condition include: Coughing. This may bring up clear, yellow, or green mucus from your lungs (sputum). Wheezing. Runny or stuffy nose. Having too much mucus in your lungs (chest congestion). Shortness of breath. Aches and pains, including sore throat or chest. How is this diagnosed? This condition is usually diagnosed based on: Your symptoms and medical history. A physical exam. You may also have other tests, including tests to rule out other conditions, such as pneumonia. These tests include: A test of lung function. Test of a mucus sample to look for the presence of bacteria. Tests to check the oxygen level in your blood. Blood tests. Chest X-ray. How is this treated? Most cases of acute bronchitis clear up over time without treatment. Your health care provider may recommend: Drinking more fluids to help thin your mucus so it is easier to cough up. Taking inhaled medicine (inhaler) to improve air flow in and out of your lungs. Using a vaporizer or a humidifier. These are machines that add  water to the air to help you breathe better. Taking a medicine that thins mucus and clears congestion (expectorant). Taking a medicine that prevents or stops coughing (cough suppressant). It is not common to take an antibiotic medicine for this condition. Follow these instructions at home:  Take over-the-counter and prescription medicines only as told by your health care provider. Use an inhaler, vaporizer, or humidifier as told by your health care provider. Take two teaspoons (10 mL) of honey at bedtime to lessen coughing at night. Drink enough fluid to keep your urine pale yellow. Do not use any products that contain nicotine or tobacco. These products include cigarettes, chewing tobacco, and vaping devices, such as e-cigarettes. If you need help quitting, ask your health care provider. Get plenty of rest. Return to your normal activities as told by your health care provider. Ask your health care provider what  activities are safe for you. Keep all follow-up visits. This is important. How is this prevented? To lower your risk of getting this condition again: Wash your hands often with soap and water for at least 20 seconds. If soap and water are not available, use hand sanitizer. Avoid contact with people who have cold symptoms. Try not to touch your mouth, nose, or eyes with your hands. Avoid breathing in smoke or chemical fumes. Breathing smoke or chemical fumes will make your condition worse. Get the flu shot every year. Contact a health care provider if: Your symptoms do not improve after 2 weeks. You have trouble coughing up the mucus. Your cough keeps you awake at night. You have a fever. Get help right away if you: Cough up blood. Feel pain in your chest. Have severe shortness of breath. Faint or keep feeling like you are going to faint. Have a severe headache. Have a fever or chills that get worse. These symptoms may represent a serious problem that is an emergency. Do not  wait to see if the symptoms will go away. Get medical help right away. Call your local emergency services (911 in the [place] Do not drive yourself to the hospital. Summary Acute bronchitis is inflammation of the main airways (bronchi) that come off the windpipe (trachea) in the lungs. The swelling causes the airways to get smaller and make more mucus than normal. Drinking more fluids can help thin your mucus so it is easier to cough up. Take over-the-counter and prescription medicines only as told by your health care provider. Do not use any products that contain nicotine or tobacco. These products include cigarettes, chewing tobacco, and vaping devices, such as e-cigarettes. If you need help quitting, ask your health care provider. Contact a health care provider if your symptoms do not improve after 2 weeks. This information is not intended to replace advice given to you by your health care provider. Make sure you discuss any questions you have with your health care provider. Document Revised: 10/31/2021 Document Reviewed: 11/21/2020 Elsevier Patient Education  [place].    If you have been instructed to have an in-person evaluation today at a local Urgent Care facility, please use the link below. It will take you to a list of all of our available [facility], including address, phone number and hours of operation. Please do not delay care.  [facility]  If you or a family member do not have a primary care provider, use the link below to schedule a visit and establish care. When you choose a [facility] primary care physician or advanced practice provider, you gain a long-term partner in health. Find a Primary Care Provider  Learn more about [facility]'s in-office and virtual care options: [facility]

## 2022-10-27 NOTE — Progress Notes (Signed)
Virtual Visit Consent   Rachel Riggs, you are scheduled for a virtual visit with a [facility] provider today. Just as with appointments in the office, your consent must be obtained to participate. Your consent will be active for this visit and any virtual visit you may have with one of our providers in the next 365 days. If you have a MyChart account, a copy of this consent can be sent to you electronically.  As this is a virtual visit, video technology does not allow for your provider to perform a traditional examination. This may limit your provider's ability to fully assess your condition. If your provider identifies any concerns that need to be evaluated in person or the need to arrange testing (such as labs, EKG, etc.), we will make arrangements to do so. Although advances in technology are sophisticated, we cannot ensure that it will always work on either your end or our end. If the connection with a video visit is poor, the visit may have to be switched to a telephone visit. With either a video or telephone visit, we are not always able to ensure that we have a secure connection.  By engaging in this virtual visit, you consent to the provision of healthcare and authorize for your insurance to be billed (if applicable) for the services provided during this visit. Depending on your insurance coverage, you may receive a charge related to this service.  I need to obtain your verbal consent now. Are you willing to proceed with your visit today? Rachel Riggs has provided verbal consent on 10/27/2022 for a virtual visit (video or telephone). [name], PA-C  Date: 10/27/2022 10:50 AM  Virtual Visit via Video Note   I, [name], connected with  Rachel Riggs  ([id number], [birth year]) on 10/27/22 at 10:45 AM EDT by a video-enabled telemedicine application and verified that I am speaking with the correct person using two identifiers.  Location: Patient: Virtual Visit  Location Patient: Home Provider: Virtual Visit Location Provider: Home Office   I discussed the limitations of evaluation and management by telemedicine and the availability of in person appointments. The patient expressed understanding and agreed to proceed.    History of Present Illness: Rachel Riggs is a 62 y.o. who identifies as a female who was assigned female at birth, and is being seen today for cough.  HPI: Cough This is a new problem. The current episode started 1 to 4 weeks ago. The problem has been gradually worsening. The problem occurs constantly. The cough is Productive of sputum and productive of purulent sputum. Associated symptoms include headaches, myalgias, nasal congestion, postnasal drip, rhinorrhea, shortness of breath (mild) and wheezing. Pertinent negatives include no chills, ear congestion, ear pain, fever or sore throat. The symptoms are aggravated by lying down and pollens. Treatments tried: Mucinex DM, Nyquil. The treatment provided no relief. Her past medical history is significant for asthma and bronchitis.      Problems:  Patient Active Problem List   Diagnosis Date Noted   Inguinal hernia of right side without obstruction or gangrene 10/08/2022   Screening for colon cancer 09/12/2022   Widowed 07/10/2022   Post-menopausal bleeding 02/22/2019   Aspirin contraindicated 08/05/2016   Generalized anxiety disorder 06/26/2015   Anemia, iron deficiency 09/14/2014   Hives 09/11/2014   Hepatic steatosis 12/22/2013   Leukocytosis, unspecified 12/22/2013   Type 2 diabetes mellitus with obesity ([place]) 12/21/2013   Encounter for preventive health examination 10/13/2012   Screening  for cervical cancer 10/06/2012   Allergic rhinitis 09/08/2012   History of asthma 09/07/2012   Hyperlipidemia 09/07/2012   Obesity 09/07/2012   Family history of cervical cancer 09/07/2012    Allergies:  Allergies  Allergen Reactions   Amoxicillin Anaphylaxis   Aspirin Anaphylaxis    Ibuprofen Anaphylaxis   Penicillins Anaphylaxis   Sulfa Antibiotics Anaphylaxis   Medications:  Current Outpatient Medications:    azithromycin (ZITHROMAX) 250 MG tablet, Take 2 tablets on day 1, then 1 tablet daily on days 2 through 5, Disp: 6 tablet, Rfl: 0   benzonatate (TESSALON) 100 MG capsule, Take 1 capsule (100 mg total) by mouth 3 (three) times daily as needed., Disp: 30 capsule, Rfl: 0   predniSONE (DELTASONE) 20 MG tablet, Take 2 tablets (40 mg total) by mouth daily with breakfast., Disp: 10 tablet, Rfl: 0   atorvastatin (LIPITOR) 20 MG tablet, TAKE ONE TABLET BY MOUTH ONE TIME DAILY, Disp: 90 tablet, Rfl: 3   butalbital-acetaminophen-caffeine (FIORICET) 50-325-40 MG tablet, TAKE 1 TO 2 TABLETS BY MOUTH EVERY 6 HOURS AS NEEDED FOR HEADACHE, Disp: 60 tablet, Rfl: 2   EPINEPHrine 0.3 mg/0.3 mL IJ SOAJ injection, Inject 0.3 mg into the muscle as needed for anaphylaxis., Disp: 1 each, Rfl: 1   famotidine (PEPCID) 20 MG tablet, Take 1 tablet (20 mg total) by mouth 2 (two) times daily., Disp: 60 tablet, Rfl: 11   levocetirizine (XYZAL) 5 MG tablet, Take 5 mg by mouth every evening., Disp: , Rfl:    meclizine (ANTIVERT) 25 MG tablet, Take 1 tablet (25 mg total) by mouth 3 (three) times daily as needed., Disp: 90 tablet, Rfl: 0   metFORMIN (GLUCOPHAGE) 500 MG tablet, TAKE ONE TABLET BY MOUTH TWICE A DAY WITH A MEAL, Disp: 180 tablet, Rfl: 0   montelukast (SINGULAIR) 10 MG tablet, TAKE ONE TABLET BY MOUTH AT BEDTIME, Disp: 90 tablet, Rfl: 3   tirzepatide (MOUNJARO) 15 MG/0.5ML Pen, Inject 15 mg into the skin once a week., Disp: 6 mL, Rfl: 2   triamcinolone cream (KENALOG) 0.1 %, Apply 1 application topically 2 (two) times daily. To rash until resolved., Disp: 85 g, Rfl: 0   VENTOLIN HFA 108 (90 Base) MCG/ACT inhaler, INHALE 2 PUFFS BY MOUTH EVERY 6 HOURS AS NEEDED FOR WHEEZING, Disp: 18 g, Rfl: 0  Observations/Objective: Patient is well-developed, well-nourished in no acute distress.   Resting comfortably at home.  Head is normocephalic, atraumatic.  No labored breathing.  Speech is clear and coherent with logical content.  Patient is alert and oriented at baseline.    Assessment and Plan: 1. Acute bacterial bronchitis - benzonatate (TESSALON) 100 MG capsule; Take 1 capsule (100 mg total) by mouth 3 (three) times daily as needed.  Dispense: 30 capsule; Refill: 0 - azithromycin (ZITHROMAX) 250 MG tablet; Take 2 tablets on day 1, then 1 tablet daily on days 2 through 5  Dispense: 6 tablet; Refill: 0 - predniSONE (DELTASONE) 20 MG tablet; Take 2 tablets (40 mg total) by mouth daily with breakfast.  Dispense: 10 tablet; Refill: 0  - Worsening over a week despite OTC medications - Will treat with Z-pack, Prednisone, and tessalon perles - Can continue Mucinex  - Use rescue inhaler as needed - Continue allergy meds - Push fluids.  - Rest.  - Steam and humidifier can help - Seek in person evaluation if worsening or symptoms fail to improve    Follow Up Instructions: I discussed the assessment and treatment plan with the patient. The  patient was provided an opportunity to ask questions and all were answered. The patient agreed with the plan and demonstrated an understanding of the instructions.  A copy of instructions were sent to the patient via MyChart unless otherwise noted below.    The patient was advised to call back or seek an in-person evaluation if the symptoms worsen or if the condition fails to improve as anticipated.  Time:  I spent 8 minutes with the patient via telehealth technology discussing the above problems/concerns.    [name], PA-C

## 2022-10-29 ENCOUNTER — Other Ambulatory Visit: Payer: Self-pay | Admitting: General Surgery

## 2022-10-29 DIAGNOSIS — R1031 Right lower quadrant pain: Secondary | ICD-10-CM

## 2022-11-04 ENCOUNTER — Ambulatory Visit
Admission: RE | Admit: 2022-11-04 | Discharge: 2022-11-04 | Disposition: A | Discharge status: Home / Self Care | Payer: BC Managed Care – PPO | Source: Ambulatory Visit | Attending: General Surgery | Admitting: General Surgery

## 2022-11-04 DIAGNOSIS — R1031 Right lower quadrant pain: Secondary | ICD-10-CM | POA: Diagnosis not present

## 2022-11-04 DIAGNOSIS — R2241 Localized swelling, mass and lump, right lower limb: Secondary | ICD-10-CM | POA: Diagnosis not present

## 2022-11-17 ENCOUNTER — Other Ambulatory Visit: Payer: Self-pay | Admitting: General Surgery

## 2022-11-17 DIAGNOSIS — R1031 Right lower quadrant pain: Secondary | ICD-10-CM

## 2022-11-29 ENCOUNTER — Encounter: Payer: Self-pay | Admitting: Internal Medicine

## 2022-12-01 NOTE — Telephone Encounter (Signed)
Not in patients current medication list.  

## 2022-12-02 MED ORDER — ETODOLAC 500 MG PO TABS: 500.0000 mg | ORAL_TABLET | Freq: Two times a day (BID) | ORAL | 0 refills | Status: AC

## 2022-12-04 ENCOUNTER — Telehealth: Payer: Self-pay | Admitting: Internal Medicine

## 2022-12-05 ENCOUNTER — Ambulatory Visit: Payer: BC Managed Care – PPO

## 2022-12-05 MED ORDER — VENTOLIN HFA 108 (90 BASE) MCG/ACT IN AERS: 2.0000 | INHALATION_SPRAY | Freq: Four times a day (QID) | RESPIRATORY_TRACT | 0 refills | Status: DC | PRN

## 2022-12-05 NOTE — Telephone Encounter (Signed)
Pt called stating her new pharmacy is [facility] and not [facility]. Pt also want to see If the ventolin can go to [facility]

## 2022-12-05 NOTE — Addendum Note (Signed)
Addended by: Sandy Salaam on: 12/05/2022 04:00 PM   Modules accepted: Orders

## 2022-12-05 NOTE — Telephone Encounter (Signed)
Pharmacy updated and rx sent to [facility].

## 2022-12-08 ENCOUNTER — Other Ambulatory Visit: Payer: Self-pay | Admitting: Internal Medicine

## 2022-12-08 DIAGNOSIS — E669 Obesity, unspecified: Secondary | ICD-10-CM

## 2022-12-30 ENCOUNTER — Other Ambulatory Visit (INDEPENDENT_AMBULATORY_CARE_PROVIDER_SITE_OTHER): Payer: BC Managed Care – PPO

## 2022-12-30 DIAGNOSIS — E1169 Type 2 diabetes mellitus with other specified complication: Secondary | ICD-10-CM

## 2022-12-30 DIAGNOSIS — E669 Obesity, unspecified: Secondary | ICD-10-CM | POA: Diagnosis not present

## 2022-12-30 DIAGNOSIS — Z7984 Long term (current) use of oral hypoglycemic drugs: Secondary | ICD-10-CM

## 2022-12-30 DIAGNOSIS — E782 Mixed hyperlipidemia: Secondary | ICD-10-CM | POA: Diagnosis not present

## 2022-12-30 LAB — LIPID PANEL
Cholesterol: 146 mg/dL (ref 0–200)
HDL: 71.6 mg/dL (ref >39.00)
LDL Cholesterol: 58 mg/dL (ref 0–99)
NonHDL: 74.84
Total CHOL/HDL Ratio: 2
Triglycerides: 86 mg/dL (ref 0.0–149.0)
VLDL: 17.2 mg/dL (ref 0.0–40.0)

## 2022-12-30 LAB — COMPREHENSIVE METABOLIC PANEL
ALT: 46 U/L — ABNORMAL HIGH (ref 0–35)
AST: 35 U/L (ref 0–37)
Albumin: 4.3 g/dL (ref 3.5–5.2)
Alkaline Phosphatase: 87 U/L (ref 39–117)
BUN: 14 mg/dL (ref 6–23)
CO2: 28 mEq/L (ref 19–32)
Calcium: 9.9 mg/dL (ref 8.4–10.5)
Chloride: 99 mEq/L (ref 96–112)
Creatinine, Ser: 0.91 mg/dL (ref 0.40–1.20)
GFR: 67.71 mL/min (ref >60.00)
Glucose, Bld: 125 mg/dL — ABNORMAL HIGH (ref 70–99)
Potassium: 4.6 mEq/L (ref 3.5–5.1)
Sodium: 137 mEq/L (ref 135–145)
Total Bilirubin: 1.3 mg/dL — ABNORMAL HIGH (ref 0.2–1.2)
Total Protein: 7.1 g/dL (ref 6.0–8.3)

## 2022-12-30 LAB — HEMOGLOBIN A1C: Hgb A1c MFr Bld: 6.5 % (ref 4.6–6.5)

## 2022-12-30 LAB — LDL CHOLESTEROL, DIRECT: Direct LDL: 59 mg/dL

## 2022-12-31 ENCOUNTER — Encounter: Payer: Self-pay | Admitting: Internal Medicine

## 2023-01-07 NOTE — Telephone Encounter (Signed)
Printed and placed in results folder.  

## 2023-01-22 ENCOUNTER — Telehealth: Payer: Self-pay

## 2023-01-22 NOTE — Telephone Encounter (Signed)
Patient scheduled an appointment via MyChart.  I followed up with her because she scheduled an appointment to see Dr. Duncan Dull and in the appointment notes, it states she needs a lab visit.  Patient states she received a message from Dr. Darrick Huntsman, but was not clear as to whether she needed to schedule an appointment with Dr. Darrick Huntsman, a lab visit, or both.  Please clarify, thanks.

## 2023-01-23 NOTE — Telephone Encounter (Signed)
No appt needed, if she needs labs they can be done at [facility]

## 2023-01-26 NOTE — Telephone Encounter (Signed)
I spoke with patient and she states she is fine having labs at the time of her appointment with Dr. Duncan Dull.

## 2023-03-04 ENCOUNTER — Other Ambulatory Visit (HOSPITAL_COMMUNITY): Payer: Self-pay

## 2023-03-08 ENCOUNTER — Other Ambulatory Visit: Payer: Self-pay | Admitting: Internal Medicine

## 2023-03-11 ENCOUNTER — Telehealth: Payer: Self-pay | Admitting: Internal Medicine

## 2023-03-11 NOTE — Telephone Encounter (Signed)
Prescription Request  03/11/2023  LOV: 10/08/2022  What is the name of the medication or equipment? Mounjaro 15mg . She has changed insurance and they are needing a Prior Auth.  Have you contacted your pharmacy to request a refill? Yes   Which pharmacy would you like this sent to?  [facility] [address] - [place], [place] - [address] [place] [address] Phone: [phone] Fax: [phone]    Patient notified that their request is being sent to the clinical staff for review and that they should receive a response within 2 business days.   Please advise at Mobile [phone] (mobile)

## 2023-03-11 NOTE — Telephone Encounter (Signed)
PA for Mounjaro is needed. 

## 2023-03-12 ENCOUNTER — Other Ambulatory Visit (HOSPITAL_COMMUNITY): Payer: Self-pay

## 2023-03-12 ENCOUNTER — Telehealth: Payer: Self-pay

## 2023-03-12 NOTE — Telephone Encounter (Signed)
Pharmacy Patient Advocate Encounter   Received notification from Pt Calls Messages that prior authorization for Mounjaro 15mg /dose is required/requested.   Insurance verification completed.   The patient is insured through  [employer]  .   Per test claim: PA required; PA submitted to [employer] via CoverMyMeds Key/confirmation #/EOC [id number] Status is pending

## 2023-03-12 NOTE — Telephone Encounter (Signed)
noted 

## 2023-03-12 NOTE — Telephone Encounter (Signed)
PA request has been Submitted. New Encounter created for follow up. For additional info see Pharmacy Prior Auth telephone encounter from 03-12-2023.

## 2023-04-01 MED ORDER — OZEMPIC (2 MG/DOSE) 8 MG/3ML ~~LOC~~ SOPN: 2.0000 mg | PEN_INJECTOR | SUBCUTANEOUS | 2 refills | Status: DC

## 2023-04-01 NOTE — Telephone Encounter (Signed)
The denia letter does not state why 15 MG DOSE OF Rachel Riggs  was denied.  She has type 2 DM and MASH.  Please find out WHY;  and if the lower dose OR A SWTICH TO 2 MG OZEMPIC WILL BE COVERED.  I HAVE SENT THE OZEMPIC DOSE TO PHARMACY

## 2023-04-01 NOTE — Telephone Encounter (Signed)
Pharmacy Patient Advocate Encounter  Received notification from [employer] that Prior Authorization for Mounjaro 15mg /0.109ml has been DENIED. Please advise how you'd like to proceed. Full denial letter will be uploaded to the media tab. See denial reason below.    PA #/Case ID/Reference #: [id number]

## 2023-04-01 NOTE — Addendum Note (Signed)
Addended by: Sherlene Shams on: 04/01/2023 12:30 PM   Modules accepted: Orders

## 2023-04-02 ENCOUNTER — Other Ambulatory Visit (HOSPITAL_COMMUNITY): Payer: Self-pay

## 2023-04-02 NOTE — Addendum Note (Signed)
Addended by: Sherlene Shams on: 04/02/2023 12:53 PM   Modules accepted: Orders

## 2023-04-02 NOTE — Telephone Encounter (Signed)
Ran test claim for both strengths of Mounjaro, per test claim, both are covered and should cost the patient $150.00 without coupon. Called pt's preferred pharmacy ([facility]) and spoke with someone who stated that there must be a mistake in the system as it looks like it was run through [employer] card, but pt is able to use insurance and copay card, bringing the cost down to $25.00 a month for her Mounjaro, please disregard PA attempts, not sure why we received a denial for a covered medication, my apologies. Pt last picked up medication 03/16/2023

## 2023-04-16 ENCOUNTER — Ambulatory Visit: Payer: BC Managed Care – PPO | Admitting: Internal Medicine

## 2023-06-10 ENCOUNTER — Ambulatory Visit: Payer: BC Managed Care – PPO | Admitting: Internal Medicine

## 2023-06-10 ENCOUNTER — Encounter: Payer: Self-pay | Admitting: Internal Medicine

## 2023-06-10 ENCOUNTER — Ambulatory Visit (INDEPENDENT_AMBULATORY_CARE_PROVIDER_SITE_OTHER): Payer: PRIVATE HEALTH INSURANCE | Admitting: Internal Medicine

## 2023-06-10 VITALS — BP 118/74 | HR 83 | Temp 97.9°F | Ht 67.0 in | Wt 191.6 lb

## 2023-06-10 DIAGNOSIS — Z7984 Long term (current) use of oral hypoglycemic drugs: Secondary | ICD-10-CM

## 2023-06-10 DIAGNOSIS — E782 Mixed hyperlipidemia: Secondary | ICD-10-CM | POA: Diagnosis not present

## 2023-06-10 DIAGNOSIS — K7581 Nonalcoholic steatohepatitis (NASH): Secondary | ICD-10-CM

## 2023-06-10 DIAGNOSIS — Z79899 Other long term (current) drug therapy: Secondary | ICD-10-CM

## 2023-06-10 DIAGNOSIS — Z7985 Long-term (current) use of injectable non-insulin antidiabetic drugs: Secondary | ICD-10-CM

## 2023-06-10 DIAGNOSIS — E1169 Type 2 diabetes mellitus with other specified complication: Secondary | ICD-10-CM | POA: Diagnosis not present

## 2023-06-10 DIAGNOSIS — E669 Obesity, unspecified: Secondary | ICD-10-CM

## 2023-06-10 LAB — LDL CHOLESTEROL, DIRECT: Direct LDL: 68 mg/dL

## 2023-06-10 LAB — COMPREHENSIVE METABOLIC PANEL
ALT: 18 U/L (ref 0–35)
AST: 23 U/L (ref 0–37)
Albumin: 4.5 g/dL (ref 3.5–5.2)
Alkaline Phosphatase: 121 U/L — ABNORMAL HIGH (ref 39–117)
BUN: 13 mg/dL (ref 6–23)
CO2: 27 meq/L (ref 19–32)
Calcium: 9.9 mg/dL (ref 8.4–10.5)
Chloride: 100 meq/L (ref 96–112)
Creatinine, Ser: 0.78 mg/dL (ref 0.40–1.20)
GFR: 81.22 mL/min (ref >60.00)
Glucose, Bld: 81 mg/dL (ref 70–99)
Potassium: 4.2 meq/L (ref 3.5–5.1)
Sodium: 136 meq/L (ref 135–145)
Total Bilirubin: 1.9 mg/dL — ABNORMAL HIGH (ref 0.2–1.2)
Total Protein: 7.4 g/dL (ref 6.0–8.3)

## 2023-06-10 LAB — MICROALBUMIN / CREATININE URINE RATIO
Creatinine,U: 96.6 mg/dL
Microalb Creat Ratio: 0.7 mg/g (ref 0.0–30.0)
Microalb, Ur: <0.7 mg/dL (ref 0.0–1.9)

## 2023-06-10 LAB — LIPID PANEL
Cholesterol: 150 mg/dL (ref 0–200)
HDL: 66.9 mg/dL (ref >39.00)
LDL Cholesterol: 68 mg/dL (ref 0–99)
NonHDL: 83.03
Total CHOL/HDL Ratio: 2
Triglycerides: 76 mg/dL (ref 0.0–149.0)
VLDL: 15.2 mg/dL (ref 0.0–40.0)

## 2023-06-10 LAB — TSH: TSH: 2.65 u[IU]/mL (ref 0.35–5.50)

## 2023-06-10 MED ORDER — TIRZEPATIDE 15 MG/0.5ML ~~LOC~~ SOAJ: 15.0000 mg | SUBCUTANEOUS | 2 refills | Status: DC

## 2023-06-10 MED ORDER — METFORMIN HCL 500 MG PO TABS: ORAL_TABLET | ORAL | 1 refills | Status: DC

## 2023-06-10 NOTE — Patient Instructions (Addendum)
Excellent work on the  weight ;  the blood pressure is also excellent!  No changes today.  See you in 6 months !

## 2023-06-10 NOTE — Progress Notes (Unsigned)
Subjective:  Patient ID: Rachel Riggs, female    DOB: 12-04-1960  Age: 62 y.o. MRN: [id number]  CC: The primary encounter diagnosis was Mixed hyperlipidemia. Diagnoses of Type 2 diabetes mellitus with obesity (HCC), Long-term use of high-risk medication, and Metabolic dysfunction-associated steatohepatitis (MASH) were also pertinent to this visit.   HPI Rachel Riggs presents for  Chief Complaint  Patient presents with   Medical Management of Chronic Issues   Obesity/DM:  She  feels generally well, is  exercising regularly  and trying to lose weight. Checking  blood sugars less than once daily at variable times, usually only if she feels she may be having a hypoglycemic event. .  BS have been under 130 fasting and < 150 post prandially.  Denies any recent hypoglyemic events.  . Following a carbohydrate modified diet . Denies numbness, burning and tingling of extremities. Appetite is .  Diminished with Mounjaro.  She has lost 50 lbs so far using mounjaro , avoiding GMO and  processed foods.    Outpatient Medications Prior to Visit  Medication Sig Dispense Refill   atorvastatin (LIPITOR) 20 MG tablet TAKE ONE TABLET BY MOUTH ONE TIME DAILY 90 tablet 3   EPINEPHrine 0.3 mg/0.3 mL IJ SOAJ injection Inject 0.3 mg into the muscle as needed for anaphylaxis. 1 each 1   etodolac (LODINE) 500 MG tablet Take 1 tablet (500 mg total) by mouth 2 (two) times daily. 60 tablet 0   famotidine (PEPCID) 20 MG tablet Take 1 tablet (20 mg total) by mouth 2 (two) times daily. 60 tablet 11   levocetirizine (XYZAL) 5 MG tablet Take 5 mg by mouth every evening.     meclizine (ANTIVERT) 25 MG tablet Take 1 tablet (25 mg total) by mouth 3 (three) times daily as needed. 90 tablet 0   montelukast (SINGULAIR) 10 MG tablet TAKE ONE TABLET BY MOUTH ONE TIME DAILY AT BEDTIME 90 tablet 3   VENTOLIN HFA 108 (90 Base) MCG/ACT inhaler Inhale 2 puffs into the lungs every 6 (six) hours as needed for wheezing or shortness of  breath. 18 g 0   benzonatate (TESSALON) 100 MG capsule Take 1 capsule (100 mg total) by mouth 3 (three) times daily as needed. 30 capsule 0   butalbital-acetaminophen-caffeine (FIORICET) 50-325-40 MG tablet TAKE 1 TO 2 TABLETS BY MOUTH EVERY 6 HOURS AS NEEDED FOR HEADACHE 60 tablet 2   metFORMIN (GLUCOPHAGE) 500 MG tablet TAKE ONE TABLET BY MOUTH TWICE A DAY WITH A MEAL 180 tablet 1   tirzepatide (MOUNJARO) 15 MG/0.5ML Pen Inject 15 mg into the skin once a week. 6 mL 2   triamcinolone cream (KENALOG) 0.1 % Apply 1 application topically 2 (two) times daily. To rash until resolved. 85 g 0   No facility-administered medications prior to visit.    Review of Systems;  Patient denies headache, fevers, malaise, unintentional weight loss, skin rash, eye pain, sinus congestion and sinus pain, sore throat, dysphagia,  hemoptysis , cough, dyspnea, wheezing, chest pain, palpitations, orthopnea, edema, abdominal pain, nausea, melena, diarrhea, constipation, flank pain, dysuria, hematuria, urinary  Frequency, nocturia, numbness, tingling, seizures,  Focal weakness, Loss of consciousness,  Tremor, insomnia, depression, anxiety, and suicidal ideation.      Objective:  BP 118/74   Pulse 83   Temp 97.9 F (36.6 C)   Ht 5\' 7"  (1.702 m)   Wt 191 lb 9.6 oz (86.9 kg)   LMP 01/03/2018   SpO2 99%   BMI 30.01 kg/m  BP Readings from Last 3 Encounters:  06/10/23 118/74  10/08/22 108/68  09/12/22 99/68    Wt Readings from Last 3 Encounters:  06/10/23 191 lb 9.6 oz (86.9 kg)  10/08/22 231 lb (104.8 kg)  09/12/22 229 lb 3.2 oz (104 kg)    Physical Exam  Lab Results  Component Value Date   HGBA1C 5.3 06/10/2023   HGBA1C 6.5 12/30/2022   HGBA1C 6.0 07/09/2022    Lab Results  Component Value Date   CREATININE 0.78 06/10/2023   CREATININE 0.91 12/30/2022   CREATININE 0.77 07/09/2022    Lab Results  Component Value Date   WBC 8.0 06/10/2023   HGB 13.1 06/10/2023   HCT 40.5 06/10/2023   PLT  300.0 06/10/2023   GLUCOSE 81 06/10/2023   CHOL 150 06/10/2023   TRIG 76.0 06/10/2023   HDL 66.90 06/10/2023   LDLDIRECT 68.0 06/10/2023   LDLCALC 68 06/10/2023   ALT 18 06/10/2023   AST 23 06/10/2023   NA 136 06/10/2023   K 4.2 06/10/2023   CL 100 06/10/2023   CREATININE 0.78 06/10/2023   BUN 13 06/10/2023   CO2 27 06/10/2023   TSH 2.65 06/10/2023   HGBA1C 5.3 06/10/2023   MICROALBUR <0.7 06/10/2023    Assessment & Plan:  .Mixed hyperlipidemia Assessment & Plan: Tolerating atorvastatin . Hepatic enzymes are normal.  T Bili and alk phos elevation are asymptomatic and likely due to h/0 MASH.  Lab Results  Component Value Date   CHOL 150 06/10/2023   HDL 66.90 06/10/2023   LDLCALC 68 06/10/2023   LDLDIRECT 68.0 06/10/2023   TRIG 76.0 06/10/2023   CHOLHDL 2 06/10/2023   Lab Results  Component Value Date   ALT 18 06/10/2023   AST 23 06/10/2023   ALKPHOS 121 (H) 06/10/2023   BILITOT 1.9 (H) 06/10/2023            Orders: -     Lipid panel -     LDL cholesterol, direct  Type 2 diabetes mellitus with obesity (HCC) Assessment & Plan: Excellent reduction in A1c with low GI diet  .  foot exam was normal . today.  There is  no proteinuria .   Continue metformin and  Mounjaro 15 mg weekly and Continue atorvastatin for primary prevention     Lab Results  Component Value Date   HGBA1C 5.3 06/10/2023   Lab Results  Component Value Date   MICROALBUR <0.7 06/10/2023   MICROALBUR 1.4 07/09/2022        Orders: -     metFORMIN HCl; TAKE ONE TABLET BY MOUTH TWICE A DAY WITH A MEAL  Dispense: 180 tablet; Refill: 1 -     Microalbumin / creatinine urine ratio -     Hemoglobin A1c -     Comprehensive metabolic panel  Long-term use of high-risk medication -     TSH -     CBC with Differential/Platelet  Metabolic dysfunction-associated steatohepatitis (MASH) Assessment & Plan: Presumed by ultrasound changes and serologies negative for autoimmune causes of  hepatitis. In 2022 .   Alk phos is mildly elevated,  all modifiable risk factors including obesity, diabetes and hyperlipidemia have been addressed . Continue metformin atorvastatin and mounjaro.    Other orders -     Tirzepatide; Inject 15 mg into the skin once a week.  Dispense: 6 mL; Refill: 2      Follow-up: No follow-ups on file.   Sherlene Shams, MD

## 2023-06-11 ENCOUNTER — Ambulatory Visit: Payer: BC Managed Care – PPO | Admitting: Internal Medicine

## 2023-06-11 ENCOUNTER — Encounter: Payer: Self-pay | Admitting: Internal Medicine

## 2023-06-11 LAB — CBC WITH DIFFERENTIAL/PLATELET
Basophils Absolute: 0 10*3/uL (ref 0.0–0.1)
Basophils Relative: 0.5 % (ref 0.0–3.0)
Eosinophils Absolute: 0.2 10*3/uL (ref 0.0–0.7)
Eosinophils Relative: 2.1 % (ref 0.0–5.0)
HCT: 40.5 % (ref 36.0–46.0)
Hemoglobin: 13.1 g/dL (ref 12.0–15.0)
Lymphocytes Relative: 38.4 % (ref 12.0–46.0)
Lymphs Abs: 3.1 10*3/uL (ref 0.7–4.0)
MCHC: 32.4 g/dL (ref 30.0–36.0)
MCV: 86.4 fL (ref 78.0–100.0)
Monocytes Absolute: 0.7 10*3/uL (ref 0.1–1.0)
Monocytes Relative: 8.4 % (ref 3.0–12.0)
Neutro Abs: 4.1 10*3/uL (ref 1.4–7.7)
Neutrophils Relative %: 50.6 % (ref 43.0–77.0)
Platelets: 300 10*3/uL (ref 150.0–400.0)
RBC: 4.68 Mil/uL (ref 3.87–5.11)
RDW: 15 % (ref 11.5–15.5)
WBC: 8 10*3/uL (ref 4.0–10.5)

## 2023-06-11 LAB — HEMOGLOBIN A1C: Hgb A1c MFr Bld: 5.3 % (ref 4.6–6.5)

## 2023-06-11 NOTE — Assessment & Plan Note (Addendum)
Presumed by ultrasound changes and serologies negative for autoimmune causes of hepatitis. In 2022 .   Alk phos is mildly elevated,  all modifiable risk factors including obesity, diabetes and hyperlipidemia have been addressed . Continue metformin atorvastatin and mounjaro.

## 2023-06-11 NOTE — Assessment & Plan Note (Signed)
Excellent reduction in A1c with low GI diet  .  foot exam was normal . today.  There is  no proteinuria .   Continue metformin and  Mounjaro 15 mg weekly and Continue atorvastatin for primary prevention     Lab Results  Component Value Date   HGBA1C 5.3 06/10/2023   Lab Results  Component Value Date   MICROALBUR <0.7 06/10/2023   MICROALBUR 1.4 07/09/2022

## 2023-06-11 NOTE — Assessment & Plan Note (Addendum)
Tolerating atorvastatin . Hepatic enzymes are normal.  T Bili and alk phos elevation are asymptomatic and likely due to h/0 MASH.  Lab Results  Component Value Date   CHOL 150 06/10/2023   HDL 66.90 06/10/2023   LDLCALC 68 06/10/2023   LDLDIRECT 68.0 06/10/2023   TRIG 76.0 06/10/2023   CHOLHDL 2 06/10/2023   Lab Results  Component Value Date   ALT 18 06/10/2023   AST 23 06/10/2023   ALKPHOS 121 (H) 06/10/2023   BILITOT 1.9 (H) 06/10/2023

## 2023-07-27 ENCOUNTER — Other Ambulatory Visit: Payer: Self-pay | Admitting: Internal Medicine

## 2023-11-21 ENCOUNTER — Encounter: Payer: Self-pay | Admitting: Internal Medicine

## 2023-11-21 ENCOUNTER — Telehealth: Payer: PRIVATE HEALTH INSURANCE | Admitting: Nurse Practitioner

## 2023-11-21 DIAGNOSIS — B9789 Other viral agents as the cause of diseases classified elsewhere: Secondary | ICD-10-CM | POA: Diagnosis not present

## 2023-11-21 DIAGNOSIS — J019 Acute sinusitis, unspecified: Secondary | ICD-10-CM | POA: Diagnosis not present

## 2023-11-21 MED ORDER — IPRATROPIUM BROMIDE 0.03 % NA SOLN: 2.0000 | Freq: Two times a day (BID) | NASAL | 12 refills | Status: DC

## 2023-11-21 NOTE — Progress Notes (Signed)
 E-Visit for Sinus Problems  We are sorry that you are not feeling well.  Here is how we plan to help!  Providers prescribe antibiotics to treat infections caused by bacteria. Antibiotics are very powerful in treating bacterial infections when they are used properly. To maintain their effectiveness, they should be used only when necessary. Overuse of antibiotics has resulted in the development of superbugs that are resistant to treatment   After careful review of your answers, I would not recommend an antibiotic for your condition.  Antibiotics are not effective against viruses and therefore should not be used to treat them. Common examples of infections caused by viruses include colds and flu  If you are not feeling better in 7 days or greater please feel free to reach back out to us  for consideration of an antibiotic prescription.   Based on what you have shared with me it looks like you have sinusitis.  Sinusitis is inflammation and infection in the sinus cavities of the head.  Based on your presentation I believe you most likely have Acute Viral Sinusitis.This is an infection most likely caused by a virus. There is not specific treatment for viral sinusitis other than to help you with the symptoms until the infection runs its course.  You may use an oral decongestant such as Mucinex D or if you have glaucoma or high blood pressure use plain Mucinex. Saline nasal spray help and can safely be used as often as needed for congestion, I have prescribed: Ipratropium Bromide  nasal spray 0.03% 2 sprays in eah nostril 2-3 times a day  Some authorities believe that zinc sprays or the use of Echinacea may shorten the course of your symptoms.  Sinus infections are not as easily transmitted as other respiratory infection, however we still recommend that you avoid close contact with loved ones, especially the very young and elderly.  Remember to wash your hands thoroughly throughout the day as this is the number  one way to prevent the spread of infection!  Home Care: Only take medications as instructed by your medical team. Do not take these medications with alcohol. A steam or ultrasonic humidifier can help congestion.  You can place a towel over your head and breathe in the steam from hot water coming from a faucet. Avoid close contacts especially the very young and the elderly. Cover your mouth when you cough or sneeze. Always remember to wash your hands.  Get Help Right Away If: You develop worsening fever or sinus pain. You develop a severe head ache or visual changes. Your symptoms persist after you have completed your treatment plan.  Make sure you Understand these instructions. Will watch your condition. Will get help right away if you are not doing well or get worse.   Thank you for choosing an e-visit.  Your e-visit answers were reviewed by a board certified advanced clinical practitioner to complete your personal care plan. Depending upon the condition, your plan could have included both over the counter or prescription medications.  Please review your pharmacy choice. Make sure the pharmacy is open so you can pick up prescription now. If there is a problem, you may contact your provider through [employer] and have the prescription routed to another pharmacy.  Your safety is important to us . If you have drug allergies check your prescription carefully.   For the next 24 hours you can use MyChart to ask questions about today's visit, request a non-urgent call back, or ask for a work or  school excuse. You will get an email in the next two days asking about your experience. I hope that your e-visit has been valuable and will speed your recovery.

## 2023-11-21 NOTE — Progress Notes (Signed)
 I have spent 5 minutes in review of e-visit questionnaire, review and updating patient chart, medical decision making and response to patient.   Claiborne Rigg, NP

## 2023-11-23 ENCOUNTER — Ambulatory Visit: Payer: Self-pay | Admitting: *Deleted

## 2023-11-23 ENCOUNTER — Encounter: Payer: Self-pay | Admitting: Internal Medicine

## 2023-11-23 ENCOUNTER — Telehealth: Payer: PRIVATE HEALTH INSURANCE | Admitting: Internal Medicine

## 2023-11-23 VITALS — BP 118/74 | Ht 67.0 in | Wt 196.0 lb

## 2023-11-23 DIAGNOSIS — J0100 Acute maxillary sinusitis, unspecified: Secondary | ICD-10-CM

## 2023-11-23 MED ORDER — DOXYCYCLINE HYCLATE 100 MG PO TABS
100.0000 mg | ORAL_TABLET | Freq: Two times a day (BID) | ORAL | 0 refills | Status: DC
Start: 2023-11-23 — End: 2024-01-17

## 2023-11-23 NOTE — Telephone Encounter (Signed)
 Pt saw Dr. Narendra today.

## 2023-11-23 NOTE — Telephone Encounter (Signed)
 Copied from CRM [id number]. Topic: Clinical - Red Word Triage >> Nov 23, 2023  7:54 AM Varney Gentleman wrote: Red Word that prompted transfer to Nurse Triage: Seen on Saturday getting worse massive headache and congestion couldn't stand up yesterday, nauseous temp yesterday. Reason for Disposition  [1] Sinus pain (not just congestion) AND [2] fever  Answer Assessment - Initial Assessment Questions 1. LOCATION: "Where does it hurt?"      I have a bad sinus infection.   I called Sat. To get an antibiotic and Dr. Madelon Scheuermann didn't feel I needed one so she didn't call one in. I'm having nausea from the post nasal drip, headache.   I can't seem to get past it.    I have green mucus and a lot of infection.    I feel like I have a low grade fever.   2. ONSET: "When did the sinus pain start?"  (e.g., hours, days)      I think is was last Mon or Tues. 3. SEVERITY: "How bad is the pain?"   (Scale 1-10; mild, moderate or severe)   - MILD (1-3): doesn't interfere with normal activities    - MODERATE (4-7): interferes with normal activities (e.g., work or school) or awakens from sleep   - SEVERE (8-10): excruciating pain and patient unable to do any normal activities        Severe.   I got worse on Friday and then the congestion set in. 4. RECURRENT SYMPTOM: "Have you ever had sinus problems before?" If Yes, ask: "When was the last time?" and "What happened that time?"      No   I tend to get bronchitis 5. NASAL CONGESTION: "Is the nose blocked?" If Yes, ask: "Can you open it or must you breathe through your mouth?"     Yes I'm having pain in my teeth and a lot of pressure around my eyes. 6. NASAL DISCHARGE: "Do you have discharge from your nose?" If so ask, "What color?"     Yes green mucus 7. FEVER: "Do you have a fever?" If Yes, ask: "What is it, how was it measured, and when did it start?"      Low grade 8. OTHER SYMPTOMS: "Do you have any other symptoms?" (e.g., sore throat, cough, earache, difficulty  breathing)     My ear pain has subsided.   THe headache and congestion and nausea form the congestion. 9. PREGNANCY: "Is there any chance you are pregnant?" "When was your last menstrual period?"     N/A  Protocols used: Sinus Pain or Congestion-A-AH  Chief Complaint: Sinus infection Symptoms: Bad headache, a lot of nasal congestion, pressure around her eyes, low grade fever and a lot of post nasal drip. Frequency: Since last Mon or Tues. Pertinent Negatives: Patient denies getting any better. Disposition: [] ED /[] Urgent Care (no appt availability in office) / [x] Appointment(In office/virtual)/ []  [facility]/ [] Home Care/ [] Refused Recommended Disposition /[] [facility]/ []  Follow-up with PCP Additional Notes: Video visit with Dr. Nirandra scheduled for this morning at 11:40.

## 2023-11-23 NOTE — Assessment & Plan Note (Signed)
-   Patient presents today with acute sinusitis symptoms.  Symptoms initially began with rhinorrhea and nasal congestion and progressed to right ear fullness as well as right maxillary sinus tenderness and headaches worsening when she bends her head forward.  She also complains of low-grade fever last night. -Patient's symptoms are consistent with an acute sinusitis -Will treat the patient with doxycycline  100 mg twice daily (patient is allergic to penicillins) to complete a 7-day course -She was advised to continue with conservative measures including warm showers with steam inhalation, saline nasal rinses and Flonase as well as levo cetirizine -Return precautions given to the patient

## 2023-11-23 NOTE — Telephone Encounter (Signed)
 FYI

## 2023-11-23 NOTE — Progress Notes (Signed)
 Virtual Visit via Video Note  I connected withNAME@ on 11/23/23 at 11:40 AM EDT by a video enabled telemedicine application and verified that I am speaking with the correct person using two identifiers.  Location patient: home Location provider:work or home office Persons participating in the virtual visit: patient, provider  I discussed the limitations of evaluation and management by telemedicine and the availability of in person appointments. The patient expressed understanding and agreed to proceed.   HPI:  Patient is in today for acute sinusitis.  Patient states that last Tuesday she developed nasal congestion and rhinorrhea as well as sneezing.  She states that she then developed fullness in her maxillary sinuses especially on the right and associated headaches which are worse when she bends her head forward.  She also developed a low-grade fever beginning last night.  She denies any cough or sore throat.  She also had sick contacts last week (family visited and had ear infection)  ROS: See pertinent positives and negatives per HPI.  Past Medical History:  Diagnosis Date   Asthma    Diabetes mellitus without complication (HCC)    Fatty liver 2022   Hyperlipidemia    Migraines    Otitis media 03/30/2021    Past Surgical History:  Procedure Laterality Date   COLONOSCOPY WITH PROPOFOL  N/A 09/12/2022   Procedure: COLONOSCOPY WITH PROPOFOL ;  Surgeon: Selena Daily, MD;  Location: [facility] ENDOSCOPY;  Service: Gastroenterology;  Laterality: N/A;   HERNIA REPAIR  1991    Family History  Problem Relation Age of Onset   Parkinsonism Mother 99   Cancer Mother        uterus   Heart failure Mother    Cancer Father 76       NHL   Hypertension Father    Hyperlipidemia Father    Diabetes Father    Heart disease Father 52       CAD s/p 4 vessel CABG   Cancer Brother 41       prostate CA   Diabetes Maternal Grandmother    Stroke Maternal Grandmother    Heart disease Paternal  Grandfather    COPD Paternal Grandfather    Breast cancer Neg Hx       Current Outpatient Medications:    atorvastatin  (LIPITOR) 20 MG tablet, TAKE ONE TABLET BY MOUTH ONE TIME DAILY, Disp: 90 tablet, Rfl: 3   doxycycline  (VIBRA -TABS) 100 MG tablet, Take 1 tablet (100 mg total) by mouth 2 (two) times daily., Disp: 14 tablet, Rfl: 0   EPINEPHrine  0.3 mg/0.3 mL IJ SOAJ injection, Inject 0.3 mg into the muscle as needed for anaphylaxis., Disp: 1 each, Rfl: 1   etodolac  (LODINE ) 500 MG tablet, Take 1 tablet (500 mg total) by mouth 2 (two) times daily., Disp: 60 tablet, Rfl: 0   famotidine  (PEPCID ) 20 MG tablet, Take 1 tablet (20 mg total) by mouth 2 (two) times daily., Disp: 60 tablet, Rfl: 11   ipratropium (ATROVENT ) 0.03 % nasal spray, Place 2 sprays into both nostrils every 12 (twelve) hours., Disp: 30 mL, Rfl: 12   levocetirizine (XYZAL) 5 MG tablet, Take 5 mg by mouth every evening., Disp: , Rfl:    meclizine  (ANTIVERT ) 25 MG tablet, Take 1 tablet (25 mg total) by mouth 3 (three) times daily as needed., Disp: 90 tablet, Rfl: 0   metFORMIN  (GLUCOPHAGE ) 500 MG tablet, TAKE ONE TABLET BY MOUTH TWICE A DAY WITH A MEAL, Disp: 180 tablet, Rfl: 1   montelukast  (SINGULAIR ) 10  MG tablet, TAKE ONE TABLET BY MOUTH ONE TIME DAILY AT BEDTIME, Disp: 90 tablet, Rfl: 3   tirzepatide  (MOUNJARO ) 15 MG/0.5ML Pen, Inject 15 mg into the skin once a week., Disp: 6 mL, Rfl: 2   VENTOLIN  HFA 108 (90 Base) MCG/ACT inhaler, Inhale 2 puffs into the lungs every 6 (six) hours as needed for wheezing or shortness of breath., Disp: 18 g, Rfl: 0  EXAM:  VITALS per patient if applicable:  GENERAL: alert, oriented, appears well and in no acute distress  HEENT: atraumatic, conjunctiva clear, no obvious abnormalities on inspection of external nose and ears, patient with tenderness when she palpates her right maxillary sinus as well as her right frontal sinus  NECK: normal movements of the head and neck  LUNGS: on  inspection no signs of respiratory distress, breathing rate appears normal, no obvious gross SOB, gasping or wheezing  CV: no obvious cyanosis  MS: moves all visible extremities without noticeable abnormality  PSYCH/NEURO: pleasant and cooperative, no obvious depression or anxiety, speech and thought processing grossly intact  ASSESSMENT AND PLAN:  Discussed the following assessment and plan:  Acute non-recurrent maxillary sinusitis - Plan: doxycycline  (VIBRA -TABS) 100 MG tablet     I discussed the assessment and treatment plan with the patient. The patient was provided an opportunity to ask questions and all were answered. The patient agreed with the plan and demonstrated an understanding of the instructions.   The patient was advised to call back or seek an in-person evaluation if the symptoms worsen or if the condition fails to improve as anticipated.     Meira Wahba, MD

## 2023-12-15 ENCOUNTER — Encounter: Payer: Self-pay | Admitting: Internal Medicine

## 2023-12-15 MED ORDER — PREDNISONE 10 MG PO TABS: ORAL_TABLET | ORAL | 0 refills | Status: DC

## 2023-12-18 ENCOUNTER — Other Ambulatory Visit: Payer: Self-pay | Admitting: Internal Medicine

## 2023-12-18 DIAGNOSIS — E669 Obesity, unspecified: Secondary | ICD-10-CM

## 2024-01-01 LAB — HM DIABETES EYE EXAM

## 2024-01-06 ENCOUNTER — Emergency Department: Payer: PRIVATE HEALTH INSURANCE

## 2024-01-06 ENCOUNTER — Other Ambulatory Visit: Payer: Self-pay

## 2024-01-06 ENCOUNTER — Emergency Department
Admission: EM | Admit: 2024-01-06 | Discharge: 2024-01-06 | Disposition: A | Discharge status: Home / Self Care | Payer: PRIVATE HEALTH INSURANCE | Attending: Emergency Medicine | Admitting: Emergency Medicine

## 2024-01-06 DIAGNOSIS — R1012 Left upper quadrant pain: Secondary | ICD-10-CM | POA: Diagnosis present

## 2024-01-06 DIAGNOSIS — R519 Headache, unspecified: Secondary | ICD-10-CM | POA: Diagnosis not present

## 2024-01-06 DIAGNOSIS — N644 Mastodynia: Secondary | ICD-10-CM | POA: Insufficient documentation

## 2024-01-06 DIAGNOSIS — Y92410 Unspecified street and highway as the place of occurrence of the external cause: Secondary | ICD-10-CM | POA: Diagnosis not present
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
## 2024-01-06 NOTE — Discharge Instructions (Signed)

## 2024-01-11 ENCOUNTER — Encounter: Payer: Self-pay | Admitting: Internal Medicine

## 2024-01-15 ENCOUNTER — Encounter: Payer: Self-pay | Admitting: Internal Medicine

## 2024-01-15 ENCOUNTER — Ambulatory Visit: Payer: PRIVATE HEALTH INSURANCE | Admitting: Internal Medicine

## 2024-01-15 VITALS — BP 110/80 | HR 87 | Ht 67.0 in | Wt 198.0 lb

## 2024-01-15 DIAGNOSIS — J4521 Mild intermittent asthma with (acute) exacerbation: Secondary | ICD-10-CM

## 2024-01-15 DIAGNOSIS — E669 Obesity, unspecified: Secondary | ICD-10-CM

## 2024-01-15 DIAGNOSIS — J0100 Acute maxillary sinusitis, unspecified: Secondary | ICD-10-CM | POA: Diagnosis not present

## 2024-01-15 DIAGNOSIS — E1169 Type 2 diabetes mellitus with other specified complication: Secondary | ICD-10-CM

## 2024-01-15 DIAGNOSIS — S134XXA Sprain of ligaments of cervical spine, initial encounter: Secondary | ICD-10-CM

## 2024-01-15 DIAGNOSIS — E782 Mixed hyperlipidemia: Secondary | ICD-10-CM

## 2024-01-15 DIAGNOSIS — E66812 Obesity, class 2: Secondary | ICD-10-CM

## 2024-01-15 DIAGNOSIS — L509 Urticaria, unspecified: Secondary | ICD-10-CM

## 2024-01-15 DIAGNOSIS — Z6835 Body mass index (BMI) 35.0-35.9, adult: Secondary | ICD-10-CM

## 2024-01-15 DIAGNOSIS — K7581 Nonalcoholic steatohepatitis (NASH): Secondary | ICD-10-CM

## 2024-01-15 DIAGNOSIS — Z1231 Encounter for screening mammogram for malignant neoplasm of breast: Secondary | ICD-10-CM

## 2024-01-15 MED ORDER — TIRZEPATIDE 15 MG/0.5ML ~~LOC~~ SOAJ: 15.0000 mg | SUBCUTANEOUS | 2 refills | Status: DC

## 2024-01-15 MED ORDER — CYCLOBENZAPRINE HCL 10 MG PO TABS: 10.0000 mg | ORAL_TABLET | Freq: Three times a day (TID) | ORAL | 0 refills | Status: AC | PRN

## 2024-01-15 MED ORDER — FAMOTIDINE 20 MG PO TABS: 20.0000 mg | ORAL_TABLET | Freq: Two times a day (BID) | ORAL | 11 refills | Status: AC

## 2024-01-15 MED ORDER — PREDNISONE 10 MG PO TABS: ORAL_TABLET | ORAL | 0 refills | Status: DC
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
## 2024-01-15 NOTE — Patient Instructions (Signed)
 PREDNISONE  TAPER AND MUSCLE RELAXER SENT TO [facility]

## 2024-01-16 LAB — COMPREHENSIVE METABOLIC PANEL WITH GFR
ALT: 26 IU/L (ref 0–32)
AST: 27 IU/L (ref 0–40)
Albumin: 4.7 g/dL (ref 3.9–4.9)
Alkaline Phosphatase: 142 IU/L — ABNORMAL HIGH (ref 44–121)
BUN/Creatinine Ratio: 14 (ref 12–28)
BUN: 11 mg/dL (ref 8–27)
Bilirubin Total: 1.1 mg/dL (ref 0.0–1.2)
CO2: 19 mmol/L — ABNORMAL LOW (ref 20–29)
Calcium: 9.9 mg/dL (ref 8.7–10.3)
Chloride: 100 mmol/L (ref 96–106)
Creatinine, Ser: 0.81 mg/dL (ref 0.57–1.00)
Globulin, Total: 2.6 g/dL (ref 1.5–4.5)
Glucose: 76 mg/dL (ref 70–99)
Potassium: 4.8 mmol/L (ref 3.5–5.2)
Sodium: 140 mmol/L (ref 134–144)
Total Protein: 7.3 g/dL (ref 6.0–8.5)
eGFR: 82 mL/min/{1.73_m2} (ref >59)

## 2024-01-16 LAB — LIPID PANEL
Chol/HDL Ratio: 2.4 ratio (ref 0.0–4.4)
Cholesterol, Total: 138 mg/dL (ref 100–199)
HDL: 57 mg/dL (ref >39)
LDL Chol Calc (NIH): 65 mg/dL (ref 0–99)
Triglycerides: 82 mg/dL (ref 0–149)
VLDL Cholesterol Cal: 16 mg/dL (ref 5–40)

## 2024-01-16 LAB — LDL CHOLESTEROL, DIRECT: LDL Direct: 60 mg/dL (ref 0–99)

## 2024-01-16 LAB — HEMOGLOBIN A1C
Est. average glucose Bld gHb Est-mCnc: 105 mg/dL
Hgb A1c MFr Bld: 5.3 % (ref 4.8–5.6)

## 2024-01-17 ENCOUNTER — Ambulatory Visit: Payer: Self-pay | Admitting: Internal Medicine

## 2024-01-17 DIAGNOSIS — J4521 Mild intermittent asthma with (acute) exacerbation: Secondary | ICD-10-CM | POA: Insufficient documentation

## 2024-01-17 DIAGNOSIS — S134XXA Sprain of ligaments of cervical spine, initial encounter: Secondary | ICD-10-CM | POA: Insufficient documentation

## 2024-01-17 NOTE — Assessment & Plan Note (Signed)
 She is wheezing on exam and reports persistent non productive cough since her sinusitis .  Prednisone  taper given

## 2024-01-17 NOTE — Assessment & Plan Note (Signed)
 Presumed by ultrasound changes and serologies negative for autoimmune causes of hepatitis per screening in  2022 .   Alk phos is mildly elevated,  all modifiable risk factors including obesity, diabetes and hyperlipidemia have been addressed . Continue metformin  atorvastatin  and mounjaro .   Lab Results  Component Value Date   ALT 26 01/15/2024   AST 27 01/15/2024   ALKPHOS 142 (H) 01/15/2024   BILITOT 1.1 01/15/2024

## 2024-01-17 NOTE — Assessment & Plan Note (Addendum)
 Treated to resolution in late April by Dr Narendra with doxycycline 

## 2024-01-17 NOTE — Assessment & Plan Note (Signed)
 She is using mounjaro  for appetite suppression/diabetes management and exercising regularly
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
## 2024-01-17 NOTE — Assessment & Plan Note (Signed)
No recurrence since starting  a regimen of singulair , famotidine 20 mg bid and zyrtec for daily use as preventive

## 2024-01-17 NOTE — Assessment & Plan Note (Signed)
 Excellent reduction in A1c with low GI diet  and Mounjaro  w. .  foot exam was normal . today.   She has no proteinuria .   Continue metformin  and  Mounjaro  15 mg weekly and Continue atorvastatin  for primary prevention     Lab Results  Component Value Date   HGBA1C 5.3 01/15/2024   Lab Results  Component Value Date   MICROALBUR <0.7 06/10/2023   MICROALBUR 1.4 07/09/2022

## 2024-02-17 ENCOUNTER — Telehealth: Payer: PRIVATE HEALTH INSURANCE | Admitting: Internal Medicine

## 2024-02-17 ENCOUNTER — Ambulatory Visit: Payer: Self-pay

## 2024-02-17 ENCOUNTER — Encounter: Payer: Self-pay | Admitting: Internal Medicine

## 2024-02-17 VITALS — Temp 101.0°F | Ht 67.0 in | Wt 198.0 lb

## 2024-02-17 DIAGNOSIS — K529 Noninfective gastroenteritis and colitis, unspecified: Secondary | ICD-10-CM

## 2024-02-17 MED ORDER — AZITHROMYCIN 500 MG PO TABS: 500.0000 mg | ORAL_TABLET | Freq: Every day | ORAL | 0 refills | Status: AC

## 2024-02-17 MED ORDER — PREDNISONE 10 MG PO TABS: ORAL_TABLET | ORAL | 0 refills | Status: AC

## 2024-02-17 NOTE — Progress Notes (Signed)
 Virtual Visit via Caregility   Note   This format is felt to be most appropriate for this patient at this time.  All issues noted in this document were discussed and addressed.  No physical exam was performed (except for noted visual exam findings with Video Visits).   I connected with Rachel Riggs  on 02/17/24 at  2:00 PM EDT by a video enabled telemedicine application or telephone and verified that I am speaking with the correct person using two identifiers. Location patient: home Location provider: work or home office Persons participating in the virtual visit: patient, provider  I discussed the limitations, risks, security and privacy concerns of performing an evaluation and management service by telephone and the availability of in person appointments. I also discussed with the patient that there may be a patient responsible charge related to this service. The patient expressed understanding and agreed to proceed.  Reason for visit: FEVERS,  DIARRHEA   HPI:  63 yr old female with type 2 DM  asthma presents with SUDDEN ONSET OF LOOSE stools which started 48 hours ago accompanied by chills and temp of 103.9  also noted frontal headache,  sore throat and eary pain.  No sinus congestion , rhinorrhea , wheezing or cough.  No nausea or abdominal cramping.  Last stool was yesterday.  Has been taking tylenol which ahs alleviated th ear pain and sore tharot and brought temp to 101.  No recent travel,  no known sick contacts,  although she has had contact with her grandchildren who are in daycare.     ROS: See pertinent positives and negatives per HPI.  Past Medical History:  Diagnosis Date   Allergy    Asthma    Diabetes mellitus without complication (HCC)    Fatty liver 2022   Hyperlipidemia    Migraines    Otitis media 03/30/2021    Past Surgical History:  Procedure Laterality Date   COLONOSCOPY WITH PROPOFOL  N/A 09/12/2022   Procedure: COLONOSCOPY WITH PROPOFOL ;  Surgeon: Unk Corinn Skiff,  MD;  Location: [facility] ENDOSCOPY;  Service: Gastroenterology;  Laterality: N/A;   HERNIA REPAIR  1991    Family History  Problem Relation Age of Onset   Parkinsonism Mother 100   Cancer Mother        uterus   Heart failure Mother    Cancer Father 85       NHL   Hypertension Father    Hyperlipidemia Father    Diabetes Father    Heart disease Father 76       CAD s/p 4 vessel CABG   Cancer Brother 70       prostate CA   Diabetes Maternal Grandmother    Stroke Maternal Grandmother    Heart disease Paternal Grandfather    COPD Paternal Grandfather    Breast cancer Neg Hx     SOCIAL HX:  reports that she quit smoking about 39 years ago. Her smoking use included cigarettes. She has never used smokeless tobacco. She reports current alcohol use of about 1.0 standard drink of alcohol per week. She reports that she does not use drugs.    Current Outpatient Medications:    atorvastatin  (LIPITOR) 20 MG tablet, TAKE ONE TABLET BY MOUTH ONE TIME DAILY, Disp: 90 tablet, Rfl: 3   azithromycin  (ZITHROMAX ) 500 MG tablet, Take 1 tablet (500 mg total) by mouth daily., Disp: 7 tablet, Rfl: 0   cyclobenzaprine  (FLEXERIL ) 10 MG tablet, Take 1 tablet (10 mg total) by mouth 3 (  three) times daily as needed for muscle spasms., Disp: 30 tablet, Rfl: 0   EPINEPHrine  0.3 mg/0.3 mL IJ SOAJ injection, Inject 0.3 mg into the muscle as needed for anaphylaxis., Disp: 1 each, Rfl: 1   etodolac  (LODINE ) 500 MG tablet, Take 1 tablet (500 mg total) by mouth 2 (two) times daily., Disp: 60 tablet, Rfl: 0   famotidine  (PEPCID ) 20 MG tablet, Take 1 tablet (20 mg total) by mouth 2 (two) times daily., Disp: 60 tablet, Rfl: 11   levocetirizine (XYZAL) 5 MG tablet, Take 5 mg by mouth every evening., Disp: , Rfl:    meclizine  (ANTIVERT ) 25 MG tablet, Take 1 tablet (25 mg total) by mouth 3 (three) times daily as needed., Disp: 90 tablet, Rfl: 0   metFORMIN  (GLUCOPHAGE ) 500 MG tablet, TAKE ONE TABLET BY MOUTH TWICE A DAY WITH A  MEAL, Disp: 180 tablet, Rfl: 1   montelukast  (SINGULAIR ) 10 MG tablet, TAKE ONE TABLET BY MOUTH ONE TIME DAILY AT BEDTIME, Disp: 90 tablet, Rfl: 3   predniSONE  (DELTASONE ) 10 MG tablet, 6 tablets on Day 1 , then reduce by 1 tablet daily until gone, Disp: 21 tablet, Rfl: 0   tirzepatide  (MOUNJARO ) 15 MG/0.5ML Pen, Inject 15 mg into the skin once a week., Disp: 6 mL, Rfl: 2   VENTOLIN  HFA 108 (90 Base) MCG/ACT inhaler, Inhale 2 puffs into the lungs every 6 (six) hours as needed for wheezing or shortness of breath., Disp: 18 g, Rfl: 0  EXAM:  VITALS per patient if applicable:  GENERAL: alert, oriented, appears well and in no acute distress  HEENT: atraumatic, conjunttiva clear, no obvious abnormalities on inspection of external nose and ears  NECK: normal movements of the head and neck  LUNGS: on inspection no signs of respiratory distress, breathing rate appears normal, no obvious gross SOB, gasping or wheezing  CV: no obvious cyanosis  MS: moves all visible extremities without noticeable abnormality  PSYCH/NEURO: pleasant and cooperative, no obvious depression or anxiety, speech and thought processing grossly intact  ASSESSMENT AND PLAN: Gastroenteritis Assessment & Plan: vs summer flu.  Given ear pain and diarrhea Empiric treatment with azithromycin  and prednisone  taper. Return if not imporved in 48 hours.    Other orders -     Azithromycin ; Take 1 tablet (500 mg total) by mouth daily.  Dispense: 7 tablet; Refill: 0 -     predniSONE ; 6 tablets on Day 1 , then reduce by 1 tablet daily until gone  Dispense: 21 tablet; Refill: 0      I discussed the assessment and treatment plan with the patient. The patient was provided an opportunity to ask questions and all were answered. The patient agreed with the plan and demonstrated an understanding of the instructions.   The patient was advised to call back or seek an in-person evaluation if the symptoms worsen or if the condition fails to  improve as anticipated.   I spent 20 minutes dedicated to the care of this patient on the date of this virtual encounter to include pre-visit review of patient's medical history,   face-to-face time with the patient , and post visit ordering of testing and therapeutics.    Rachel LITTIE Kettering, MD

## 2024-02-17 NOTE — Telephone Encounter (Signed)
 FYI Only or Action Required?: FYI only for provider.  Patient was last seen in primary care on 01/15/2024 by Marylynn Verneita CROME, MD.  Called Nurse Triage reporting Fever.  Symptoms began x 2 days.  Interventions attempted: OTC medications: muccinex.  Symptoms are: unchanged.  Triage Disposition: Home Care  Patient/caregiver understands and will follow disposition?: Yes   Copied from CRM [id number]. Topic: Clinical - Red Word Triage >> Feb 17, 2024 12:04 PM Deleta RAMAN wrote: Red Word that prompted transfer to Nurse Triage: high fever, chills, ear ache, throat pain and headache. Patient also has weakness been experience for about 2 days Reason for Disposition  [1] Fever AND [2] no signs of serious infection or localizing symptoms (all other triage questions negative)  Headache  Answer Assessment - Initial Assessment Questions 1. TEMPERATURE: What is the most recent temperature?  How was it measured?     101, 102.7 2. ONSET: When did the fever start?      X 2 days 3. CHILLS: Do you have chills? If yes: How bad are they?  (e.g., none, mild, moderate, severe)     mild 4. OTHER SYMPTOMS: Do you have any other symptoms besides the fever?  (e.g., abdomen pain, cough, diarrhea, earache, headache, sore throat, urination pain)     high fever, chills, ear ache, throat pain and headache. 5. CAUSE: If there are no symptoms, ask: What do you think is causing the fever?      unknown 6. CONTACTS: Does anyone else in the family have an infection?     no 7. TREATMENT: What have you done so far to treat this fever? (e.g., OTC fever medicines)     tylenol 8. IMMUNOCOMPROMISE: Do you have any of the following: diabetes, HIV positive, splenectomy, cancer chemotherapy, chronic steroid treatment, transplant patient, etc.?     no 9. PREGNANCY: Is there any chance you are pregnant? When was your last menstrual period?     na 10. TRAVEL: Have you traveled out of the country in the  last month? (e.g., travel history, exposures)       no  Answer Assessment - Initial Assessment Questions 1. LOCATION: Where does it hurt?      Center of forehead : pt states possible sinus headache 2. ONSET: When did the headache start? (e.g., minutes, hours, days)      X 2 days 3. PATTERN: Does the pain come and go, or has it been constant since it started?     Comes and goes 4. SEVERITY: How bad is the pain? and What does it keep you from doing?  (e.g., Scale 1-10; mild, moderate, or severe)     Moderate 5. RECURRENT SYMPTOM: Have you ever had headaches before? If Yes, ask: When was the last time? and What happened that time?      no 6. CAUSE: What do you think is causing the headache?     sinus 7. MIGRAINE: Have you been diagnosed with migraine headaches? If Yes, ask: Is this headache similar?      no 8. HEAD INJURY: Has there been any recent injury to your head?      no 9. OTHER SYMPTOMS: Do you have any other symptoms? (e.g., fever, stiff neck, eye pain, sore throat, cold symptoms)     Sore throat,  10. PREGNANCY: Is there any chance you are pregnant? When was your last menstrual period?       na  Protocols used: Fever-A-AH, Headache-A-AH

## 2024-02-17 NOTE — Assessment & Plan Note (Signed)
 vs summer flu.  Given ear pain and diarrhea Empiric treatment with azithromycin  and prednisone  taper. Return if not imporved in 48 hours.

## 2024-02-17 NOTE — Telephone Encounter (Signed)
 Spoke with pt and she agreed to the virtual appt this afternoon at 2 pm. Pt has been scheduled.

## 2024-02-17 NOTE — Telephone Encounter (Signed)
 Can I use the 2 pm today for a virtual for this pt?

## 2024-02-18 ENCOUNTER — Encounter: Payer: Self-pay | Admitting: Internal Medicine

## 2024-02-27 ENCOUNTER — Other Ambulatory Visit: Payer: Self-pay | Admitting: Internal Medicine

## 2024-04-05 ENCOUNTER — Other Ambulatory Visit: Payer: Self-pay | Admitting: Internal Medicine

## 2024-04-12 ENCOUNTER — Encounter: Payer: Self-pay | Admitting: Internal Medicine

## 2024-04-14 NOTE — Telephone Encounter (Signed)
 Spoke with pt and scheduled her to see Chelsea Aurora, NP tomorrow.

## 2024-04-15 ENCOUNTER — Other Ambulatory Visit: Payer: Self-pay

## 2024-04-15 ENCOUNTER — Encounter: Payer: Self-pay | Admitting: Nurse Practitioner

## 2024-04-15 ENCOUNTER — Ambulatory Visit: Payer: PRIVATE HEALTH INSURANCE | Admitting: Family

## 2024-04-15 ENCOUNTER — Emergency Department
Admission: EM | Admit: 2024-04-15 | Discharge: 2024-04-15 | Disposition: A | Discharge status: Home / Self Care | Payer: PRIVATE HEALTH INSURANCE | Attending: Emergency Medicine | Admitting: Emergency Medicine

## 2024-04-15 ENCOUNTER — Ambulatory Visit: Payer: PRIVATE HEALTH INSURANCE | Admitting: Nurse Practitioner

## 2024-04-15 VITALS — BP 122/76 | HR 77 | Temp 97.9°F | Ht 67.0 in | Wt 201.4 lb

## 2024-04-15 DIAGNOSIS — R42 Dizziness and giddiness: Secondary | ICD-10-CM | POA: Diagnosis present

## 2024-04-15 DIAGNOSIS — I951 Orthostatic hypotension: Secondary | ICD-10-CM | POA: Diagnosis not present

## 2024-04-15 LAB — CBC WITH DIFFERENTIAL/PLATELET
Abs Immature Granulocytes: 0.01 K/uL (ref 0.00–0.07)
Basophils Absolute: 0 K/uL (ref 0.0–0.1)
Basophils Relative: 1 %
Eosinophils Absolute: 0.2 K/uL (ref 0.0–0.5)
Eosinophils Relative: 3 %
HCT: 41.6 % (ref 36.0–46.0)
Hemoglobin: 13.4 g/dL (ref 12.0–15.0)
Immature Granulocytes: 0 %
Lymphocytes Relative: 38 %
Lymphs Abs: 2.5 K/uL (ref 0.7–4.0)
MCH: 28 pg (ref 26.0–34.0)
MCHC: 32.2 g/dL (ref 30.0–36.0)
MCV: 86.8 fL (ref 80.0–100.0)
Monocytes Absolute: 0.5 K/uL (ref 0.1–1.0)
Monocytes Relative: 7 %
Neutro Abs: 3.4 K/uL (ref 1.7–7.7)
Neutrophils Relative %: 51 %
Platelets: 249 K/uL (ref 150–400)
RBC: 4.79 MIL/uL (ref 3.87–5.11)
RDW: 13.8 % (ref 11.5–15.5)
WBC: 6.6 K/uL (ref 4.0–10.5)
nRBC: 0 % (ref 0.0–0.2)

## 2024-04-15 LAB — COMPREHENSIVE METABOLIC PANEL WITH GFR
ALT: 18 U/L (ref 0–44)
AST: 22 U/L (ref 15–41)
Albumin: 4.3 g/dL (ref 3.5–5.0)
Alkaline Phosphatase: 88 U/L (ref 38–126)
Anion gap: 11 (ref 5–15)
BUN: 20 mg/dL (ref 8–23)
CO2: 25 mmol/L (ref 22–32)
Calcium: 9.4 mg/dL (ref 8.9–10.3)
Chloride: 101 mmol/L (ref 98–111)
Creatinine, Ser: 0.78 mg/dL (ref 0.44–1.00)
GFR, Estimated: >60 mL/min (ref >60)
Glucose, Bld: 181 mg/dL — ABNORMAL HIGH (ref 70–99)
Potassium: 4.3 mmol/L (ref 3.5–5.1)
Sodium: 137 mmol/L (ref 135–145)
Total Bilirubin: 1.7 mg/dL — ABNORMAL HIGH (ref 0.0–1.2)
Total Protein: 7.4 g/dL (ref 6.5–8.1)

## 2024-04-15 LAB — TROPONIN I (HIGH SENSITIVITY): Troponin I (High Sensitivity): <2 ng/L (ref <18)

## 2024-04-15 MED ORDER — SODIUM CHLORIDE 0.9 % IV BOLUS
1000.0000 mL | Freq: Once | INTRAVENOUS | Status: AC
  Administered 2024-04-15: 1000 mL via INTRAVENOUS

## 2024-04-15 NOTE — Progress Notes (Addendum)
 Established Patient Office Visit  Subjective:  Patient ID: Rachel Riggs, female    DOB: 19-Jul-1961  Age: 63 y.o. MRN: [id number]  CC:  Chief Complaint  Patient presents with   Acute Visit    Vertigo, inner ear, right ear popped once   Discussed the use of a AI scribe software for clinical note transcription with the patient, who gave verbal consent to proceed.  HPI  Rachel Riggs is a 63 year old female who presents with vertigo and visual disturbances.  Vertigo began on Sunday with a 'pop' in her ear, followed by dizziness. Meclizine  alleviated symptoms within 20 to 25 minutes. Vertigo recurred on Monday and Monday night, accompanied by gray and fuzzy central vision.  Vertigo persists, especially with positional changes, such as lying on her left side, causing the room to spin. The sensation is described as 'movement stop, movement stop' and is less severe during the day. She experiences nausea during episodes but no headaches. Visual disturbances have not recurred since Monday night.  Vertigo is triggered by head movements, particularly upon waking and moving her head quickly. She manages symptoms by moving slowly.  She had previous episodes of vertigo in the past but has not last for that long.  HPI   Past Medical History:  Diagnosis Date   Allergy    Asthma    Diabetes mellitus without complication (HCC)    Fatty liver 2022   Hyperlipidemia    Migraines    Otitis media 03/30/2021    Past Surgical History:  Procedure Laterality Date   COLONOSCOPY WITH PROPOFOL  N/A 09/12/2022   Procedure: COLONOSCOPY WITH PROPOFOL ;  Surgeon: Unk Corinn Skiff, MD;  [facility]: [facility] ENDOSCOPY;  Service: Gastroenterology;  Laterality: N/A;   HERNIA REPAIR  1991    Family History  Problem Relation Age of Onset   Parkinsonism Mother 66   Cancer Mother        uterus   Heart failure Mother    Cancer Father 32       NHL   Hypertension Father    Hyperlipidemia Father    Diabetes  Father    Heart disease Father 22       CAD s/p 4 vessel CABG   Cancer Brother 65       prostate CA   Diabetes Maternal Grandmother    Stroke Maternal Grandmother    Heart disease Paternal Grandfather    COPD Paternal Grandfather    Breast cancer Neg Hx     Social History   Socioeconomic History   Marital status: Widowed    Spouse name: Not on file   Number of children: Not on file   Years of education: Not on file   Highest education level: Not on file  Occupational History   Not on file  Tobacco Use   Smoking status: Former    Current packs/day: 0.00    Types: Cigarettes    Quit date: 08/04/1984    Years since quitting: 39.7   Smokeless tobacco: Never  Vaping Use   Vaping status: Never Used  Substance and Sexual Activity   Alcohol use: Yes    Alcohol/week: 1.0 standard drink of alcohol    Types: 1 Glasses of wine per week   Drug use: No   Sexual activity: Not on file  Other Topics Concern   Not on file  Social History Narrative   Not on file   Social Drivers of Health   Financial Resource Strain: Low Risk  (  05/27/2021)   Overall Financial Resource Strain (CARDIA)    Difficulty of Paying Living Expenses: Not hard at all  Food Insecurity: Not on file  Transportation Needs: Not on file  Physical Activity: Not on file  Stress: Not on file  Social Connections: Not on file  Intimate Partner Violence: Not on file     Outpatient Medications Prior to Visit  Medication Sig Dispense Refill   atorvastatin  (LIPITOR) 20 MG tablet TAKE ONE TABLET BY MOUTH ONE TIME DAILY 90 tablet 3   azithromycin  (ZITHROMAX ) 500 MG tablet Take 1 tablet (500 mg total) by mouth daily. 7 tablet 0   cyclobenzaprine  (FLEXERIL ) 10 MG tablet Take 1 tablet (10 mg total) by mouth 3 (three) times daily as needed for muscle spasms. 30 tablet 0   EPINEPHrine  0.3 mg/0.3 mL IJ SOAJ injection Inject 0.3 mg into the muscle as needed for anaphylaxis. 1 each 1   etodolac  (LODINE ) 500 MG tablet Take 1  tablet (500 mg total) by mouth 2 (two) times daily. 60 tablet 0   famotidine  (PEPCID ) 20 MG tablet Take 1 tablet (20 mg total) by mouth 2 (two) times daily. 60 tablet 11   levocetirizine (XYZAL) 5 MG tablet Take 5 mg by mouth every evening.     meclizine  (ANTIVERT ) 25 MG tablet Take 1 tablet (25 mg total) by mouth 3 (three) times daily as needed. 90 tablet 0   metFORMIN  (GLUCOPHAGE ) 500 MG tablet TAKE ONE TABLET BY MOUTH TWICE A DAY WITH A MEAL 180 tablet 1   montelukast  (SINGULAIR ) 10 MG tablet TAKE ONE TABLET BY MOUTH ONE TIME DAILY AT BEDTIME 90 tablet 3   MOUNJARO  15 MG/0.5ML Pen INJECT THE CONTENTS OF ONE PEN UNDER THE SKIN WEEKLY ON THE SAME DAY EACH WEEK *REPLACES 12.5 MG DOSE* 6 mL 2   predniSONE  (DELTASONE ) 10 MG tablet 6 tablets on Day 1 , then reduce by 1 tablet daily until gone 21 tablet 0   VENTOLIN  HFA 108 (90 Base) MCG/ACT inhaler Inhale 2 puffs into the lungs every 6 (six) hours as needed for wheezing or shortness of breath. 18 g 0   No facility-administered medications prior to visit.    Allergies  Allergen Reactions   Amoxicillin Anaphylaxis   Aspirin Anaphylaxis   Ibuprofen Anaphylaxis   Penicillins Anaphylaxis   Sulfa Antibiotics Anaphylaxis    ROS Review of Systems Negative unless indicated in HPI.    Objective:    Physical Exam Constitutional:      Appearance: Normal appearance.  HENT:     Head:     Comments: Dix-hallpike test positive Eyes:     General: Lids are normal.  Neurological:     Mental Status: She is alert and oriented to person, place, and time.     Cranial Nerves: No facial asymmetry.     Motor: No weakness.     Gait: Gait normal.     BP 122/76   Pulse 77   Temp 97.9 F (36.6 C)   Ht 5' 7 (1.702 m)   Wt 201 lb 6.4 oz (91.4 kg)   LMP 01/03/2018   SpO2 99%   BMI 31.54 kg/m  Wt Readings from Last 3 Encounters:  04/15/24 214 lb 8.1 oz (97.3 kg)  04/15/24 201 lb 6.4 oz (91.4 kg)  02/17/24 198 lb (89.8 kg)     Health  Maintenance  Topic Date Due   Diabetic kidney evaluation - Urine ACR  Never done   Pneumococcal Vaccine: 50+ Years (2  of 2 - PCV) 12/29/2021   FOOT EXAM  07/10/2023   Mammogram  09/06/2023   Influenza Vaccine  03/04/2024   COVID-19 Vaccine (4 - 2025-26 season) 04/04/2024   HEMOGLOBIN A1C  07/16/2024   OPHTHALMOLOGY EXAM  12/31/2024   Diabetic kidney evaluation - eGFR measurement  01/14/2025   Cervical Cancer Screening (HPV/Pap Cotest)  07/10/2027   DTaP/Tdap/Td (3 - Td or Tdap) 07/09/2032   Colonoscopy  09/12/2032   Hepatitis B Vaccines 19-59 Average Risk  Completed   Hepatitis C Screening  Completed   HIV Screening  Completed   Zoster Vaccines- Shingrix  Completed   HPV VACCINES  Aged Out   Meningococcal B Vaccine  Aged Out   Fecal DNA (Cologuard)  Discontinued    There are no preventive care reminders to display for this patient.  Lab Results  Component Value Date   TSH 2.65 06/10/2023   Lab Results  Component Value Date   WBC 6.6 04/15/2024   HGB 13.4 04/15/2024   HCT 41.6 04/15/2024   MCV 86.8 04/15/2024   PLT 249 04/15/2024   Lab Results  Component Value Date   NA 140 01/15/2024   K 4.8 01/15/2024   CO2 19 (L) 01/15/2024   GLUCOSE 76 01/15/2024   BUN 11 01/15/2024   CREATININE 0.81 01/15/2024   BILITOT 1.1 01/15/2024   ALKPHOS 142 (H) 01/15/2024   AST 27 01/15/2024   ALT 26 01/15/2024   PROT 7.3 01/15/2024   ALBUMIN 4.7 01/15/2024   CALCIUM  9.9 01/15/2024   EGFR 82 01/15/2024   GFR 81.22 06/10/2023   Lab Results  Component Value Date   CHOL 138 01/15/2024   Lab Results  Component Value Date   HDL 57 01/15/2024   Lab Results  Component Value Date   LDLCALC 65 01/15/2024   Lab Results  Component Value Date   TRIG 82 01/15/2024   Lab Results  Component Value Date   CHOLHDL 2.4 01/15/2024   Lab Results  Component Value Date   HGBA1C 5.3 01/15/2024      Assessment & Plan:  Vertigo Assessment & Plan: Vertigo with associated transient  visual disturbance and nausea. Vertigo with head movement and  changing position associated with nausea. Episode in the office with position change. Pt orthostatic on exam. Given pt symptoms and visual disturbance need further evaluation. -Advised pt to go to emergency department for further evaluation and workup.  -[facility] and report given to triage nurse.       Follow-up: No follow-ups on file.   Latisia Hilaire, NP
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
## 2024-04-15 NOTE — ED Triage Notes (Signed)
 C/O vertigo since Sunday. Taking Meclizine  with good effect.  Seen by PCP at [facility] today due to orthostatic hypotension and to have vertigo evaluated.  AAOx3.  Skin warm and dry. NAD>  Meclizine  taken at 1200,  no current symptoms.

## 2024-04-15 NOTE — Discharge Instructions (Addendum)
 I suspect that this is related to vertigo given you report symptoms are worse with certain head movements and better with meclizine  however if you develop symptoms at rest, vomiting, difficulties ambulating or worsening symptoms not relieved with meclizine  you can return to the ER for repeat evaluation.  We also discussed how the vision changes can be worrisome as well.  We discussed doing MRI/CT imaging today to evaluate for stroke, issues with plaque in her carotids but have opted to hold off at this time given this seems most consistent with vertigo and your eye issues had resolved but if you develop a change or worsening symptoms and is better to come in to the emergency room to be evaluated.

## 2024-04-15 NOTE — Assessment & Plan Note (Addendum)
 Vertigo with associated transient visual disturbance and nausea. Vertigo with head movement and  changing position associated with nausea. Episode in the office with position change. Pt orthostatic on exam. Given pt symptoms and visual disturbance need further evaluation. -Advised pt to go to emergency department for further evaluation and workup.  -[facility] ED and report given to triage nurse.

## 2024-04-26 ENCOUNTER — Ambulatory Visit
Admission: RE | Admit: 2024-04-26 | Discharge: 2024-04-26 | Disposition: A | Discharge status: Home / Self Care | Payer: PRIVATE HEALTH INSURANCE | Source: Ambulatory Visit | Attending: Internal Medicine | Admitting: Internal Medicine

## 2024-04-26 DIAGNOSIS — Z1231 Encounter for screening mammogram for malignant neoplasm of breast: Secondary | ICD-10-CM | POA: Insufficient documentation

## 2024-05-09 ENCOUNTER — Other Ambulatory Visit: Payer: Self-pay | Admitting: Internal Medicine

## 2024-05-09 NOTE — Telephone Encounter (Unsigned)
 Copied from CRM #[id number]. Topic: Clinical - Medication Refill >> May 09, 2024 12:20 PM Tanazia G wrote: Medication: MOUNJARO  15 MG/0.5ML Pen Requesting 90 days supply with 1 refill Phone:[phone]  Has the patient contacted their pharmacy? Yes (Agent: If no, request that the patient contact the pharmacy for the refill. If patient does not wish to contact the pharmacy document the reason why and proceed with request.) (Agent: If yes, when and what did the pharmacy advise?)  This is the patient's preferred pharmacy:   [facility] [place], [place] - [address] [place] [id number] Phone: [phone] Fax: [phone]  Is this the correct pharmacy for this prescription? Yes If no, delete pharmacy and type the correct one.   Has the prescription been filled recently? Yes  Is the patient out of the medication? Yes  Has the patient been seen for an appointment in the last year OR does the patient have an upcoming appointment? Yes  Can we respond through MyChart? Yes  Agent: Please be advised that Rx refills may take up to 3 business days. We ask that you follow-up with your pharmacy.

## 2024-05-13 MED ORDER — MOUNJARO 15 MG/0.5ML ~~LOC~~ SOAJ: 15.0000 mg | SUBCUTANEOUS | 2 refills | Status: AC

## 2024-05-16 ENCOUNTER — Other Ambulatory Visit: Payer: Self-pay

## 2024-05-19 ENCOUNTER — Other Ambulatory Visit: Payer: Self-pay

## 2024-05-19 MED ORDER — VENTOLIN HFA 108 (90 BASE) MCG/ACT IN AERS: 2.0000 | INHALATION_SPRAY | Freq: Four times a day (QID) | RESPIRATORY_TRACT | 0 refills | Status: AC | PRN

## 2024-07-14 ENCOUNTER — Other Ambulatory Visit: Payer: Self-pay | Admitting: Internal Medicine

## 2024-07-14 DIAGNOSIS — E669 Obesity, unspecified: Secondary | ICD-10-CM

## 2024-07-26 ENCOUNTER — Other Ambulatory Visit: Payer: Self-pay | Admitting: Internal Medicine
